# Patient Record
Sex: Male | Born: 1965 | Race: Black or African American | Hispanic: No | Marital: Single | Smoking: Current every day smoker
Health system: Southern US, Community
[De-identification: ages and names within clinical notes are randomized; demographics above are authoritative.]

## PROBLEM LIST (undated history)

## (undated) VITALS — BP 153/114 | HR 107 | Temp 98.1°F | Resp 20

## (undated) DIAGNOSIS — K219 Gastro-esophageal reflux disease without esophagitis: Secondary | ICD-10-CM

## (undated) DIAGNOSIS — K759 Inflammatory liver disease, unspecified: Secondary | ICD-10-CM

## (undated) DIAGNOSIS — G510 Bell's palsy: Secondary | ICD-10-CM

## (undated) DIAGNOSIS — E785 Hyperlipidemia, unspecified: Secondary | ICD-10-CM

## (undated) DIAGNOSIS — E860 Dehydration: Secondary | ICD-10-CM

## (undated) DIAGNOSIS — F209 Schizophrenia, unspecified: Secondary | ICD-10-CM

## (undated) DIAGNOSIS — N179 Acute kidney failure, unspecified: Secondary | ICD-10-CM

## (undated) DIAGNOSIS — F329 Major depressive disorder, single episode, unspecified: Secondary | ICD-10-CM

## (undated) DIAGNOSIS — J45909 Unspecified asthma, uncomplicated: Secondary | ICD-10-CM

## (undated) DIAGNOSIS — I10 Essential (primary) hypertension: Secondary | ICD-10-CM

## (undated) DIAGNOSIS — M1990 Unspecified osteoarthritis, unspecified site: Secondary | ICD-10-CM

## (undated) DIAGNOSIS — W3400XA Accidental discharge from unspecified firearms or gun, initial encounter: Secondary | ICD-10-CM

## (undated) DIAGNOSIS — F32A Depression, unspecified: Secondary | ICD-10-CM

## (undated) DIAGNOSIS — F319 Bipolar disorder, unspecified: Secondary | ICD-10-CM

## (undated) DIAGNOSIS — R112 Nausea with vomiting, unspecified: Secondary | ICD-10-CM

## (undated) DIAGNOSIS — S0990XA Unspecified injury of head, initial encounter: Secondary | ICD-10-CM

## (undated) DIAGNOSIS — T7840XA Allergy, unspecified, initial encounter: Secondary | ICD-10-CM

## (undated) DIAGNOSIS — F419 Anxiety disorder, unspecified: Secondary | ICD-10-CM

## (undated) DIAGNOSIS — G8929 Other chronic pain: Secondary | ICD-10-CM

## (undated) DIAGNOSIS — E119 Type 2 diabetes mellitus without complications: Secondary | ICD-10-CM

## (undated) HISTORY — PX: LEG SURGERY: SHX1003

## (undated) HISTORY — PX: HIP SURGERY: SHX245

## (undated) HISTORY — DX: Bell's palsy: G51.0

## (undated) HISTORY — DX: Gastro-esophageal reflux disease without esophagitis: K21.9

## (undated) HISTORY — DX: Acute kidney failure, unspecified: N17.9

## (undated) HISTORY — PX: COLONOSCOPY: SHX174

## (undated) HISTORY — DX: Nausea with vomiting, unspecified: R11.2

## (undated) HISTORY — DX: Type 2 diabetes mellitus without complications: E11.9

## (undated) HISTORY — DX: Allergy, unspecified, initial encounter: T78.40XA

## (undated) HISTORY — PX: HAND SURGERY: SHX662

## (undated) HISTORY — PX: ORTHOPEDIC SURGERY: SHX850

## (undated) HISTORY — DX: Dehydration: E86.0

---

## 1998-04-19 ENCOUNTER — Other Ambulatory Visit: Admission: RE | Admit: 1998-04-19 | Discharge: 1998-04-19 | Payer: Self-pay | Admitting: Orthopedic Surgery

## 1998-04-23 ENCOUNTER — Inpatient Hospital Stay (HOSPITAL_COMMUNITY): Admission: RE | Admit: 1998-04-23 | Discharge: 1998-04-26 | Payer: Self-pay | Admitting: Orthopedic Surgery

## 1998-07-08 ENCOUNTER — Emergency Department (HOSPITAL_COMMUNITY): Admission: EM | Admit: 1998-07-08 | Discharge: 1998-07-08 | Payer: Self-pay

## 2000-05-23 ENCOUNTER — Inpatient Hospital Stay (HOSPITAL_COMMUNITY): Admission: EM | Admit: 2000-05-23 | Discharge: 2000-05-29 | Payer: Self-pay

## 2000-09-16 ENCOUNTER — Inpatient Hospital Stay (HOSPITAL_COMMUNITY): Admission: EM | Admit: 2000-09-16 | Discharge: 2000-09-25 | Payer: Self-pay | Admitting: *Deleted

## 2000-10-22 ENCOUNTER — Emergency Department (HOSPITAL_COMMUNITY): Admission: EM | Admit: 2000-10-22 | Discharge: 2000-10-23 | Payer: Self-pay | Admitting: Emergency Medicine

## 2001-01-31 ENCOUNTER — Emergency Department (HOSPITAL_COMMUNITY): Admission: EM | Admit: 2001-01-31 | Discharge: 2001-01-31 | Payer: Self-pay | Admitting: Emergency Medicine

## 2001-03-01 ENCOUNTER — Emergency Department (HOSPITAL_COMMUNITY): Admission: EM | Admit: 2001-03-01 | Discharge: 2001-03-01 | Payer: Self-pay | Admitting: Emergency Medicine

## 2001-03-09 ENCOUNTER — Emergency Department (HOSPITAL_COMMUNITY): Admission: EM | Admit: 2001-03-09 | Discharge: 2001-03-09 | Payer: Self-pay | Admitting: *Deleted

## 2001-11-27 ENCOUNTER — Emergency Department (HOSPITAL_COMMUNITY): Admission: EM | Admit: 2001-11-27 | Discharge: 2001-11-27 | Payer: Self-pay | Admitting: Emergency Medicine

## 2001-12-13 ENCOUNTER — Emergency Department (HOSPITAL_COMMUNITY): Admission: EM | Admit: 2001-12-13 | Discharge: 2001-12-13 | Payer: Self-pay

## 2001-12-19 ENCOUNTER — Emergency Department (HOSPITAL_COMMUNITY): Admission: EM | Admit: 2001-12-19 | Discharge: 2001-12-19 | Payer: Self-pay | Admitting: Emergency Medicine

## 2002-01-19 ENCOUNTER — Emergency Department (HOSPITAL_COMMUNITY): Admission: EM | Admit: 2002-01-19 | Discharge: 2002-01-19 | Payer: Self-pay | Admitting: Emergency Medicine

## 2002-02-23 ENCOUNTER — Emergency Department (HOSPITAL_COMMUNITY): Admission: EM | Admit: 2002-02-23 | Discharge: 2002-02-23 | Payer: Self-pay | Admitting: Emergency Medicine

## 2002-03-31 ENCOUNTER — Encounter: Payer: Self-pay | Admitting: Emergency Medicine

## 2002-03-31 ENCOUNTER — Emergency Department (HOSPITAL_COMMUNITY): Admission: EM | Admit: 2002-03-31 | Discharge: 2002-03-31 | Payer: Self-pay

## 2002-04-05 ENCOUNTER — Emergency Department (HOSPITAL_COMMUNITY): Admission: EM | Admit: 2002-04-05 | Discharge: 2002-04-05 | Payer: Self-pay | Admitting: Emergency Medicine

## 2002-04-06 ENCOUNTER — Emergency Department (HOSPITAL_COMMUNITY): Admission: EM | Admit: 2002-04-06 | Discharge: 2002-04-06 | Payer: Self-pay | Admitting: Emergency Medicine

## 2002-04-07 ENCOUNTER — Emergency Department (HOSPITAL_COMMUNITY): Admission: EM | Admit: 2002-04-07 | Discharge: 2002-04-07 | Payer: Self-pay

## 2002-04-21 ENCOUNTER — Encounter
Admission: RE | Admit: 2002-04-21 | Discharge: 2002-06-13 | Payer: Self-pay | Admitting: Physical Medicine and Rehabilitation

## 2002-06-05 ENCOUNTER — Ambulatory Visit (HOSPITAL_COMMUNITY): Admission: RE | Admit: 2002-06-05 | Discharge: 2002-06-05 | Payer: Self-pay | Admitting: Orthopedic Surgery

## 2002-06-10 ENCOUNTER — Encounter: Payer: Self-pay | Admitting: Emergency Medicine

## 2002-06-10 ENCOUNTER — Emergency Department (HOSPITAL_COMMUNITY): Admission: EM | Admit: 2002-06-10 | Discharge: 2002-06-11 | Payer: Self-pay | Admitting: Emergency Medicine

## 2002-08-25 ENCOUNTER — Emergency Department (HOSPITAL_COMMUNITY): Admission: EM | Admit: 2002-08-25 | Discharge: 2002-08-25 | Payer: Self-pay | Admitting: *Deleted

## 2002-09-10 ENCOUNTER — Emergency Department (HOSPITAL_COMMUNITY): Admission: EM | Admit: 2002-09-10 | Discharge: 2002-09-10 | Payer: Self-pay | Admitting: Emergency Medicine

## 2002-09-18 ENCOUNTER — Emergency Department (HOSPITAL_COMMUNITY): Admission: EM | Admit: 2002-09-18 | Discharge: 2002-09-18 | Payer: Self-pay | Admitting: Emergency Medicine

## 2002-10-09 ENCOUNTER — Encounter: Admission: RE | Admit: 2002-10-09 | Discharge: 2003-01-07 | Payer: Self-pay

## 2002-11-11 ENCOUNTER — Emergency Department (HOSPITAL_COMMUNITY): Admission: EM | Admit: 2002-11-11 | Discharge: 2002-11-11 | Payer: Self-pay | Admitting: Emergency Medicine

## 2002-11-11 ENCOUNTER — Encounter: Payer: Self-pay | Admitting: Emergency Medicine

## 2002-11-12 ENCOUNTER — Ambulatory Visit (HOSPITAL_BASED_OUTPATIENT_CLINIC_OR_DEPARTMENT_OTHER): Admission: RE | Admit: 2002-11-12 | Discharge: 2002-11-12 | Payer: Self-pay | Admitting: *Deleted

## 2002-11-15 ENCOUNTER — Emergency Department (HOSPITAL_COMMUNITY): Admission: EM | Admit: 2002-11-15 | Discharge: 2002-11-15 | Payer: Self-pay | Admitting: Emergency Medicine

## 2002-12-03 ENCOUNTER — Emergency Department (HOSPITAL_COMMUNITY): Admission: EM | Admit: 2002-12-03 | Discharge: 2002-12-03 | Payer: Self-pay | Admitting: Emergency Medicine

## 2002-12-06 ENCOUNTER — Emergency Department (HOSPITAL_COMMUNITY): Admission: EM | Admit: 2002-12-06 | Discharge: 2002-12-06 | Payer: Self-pay | Admitting: Emergency Medicine

## 2002-12-06 ENCOUNTER — Encounter: Payer: Self-pay | Admitting: Emergency Medicine

## 2002-12-10 ENCOUNTER — Ambulatory Visit (HOSPITAL_BASED_OUTPATIENT_CLINIC_OR_DEPARTMENT_OTHER): Admission: RE | Admit: 2002-12-10 | Discharge: 2002-12-10 | Payer: Self-pay | Admitting: *Deleted

## 2002-12-15 ENCOUNTER — Emergency Department (HOSPITAL_COMMUNITY): Admission: EM | Admit: 2002-12-15 | Discharge: 2002-12-15 | Payer: Self-pay

## 2002-12-19 ENCOUNTER — Emergency Department (HOSPITAL_COMMUNITY): Admission: EM | Admit: 2002-12-19 | Discharge: 2002-12-19 | Payer: Self-pay | Admitting: Emergency Medicine

## 2002-12-30 ENCOUNTER — Emergency Department (HOSPITAL_COMMUNITY): Admission: EM | Admit: 2002-12-30 | Discharge: 2002-12-30 | Payer: Self-pay | Admitting: Emergency Medicine

## 2003-01-05 ENCOUNTER — Emergency Department (HOSPITAL_COMMUNITY): Admission: EM | Admit: 2003-01-05 | Discharge: 2003-01-05 | Payer: Self-pay | Admitting: Emergency Medicine

## 2003-01-30 ENCOUNTER — Emergency Department (HOSPITAL_COMMUNITY): Admission: EM | Admit: 2003-01-30 | Discharge: 2003-01-30 | Payer: Self-pay | Admitting: Emergency Medicine

## 2003-02-16 ENCOUNTER — Emergency Department (HOSPITAL_COMMUNITY): Admission: EM | Admit: 2003-02-16 | Discharge: 2003-02-17 | Payer: Self-pay | Admitting: Emergency Medicine

## 2003-03-04 ENCOUNTER — Emergency Department (HOSPITAL_COMMUNITY): Admission: EM | Admit: 2003-03-04 | Discharge: 2003-03-05 | Payer: Self-pay | Admitting: Emergency Medicine

## 2003-03-05 ENCOUNTER — Encounter: Payer: Self-pay | Admitting: *Deleted

## 2003-03-06 ENCOUNTER — Emergency Department (HOSPITAL_COMMUNITY): Admission: EM | Admit: 2003-03-06 | Discharge: 2003-03-06 | Payer: Self-pay | Admitting: Emergency Medicine

## 2003-05-06 ENCOUNTER — Encounter: Payer: Self-pay | Admitting: Emergency Medicine

## 2003-05-06 ENCOUNTER — Emergency Department (HOSPITAL_COMMUNITY): Admission: EM | Admit: 2003-05-06 | Discharge: 2003-05-06 | Payer: Self-pay | Admitting: Emergency Medicine

## 2003-05-09 ENCOUNTER — Emergency Department (HOSPITAL_COMMUNITY): Admission: EM | Admit: 2003-05-09 | Discharge: 2003-05-09 | Payer: Self-pay | Admitting: Emergency Medicine

## 2003-06-14 ENCOUNTER — Emergency Department (HOSPITAL_COMMUNITY): Admission: EM | Admit: 2003-06-14 | Discharge: 2003-06-14 | Payer: Self-pay | Admitting: Emergency Medicine

## 2003-07-12 ENCOUNTER — Emergency Department (HOSPITAL_COMMUNITY): Admission: EM | Admit: 2003-07-12 | Discharge: 2003-07-12 | Payer: Self-pay | Admitting: Emergency Medicine

## 2003-07-12 ENCOUNTER — Encounter: Payer: Self-pay | Admitting: Emergency Medicine

## 2003-07-13 ENCOUNTER — Encounter: Payer: Self-pay | Admitting: Emergency Medicine

## 2003-07-13 ENCOUNTER — Emergency Department (HOSPITAL_COMMUNITY): Admission: EM | Admit: 2003-07-13 | Discharge: 2003-07-13 | Payer: Self-pay | Admitting: Emergency Medicine

## 2003-09-04 ENCOUNTER — Emergency Department (HOSPITAL_COMMUNITY): Admission: EM | Admit: 2003-09-04 | Discharge: 2003-09-04 | Payer: Self-pay | Admitting: Emergency Medicine

## 2003-09-16 ENCOUNTER — Encounter: Payer: Self-pay | Admitting: Emergency Medicine

## 2003-09-16 ENCOUNTER — Emergency Department (HOSPITAL_COMMUNITY): Admission: EM | Admit: 2003-09-16 | Discharge: 2003-09-16 | Payer: Self-pay | Admitting: Emergency Medicine

## 2003-10-04 ENCOUNTER — Encounter: Payer: Self-pay | Admitting: Family Medicine

## 2003-10-04 ENCOUNTER — Emergency Department (HOSPITAL_COMMUNITY): Admission: EM | Admit: 2003-10-04 | Discharge: 2003-10-04 | Payer: Self-pay | Admitting: Emergency Medicine

## 2003-10-17 ENCOUNTER — Emergency Department (HOSPITAL_COMMUNITY): Admission: EM | Admit: 2003-10-17 | Discharge: 2003-10-17 | Payer: Self-pay | Admitting: Emergency Medicine

## 2003-12-04 ENCOUNTER — Emergency Department (HOSPITAL_COMMUNITY): Admission: AD | Admit: 2003-12-04 | Discharge: 2003-12-04 | Payer: Self-pay | Admitting: Family Medicine

## 2004-01-23 ENCOUNTER — Emergency Department (HOSPITAL_COMMUNITY): Admission: EM | Admit: 2004-01-23 | Discharge: 2004-01-23 | Payer: Self-pay | Admitting: Emergency Medicine

## 2004-01-31 ENCOUNTER — Emergency Department (HOSPITAL_COMMUNITY): Admission: EM | Admit: 2004-01-31 | Discharge: 2004-01-31 | Payer: Self-pay | Admitting: Emergency Medicine

## 2004-02-22 ENCOUNTER — Emergency Department (HOSPITAL_COMMUNITY): Admission: EM | Admit: 2004-02-22 | Discharge: 2004-02-22 | Payer: Self-pay | Admitting: Emergency Medicine

## 2004-06-12 ENCOUNTER — Emergency Department (HOSPITAL_COMMUNITY): Admission: EM | Admit: 2004-06-12 | Discharge: 2004-06-12 | Payer: Self-pay | Admitting: Family Medicine

## 2004-07-25 ENCOUNTER — Emergency Department (HOSPITAL_COMMUNITY): Admission: EM | Admit: 2004-07-25 | Discharge: 2004-07-25 | Payer: Self-pay | Admitting: Emergency Medicine

## 2004-09-06 ENCOUNTER — Ambulatory Visit: Payer: Self-pay | Admitting: Internal Medicine

## 2004-10-31 ENCOUNTER — Emergency Department (HOSPITAL_COMMUNITY): Admission: EM | Admit: 2004-10-31 | Discharge: 2004-10-31 | Payer: Self-pay | Admitting: Emergency Medicine

## 2004-12-28 ENCOUNTER — Emergency Department (HOSPITAL_COMMUNITY): Admission: EM | Admit: 2004-12-28 | Discharge: 2004-12-28 | Payer: Self-pay | Admitting: Emergency Medicine

## 2005-01-10 ENCOUNTER — Emergency Department (HOSPITAL_COMMUNITY): Admission: EM | Admit: 2005-01-10 | Discharge: 2005-01-10 | Payer: Self-pay | Admitting: Emergency Medicine

## 2005-01-15 ENCOUNTER — Emergency Department (HOSPITAL_COMMUNITY): Admission: EM | Admit: 2005-01-15 | Discharge: 2005-01-15 | Payer: Self-pay | Admitting: Emergency Medicine

## 2005-02-02 ENCOUNTER — Ambulatory Visit: Payer: Self-pay | Admitting: Family Medicine

## 2005-03-14 ENCOUNTER — Ambulatory Visit: Payer: Self-pay | Admitting: Internal Medicine

## 2005-03-21 ENCOUNTER — Ambulatory Visit: Payer: Self-pay | Admitting: Internal Medicine

## 2005-04-05 ENCOUNTER — Ambulatory Visit: Payer: Self-pay | Admitting: Internal Medicine

## 2005-06-02 ENCOUNTER — Emergency Department (HOSPITAL_COMMUNITY): Admission: EM | Admit: 2005-06-02 | Discharge: 2005-06-02 | Payer: Self-pay | Admitting: Emergency Medicine

## 2005-06-06 ENCOUNTER — Emergency Department (HOSPITAL_COMMUNITY): Admission: EM | Admit: 2005-06-06 | Discharge: 2005-06-06 | Payer: Self-pay | Admitting: Emergency Medicine

## 2005-06-29 ENCOUNTER — Ambulatory Visit: Payer: Self-pay | Admitting: Internal Medicine

## 2005-07-02 ENCOUNTER — Emergency Department (HOSPITAL_COMMUNITY): Admission: EM | Admit: 2005-07-02 | Discharge: 2005-07-02 | Payer: Self-pay | Admitting: Emergency Medicine

## 2005-08-17 ENCOUNTER — Ambulatory Visit (HOSPITAL_BASED_OUTPATIENT_CLINIC_OR_DEPARTMENT_OTHER): Admission: RE | Admit: 2005-08-17 | Discharge: 2005-08-17 | Payer: Self-pay | Admitting: Orthopedic Surgery

## 2005-08-17 ENCOUNTER — Ambulatory Visit (HOSPITAL_COMMUNITY): Admission: RE | Admit: 2005-08-17 | Discharge: 2005-08-17 | Payer: Self-pay | Admitting: Orthopedic Surgery

## 2005-08-26 ENCOUNTER — Emergency Department (HOSPITAL_COMMUNITY): Admission: EM | Admit: 2005-08-26 | Discharge: 2005-08-26 | Payer: Self-pay | Admitting: Emergency Medicine

## 2005-08-28 ENCOUNTER — Emergency Department (HOSPITAL_COMMUNITY): Admission: EM | Admit: 2005-08-28 | Discharge: 2005-08-28 | Payer: Self-pay | Admitting: Emergency Medicine

## 2005-08-29 ENCOUNTER — Emergency Department (HOSPITAL_COMMUNITY): Admission: EM | Admit: 2005-08-29 | Discharge: 2005-08-29 | Payer: Self-pay | Admitting: Emergency Medicine

## 2005-12-04 ENCOUNTER — Ambulatory Visit: Payer: Self-pay | Admitting: Internal Medicine

## 2006-01-26 ENCOUNTER — Emergency Department (HOSPITAL_COMMUNITY): Admission: EM | Admit: 2006-01-26 | Discharge: 2006-01-26 | Payer: Self-pay | Admitting: Emergency Medicine

## 2006-03-30 ENCOUNTER — Ambulatory Visit: Payer: Self-pay | Admitting: Family Medicine

## 2006-05-14 ENCOUNTER — Emergency Department (HOSPITAL_COMMUNITY): Admission: EM | Admit: 2006-05-14 | Discharge: 2006-05-14 | Payer: Self-pay | Admitting: Emergency Medicine

## 2007-02-12 ENCOUNTER — Emergency Department (HOSPITAL_COMMUNITY): Admission: EM | Admit: 2007-02-12 | Discharge: 2007-02-12 | Payer: Self-pay | Admitting: Emergency Medicine

## 2007-02-17 ENCOUNTER — Inpatient Hospital Stay (HOSPITAL_COMMUNITY): Admission: EM | Admit: 2007-02-17 | Discharge: 2007-02-22 | Payer: Self-pay | Admitting: Emergency Medicine

## 2007-07-11 ENCOUNTER — Ambulatory Visit: Payer: Self-pay | Admitting: Internal Medicine

## 2007-09-02 DIAGNOSIS — M545 Low back pain, unspecified: Secondary | ICD-10-CM | POA: Insufficient documentation

## 2007-09-02 DIAGNOSIS — F1110 Opioid abuse, uncomplicated: Secondary | ICD-10-CM | POA: Insufficient documentation

## 2007-09-02 DIAGNOSIS — F209 Schizophrenia, unspecified: Secondary | ICD-10-CM | POA: Insufficient documentation

## 2007-09-02 DIAGNOSIS — K219 Gastro-esophageal reflux disease without esophagitis: Secondary | ICD-10-CM | POA: Insufficient documentation

## 2007-09-02 DIAGNOSIS — I10 Essential (primary) hypertension: Secondary | ICD-10-CM | POA: Insufficient documentation

## 2007-09-02 DIAGNOSIS — G8929 Other chronic pain: Secondary | ICD-10-CM | POA: Insufficient documentation

## 2007-12-04 ENCOUNTER — Emergency Department (HOSPITAL_COMMUNITY): Admission: EM | Admit: 2007-12-04 | Discharge: 2007-12-04 | Payer: Self-pay | Admitting: Emergency Medicine

## 2008-06-16 ENCOUNTER — Emergency Department (HOSPITAL_COMMUNITY): Admission: EM | Admit: 2008-06-16 | Discharge: 2008-06-16 | Payer: Self-pay | Admitting: Family Medicine

## 2008-09-05 ENCOUNTER — Emergency Department (HOSPITAL_COMMUNITY): Admission: EM | Admit: 2008-09-05 | Discharge: 2008-09-05 | Payer: Self-pay | Admitting: Family Medicine

## 2008-12-22 ENCOUNTER — Emergency Department (HOSPITAL_COMMUNITY): Admission: EM | Admit: 2008-12-22 | Discharge: 2008-12-22 | Payer: Self-pay | Admitting: Family Medicine

## 2009-03-17 ENCOUNTER — Emergency Department (HOSPITAL_COMMUNITY): Admission: EM | Admit: 2009-03-17 | Discharge: 2009-03-17 | Payer: Self-pay | Admitting: Family Medicine

## 2009-10-12 ENCOUNTER — Emergency Department (HOSPITAL_COMMUNITY): Admission: EM | Admit: 2009-10-12 | Discharge: 2009-10-12 | Payer: Self-pay | Admitting: Emergency Medicine

## 2010-06-15 ENCOUNTER — Emergency Department (HOSPITAL_COMMUNITY): Admission: EM | Admit: 2010-06-15 | Discharge: 2010-06-15 | Payer: Self-pay | Admitting: Family Medicine

## 2010-07-23 ENCOUNTER — Emergency Department (HOSPITAL_COMMUNITY): Admission: EM | Admit: 2010-07-23 | Discharge: 2010-07-23 | Payer: Self-pay | Admitting: Internal Medicine

## 2010-10-18 ENCOUNTER — Emergency Department (HOSPITAL_COMMUNITY): Admission: EM | Admit: 2010-10-18 | Discharge: 2010-10-18 | Payer: Self-pay | Admitting: Emergency Medicine

## 2010-12-25 ENCOUNTER — Emergency Department (HOSPITAL_COMMUNITY)
Admission: EM | Admit: 2010-12-25 | Discharge: 2010-12-25 | Payer: Self-pay | Source: Home / Self Care | Admitting: Emergency Medicine

## 2011-04-15 ENCOUNTER — Inpatient Hospital Stay (INDEPENDENT_AMBULATORY_CARE_PROVIDER_SITE_OTHER)
Admission: RE | Admit: 2011-04-15 | Discharge: 2011-04-15 | Disposition: A | Discharge status: Home / Self Care | Payer: Medicare Other | Source: Ambulatory Visit | Attending: Family Medicine | Admitting: Family Medicine

## 2011-04-15 DIAGNOSIS — B029 Zoster without complications: Secondary | ICD-10-CM

## 2011-05-12 NOTE — Discharge Summary (Signed)
NAMEMOE, YONAMINE           ACCOUNT NO.:  [id number]   MEDICAL RECORD NO.:  [id number]          PATIENT TYPE:  INP   LOCATION:  5002                         FACILITY:  [facility]   PHYSICIAN:  Newt Minion, MD     DATE OF BIRTH:  [birth year]   DATE OF ADMISSION:  02/17/2007  DATE OF DISCHARGE:  02/22/2007                               DISCHARGE SUMMARY   FINAL DIAGNOSIS:  Methicillin-resistant Staphylococcus aureus abscess,  community-acquired, dorsal aspect right hand.   PROCEDURE:  1. Irrigation and debridement of abscess.  2. Placement of IV vancomycin antibiotics.   Discharged to home in stable condition with prescriptions for Tylox for  pain, doxycycline to continue with the antibiotic coverage, and  instructions for wound care.   HISTORY OF PRESENT ILLNESS:  The patient is a 45 year old gentleman who  developed an abscess on the dorsal aspect of his right hand, unknown  mechanism.  He denied fighting but was in the emergency room several  days ago due the same infection in his hand.  He was given a  prescription for Keflex but did not fill this.  He presented at this  time with a massive abscess with necrotic skin dorsally on the right  hand without involvement of the flexor tendons.  The patient was  urgently brought to the operating room and underwent irrigation and  debridement of the massive abscess to his right hand.  Postoperatively,  the patient was placed on clindamycin and Zosyn.  He was started with  occupational therapy with pulse lavage daily to the right hand.  Cultures were pending.  He did receive a psych consult due to suicidal  ideation.  His drug screen was positive for benzodiazepines, cocaine and  opiates.  The patient's wound progressed nicely.  His cultures were  ultimately positive for MRSA and the patient was changed from his  current medications to IV vancomycin.  He was kept on IV vancomycin  throughout his hospital stay and then was discharged  to home with  doxycycline with followup in the office in 2 weeks.      Newt Minion, MD  Electronically Signed     MVD/MEDQ  D:  04/11/2007  T:  04/11/2007  Job:  [id number]
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
## 2011-05-12 NOTE — Op Note (Signed)
[facility]  Patient:    Jacob [name], Jacob [name] Visit Number: [id number] MRN: [id number]          Service Type: DSU Location: DAY Attending Physician:  Augustin Schooling. Dictated by:   Esmond Plants, M.D. Proc. Date: 06/05/02 Admit Date:  06/05/2002 Discharge Date: 06/05/2002                             Operative Report  PREOPERATIVE DIAGNOSIS:  Left knee anterior horn lateral meniscus tear.  POSTOPERATIVE DIAGNOSIS:  Left knee anterior horn lateral meniscus tear.  PROCEDURE:  Left knee diagnostic arthroscopy with debridement of anterior horn lateral meniscus tear.  SURGEON:  Esmond Plants, M.D.  FIRST ASSISTANT:  None.  ANESTHESIA:  Local anesthesia plus MAC.  ESTIMATED BLOOD LOSS:  Zero.  TOURNIQUET TIME:  Zero.  FLUID REPLACED:  800 cc crystalloid.  INSTRUMENT COUNT:  Correct.  COMPLICATIONS:  None.  Perioperative antibiotics were given.  INDICATIONS FOR PROCEDURE:  The patient is a 45 year old male who presents complaining of anterolateral left knee pain. The patient denies history of specific trauma related to the knee. He has failed a conservative course of outpatient physical therapy and presents with signs and symptoms consistent with a lateral meniscus tear and MRI which shows an anterior horn tear. After counseling the patient regarding the options for management to include continued conservative management versus surgical evaluation and treatment, he would like to proceed with surgery. Informed consent was signed and on the chart.  DESCRIPTION OF PROCEDURE:  After an adequate level of anesthesia was achieved and 1 gm of Ancef was given preoperatively, the patient was positioned supine on the operating room table, a nonsterile tourniquet was placed on the left proximal thigh. The left leg was then prepped and draped in its entirety in the usual sterile fashion. A lateral post was utilized. Diagnostic arthroscopy was  performed through standard arthroscopic portals, superolateral outflow, anterolateral scope, and anteromedial working portals were all created in a similar fashion with infiltration of 0.5% Marcaine with epinephrine followed by incision with an 11 blade scalpel and introduced of the cannula into the joint using blunt obturators. Diagnostic arthroscopy revealed normal patellofemoral chondral surfaces. The medial and lateral gutters were free of loose bodies. There was no medial plica noted. The popliteus was visualized and noted to be intact. The scope was then entered into the medial compartment and there was noted to be no chondromalacia noted on the femoral condyle. The tibial cartilage had some grade 1 softening. The medial meniscus was normal in its entirety. The ACL was visualized and noted to be intact. There was hypertrophic synovium present within the notch which was debrided including the ligamentum mucosum. At this point, the scope was able to visualize a complex anterior horn lateral meniscus tear. This was debrided back to stable meniscal tissue using biting instruments and a full range resector. Approximately half the anterior horn of the lateral meniscus had to be removed. The remainder of the posterior horn and meniscus was intact. There was some grade 2 and grade 3 chondromalacia noted on the lateral femoral condyle and the lateral tibial plateau. There was no evidence of full thickness wear and there was still felt to be an adequate thickness of cartilage in that compartment. The ACL was also visualized and noted to be intact. After completion of the lateral meniscal debridement, the knee was thoroughly irrigated and then the wound was closed using 4-0 monocryl, Steri-Strips  were applied followed by a sterile dressing. The patient tolerated the procedure well and was taken to PACU in stable condition. Dictated by:   Esmond Plants, M.D. Attending Physician:  Esmond Plants  R. DD:  06/05/02 TD:  06/07/02 Job: [id number]
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
## 2011-05-12 NOTE — Op Note (Signed)
NAME:  Jacob [name], Jacob [name]                     ACCOUNT NO.:  [id number]   MEDICAL RECORD NO.:  [id number]                   PATIENT TYPE:  AMB   LOCATION:  DSC                                  FACILITY:  [facility]   PHYSICIAN:  Daryl Eastern, M.D.      DATE OF BIRTH:  [birth year]   DATE OF PROCEDURE:  DATE OF DISCHARGE:                                 OPERATIVE REPORT   PREOPERATIVE DIAGNOSIS:  Comminuted intra-articular fracture, base of the  left first metacarpal.   POSTOPERATIVE DIAGNOSIS:  Comminuted intra-articular fracture, base of the  left first metacarpal.   OPERATION PERFORMED:  Open reduction and internal fixation, left first  metacarpal.   SURGEON:  Daryl Eastern, M.D.   ANESTHESIA:  General.   OPERATIVE FINDINGS:  The patient had significant callous formation with  malunited fracture.  The fracture was four weeks old.   DESCRIPTION OF OPERATION:  Under general anesthesia with a tourniquet on the  left arm, the left hand was prepped and draped in the usual fashion.  After  exsanguinating the limb the tourniquet was inflated to 250 mmHg.  A  longitudinal incision was made along the radial border of the first  metacarpal and then extended transversely over the distal wrist crease.  Sharp dissection was carried through the subcutaneous tissues and bleeding  points were coagulated.  Sharp dissection was carried down just radial to  the APL tendon and the thenar muscles were elevated from the base of the  first metacarpal.  A fire spring retractor was inserted for retraction.   The fracture site was identified and the [facility] joint was opened.  There was a  small defect on the trapezium.  The articular surface basically was smooth,  except for the small defect.  The fracture site was then taken down with a  Soil scientist and involved a significant position of the articular surface.  The fracture was then reduced to the first metacarpal realigning the  trapezium metacarpal joint.  There was an ulnar fragment that was involving  about 10% of the articular surface.  This fragment was reduced to the  fracture fragment and held with a temporary K wire and then the main  fragment was reduced to the shaft of the first metacarpal, and held with a  clamp.  The mini fragment set was used and the large fragment was repaired  to the shaft with 2.5 mm screw times two, lagging it from radial to ulnar.  This showed good alignment under x-ray control.  The articular fragment was  then again reduced to make the joint surface smooth and a 28 K wire was  passed from ulnar to radial across the fragment and out through the skin.  The pin was bent over and left protruding from the skin.  X-ray showed good  alignment.   The wound was irrigated copiously with saline.  The muscle and fascia were  repaired with 4-0 Vicryl.  The subcutaneous tissue was  closed with 4-0  Vicryl and the skin with a 3-0 subcuticular Prolene.  Steri-strips were  applied followed by sterile dressings and a thumb spica splint.  The  tourniquet was released with good circulation of the hand.   The patient went to the recovery room awake in stable and good condition.                                                 Daryl Eastern, M.D.    EMM/MEDQ  D:  12/10/2002  T:  12/11/2002  Job:  [id number]
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
## 2011-05-12 NOTE — Discharge Summary (Signed)
[facility]  Patient:    Jacob [name], Jacob [name]                    MRN: [id number] Adm. Date:  [id number] Disc. Date: [id number] Attending:  Bess Harvest                           Discharge Summary  INTRODUCTION:  Jachai Kenner is a 45 year old black male who was admitted on voluntary papers for detoxification from alcohol.  He was also complaining of hearing voices telling him to rob people.  The patient has a long history of hallucinatory experience and almost continuous substance abuse for years. he has also a history of antisocial behavior.  HOSPITAL COURSE:  After admission to the ward the patient was started on low dose Librium protocol.  He was started on Zyprexa 5 mg in the morning, 10 mg at bedtime and Neurontin 400 mg in the morning and 800 mg at bedtime.  On September 17, 2000 I added Celexa because of some depressive symptoms, 10 mg daily.  Dr. Lajuana Ripple who saw the patient on call started him on Remeron at bedtime because of insomnia and started him on Depakote ER 1000 mg at bedtime with valproic acid level scheduled September 20, 2000.  He was still psychotic on September 18, 2000 and hearing voices.  Zyprexa was increased to 20 mg daily and Geodon was introduced 20 mg in the morning and 40 mg at bedtime. The patient did not tolerate Geodon in this dose well and Zyprexa was subsequently increased to 30 mg daily and Geodon decreased to 20 mg at bedtime.  Because of breaks in anxiety I started him on Klonopin.  The patient was started on September 21, 2000 on Procardia because of elevation of blood pressure.  At one point the patient started developing threats to other patients, going to other patients rooms and stealing things.  He was placed on one-to-one observation.  The patient complained that Depakote made him confused and Dr. Dola Argyle, who was on call discontinued his Depakote.  On September 24, 2000 the patients Neurontin was restarrted and  I started him on Zoloft 25 mg daily.  I discontinued the Librium and discontinued one-to-one observation. On September 25, 2000 the patient felt free from hallucination, delusions or dangerous ideations and was able to promise safety and sobriety.  His girlfriend was able to take him home.  He claimed responsibility for his stealing on the unit.  There were no side effects on medication.  MEDICAL PROBLEMS:  As mentioned before, the patient ran an elevated blood pressure which was helped with hydrochlorothiazide.  His blood pressure on the day of discharge was 142/80.  Other vital signs were stable.  LABORATORY DATA:  Review of blood work showed valproic acid on September 20, 2000 50, normal level of folate and B12 and normal CBC and differential and slight elevation of thyroid uptake at 46.9 with normal TSH and T4.  DISCHARGE DIAGNOSES: Axis I:    1. Schizoaffective disorder.            2. Alcohol dependence.            3. Polysubstance abuse. 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 Axis III:  1. Hypertension.            2. Upper respiratory tract infection, improved.  Axis IV:   Psychosocial stressors severe, medical problems and social            problems as well as substance abuse. Axis V:    Global assessment of functioning upon admission 35, maximum for            past year 60; global assessment of functioning upon discharge 50.  DISCHARGE RECOMMENDATION:  The patient was discharged to [facility] residential treatment program and he is supposed to have an appointment with mental health after discharge from the program.  DISCHARGE MEDICATIONS:  Zyprexa 2 tablets at bedtime, Zyprexa 1 table tin the morning and 1 tablet at noon, Neurontin 100 mg three times a day, Zoloft 25 mg daily, Remeron 15 mg at bedtime, Celexa 40 mg daily, hydrochlorothiazide 25 mg daily, Klonopin 0.5 mg twice a day, Geodon 20 mg at bedtime.  CONDITION ON  DISCHARGE:  The patient was discharged in good condition and prior to discharge he was instructed about side effects of medication. DD:  10/07/00 TD:  10/08/00 Job: [id number]
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
## 2011-05-12 NOTE — Op Note (Signed)
Jacob [name], Jacob [name]           ACCOUNT NO.:  [id number]   MEDICAL RECORD NO.:  [id number]          PATIENT TYPE:  INP   LOCATION:  5002                         FACILITY:  [facility]   PHYSICIAN:  Newt Minion, MD     DATE OF BIRTH:  07/18/1966   DATE OF PROCEDURE:  02/17/2007  DATE OF DISCHARGE:                               OPERATIVE REPORT   PREOPERATIVE DIAGNOSIS:  Abscess, dorsal right hand.   POSTOPERATIVE DIAGNOSIS:  Abscess, dorsal right hand.   PROCEDURE:  Irrigation and debridement, with debridement of necrotic  extensor tendons right hand.   SURGEON.:  Newt Minion, MD   ANESTHESIA:  Local with 15 mL of 2% lidocaine plain.   DRAINS:  Wound wicked open   COMPLICATIONS:  None.   DISPOSITION:  To the 5th Floor orthopedics for IV antibiotics.  The  patient's cultures were obtained x2 with aerobic and  anaerobic  cultures.   INDICATIONS FOR PROCEDURE:  The patient is a 45 year old gentleman who  states he was in the hospital several days ago at the emergency room  with a small wound on the dorsum of his right hand.  He was given a  prescription for Keflex antibiotics which he did not fill.  He presents  at this time with a massive necrotic, purulent abscess at the dorsum of  the right hand.  The patient was seen in evaluation for treatment and  admission through the emergency room.   DESCRIPTION OF PROCEDURE:  The patient's right lower extremity was  prepped using Betadine and draped into a sterile field.  A local block  was obtained with 15 mL of 2% lidocaine plain.  After adequate level of  anesthesia obtained, a midline incision was made from the long finger  MCP joint to just proximal to the dorsal wrist flexor crease.  There was  a large purulent abscess which was encountered, and cultures were  obtained, (both anaerobic and aerobic) prior to obtaining antibiotics.  This was irrigated with normal saline with essential pulse lavage type  irrigation.  There  were necrotic extensor tendons which were debrided.  There was large amounts of necrotic material, and this was also  debrided.  After debriding back to bleeding viable tissue, the wound was  wicked open with Kerlix and a sterile dressing was applied.   PLAN:  Admission; placement of IV antibiotics with Zosyn and  clindamycin.  Blue-arm elevator, OT, pulse lavage therapy daily.  Will  adjust antibiotics once the cultures are obtained.      Newt Minion, MD  Electronically Signed     MVD/MEDQ  D:  02/17/2007  T:  02/17/2007  Job:  [id number]

## 2011-05-12 NOTE — Op Note (Signed)
   NAME:  Jacob [name], Jacob [name]                     ACCOUNT NO.:  [id number]   MEDICAL RECORD NO.:  [id number]                   PATIENT TYPE:  AMB   LOCATION:  DSC                                  FACILITY:  [facility]   PHYSICIAN:  Daryl Eastern, M.D.      DATE OF BIRTH:  22-Mar-1966   DATE OF PROCEDURE:  11/12/2002  DATE OF DISCHARGE:                                 OPERATIVE REPORT   PREOPERATIVE DIAGNOSIS:  Bennett fracture dislocation, left thumb.   POSTOPERATIVE DIAGNOSIS:  Bennett fracture dislocation, left thumb.   PROCEDURE:  Closed reduction and percutaneous pinning of the left thumb  Bennett's fracture.   SURGEON:  Daryl Eastern, M.D.   ANESTHESIA:  General.   OPERATIVE FINDINGS:  The patient had an unstable fracture to the base of the  first metacarpal with a subluxation of the [facility] joint.   DESCRIPTION OF PROCEDURE:  Under general anesthesia the left hand was  prepped and draped in the usual fashion and a closed reduction was performed  on the thumb under x-ray control. A 4-5 K-wire was placed percutaneously  across the fracture dislocation from the  metacarpal into the trapezium. X-  rays showed good alignment and a second K-wire was placed parallel to the  first for rotational control. X-rays were taken and showed good position.  The K-wires were bent over and left protruding from the skin. Sterile  dressings were applied followed by a thumb spica splint.   The patient tolerated the procedure well. He went to the recovery room awake  in stable and good condition.                                               Daryl Eastern, M.D.    EMM/MEDQ  D:  11/12/2002  T:  11/12/2002  Job:  [id number]

## 2011-05-22 ENCOUNTER — Emergency Department (HOSPITAL_COMMUNITY)
Admission: EM | Admit: 2011-05-22 | Discharge: 2011-05-22 | Disposition: A | Discharge status: Home / Self Care | Payer: Medicare Other | Attending: Emergency Medicine | Admitting: Emergency Medicine

## 2011-05-22 DIAGNOSIS — Z862 Personal history of diseases of the blood and blood-forming organs and certain disorders involving the immune mechanism: Secondary | ICD-10-CM | POA: Insufficient documentation

## 2011-05-22 DIAGNOSIS — G8929 Other chronic pain: Secondary | ICD-10-CM | POA: Insufficient documentation

## 2011-05-22 DIAGNOSIS — I10 Essential (primary) hypertension: Secondary | ICD-10-CM | POA: Insufficient documentation

## 2011-05-22 DIAGNOSIS — Z8639 Personal history of other endocrine, nutritional and metabolic disease: Secondary | ICD-10-CM | POA: Insufficient documentation

## 2011-05-22 DIAGNOSIS — Z79899 Other long term (current) drug therapy: Secondary | ICD-10-CM | POA: Insufficient documentation

## 2011-05-22 DIAGNOSIS — Z9889 Other specified postprocedural states: Secondary | ICD-10-CM | POA: Insufficient documentation

## 2011-05-23 ENCOUNTER — Emergency Department (HOSPITAL_COMMUNITY)
Admission: EM | Admit: 2011-05-23 | Discharge: 2011-05-23 | Disposition: A | Discharge status: Home / Self Care | Payer: Medicare Other | Attending: Emergency Medicine | Admitting: Emergency Medicine

## 2011-05-23 DIAGNOSIS — M109 Gout, unspecified: Secondary | ICD-10-CM | POA: Insufficient documentation

## 2011-05-23 DIAGNOSIS — Z8659 Personal history of other mental and behavioral disorders: Secondary | ICD-10-CM | POA: Insufficient documentation

## 2011-05-23 DIAGNOSIS — I10 Essential (primary) hypertension: Secondary | ICD-10-CM | POA: Insufficient documentation

## 2011-05-23 DIAGNOSIS — M25469 Effusion, unspecified knee: Secondary | ICD-10-CM | POA: Insufficient documentation

## 2011-05-23 DIAGNOSIS — M2550 Pain in unspecified joint: Secondary | ICD-10-CM | POA: Insufficient documentation

## 2011-05-23 DIAGNOSIS — G8929 Other chronic pain: Secondary | ICD-10-CM | POA: Insufficient documentation

## 2011-06-04 ENCOUNTER — Emergency Department (HOSPITAL_COMMUNITY)
Admission: EM | Admit: 2011-06-04 | Discharge: 2011-06-04 | Disposition: A | Discharge status: Home / Self Care | Payer: Medicare Other | Attending: Emergency Medicine | Admitting: Emergency Medicine

## 2011-06-04 DIAGNOSIS — I10 Essential (primary) hypertension: Secondary | ICD-10-CM | POA: Insufficient documentation

## 2011-06-04 DIAGNOSIS — M1710 Unilateral primary osteoarthritis, unspecified knee: Secondary | ICD-10-CM | POA: Insufficient documentation

## 2011-06-04 DIAGNOSIS — M25469 Effusion, unspecified knee: Secondary | ICD-10-CM | POA: Insufficient documentation

## 2011-06-04 DIAGNOSIS — M25569 Pain in unspecified knee: Secondary | ICD-10-CM | POA: Insufficient documentation

## 2011-06-04 DIAGNOSIS — Z862 Personal history of diseases of the blood and blood-forming organs and certain disorders involving the immune mechanism: Secondary | ICD-10-CM | POA: Insufficient documentation

## 2011-06-04 DIAGNOSIS — G8929 Other chronic pain: Secondary | ICD-10-CM | POA: Insufficient documentation

## 2011-06-04 DIAGNOSIS — Z8639 Personal history of other endocrine, nutritional and metabolic disease: Secondary | ICD-10-CM | POA: Insufficient documentation

## 2011-06-04 DIAGNOSIS — Z79899 Other long term (current) drug therapy: Secondary | ICD-10-CM | POA: Insufficient documentation

## 2011-06-04 DIAGNOSIS — Z8659 Personal history of other mental and behavioral disorders: Secondary | ICD-10-CM | POA: Insufficient documentation

## 2011-06-18 ENCOUNTER — Emergency Department (HOSPITAL_COMMUNITY)
Admission: EM | Admit: 2011-06-18 | Discharge: 2011-06-18 | Disposition: A | Discharge status: Home / Self Care | Payer: Medicare Other | Attending: Emergency Medicine | Admitting: Emergency Medicine

## 2011-06-18 DIAGNOSIS — M109 Gout, unspecified: Secondary | ICD-10-CM | POA: Insufficient documentation

## 2011-06-18 DIAGNOSIS — IMO0001 Reserved for inherently not codable concepts without codable children: Secondary | ICD-10-CM | POA: Insufficient documentation

## 2011-06-18 DIAGNOSIS — M2550 Pain in unspecified joint: Secondary | ICD-10-CM | POA: Insufficient documentation

## 2011-06-18 DIAGNOSIS — I10 Essential (primary) hypertension: Secondary | ICD-10-CM | POA: Insufficient documentation

## 2011-06-20 ENCOUNTER — Emergency Department (HOSPITAL_COMMUNITY)
Admission: EM | Admit: 2011-06-20 | Discharge: 2011-06-20 | Disposition: A | Discharge status: Home / Self Care | Payer: Medicare Other | Attending: Emergency Medicine | Admitting: Emergency Medicine

## 2011-06-20 DIAGNOSIS — M79609 Pain in unspecified limb: Secondary | ICD-10-CM | POA: Insufficient documentation

## 2011-06-20 DIAGNOSIS — M109 Gout, unspecified: Secondary | ICD-10-CM | POA: Insufficient documentation

## 2011-06-20 DIAGNOSIS — I10 Essential (primary) hypertension: Secondary | ICD-10-CM | POA: Insufficient documentation

## 2011-07-03 ENCOUNTER — Inpatient Hospital Stay (INDEPENDENT_AMBULATORY_CARE_PROVIDER_SITE_OTHER)
Admission: RE | Admit: 2011-07-03 | Discharge: 2011-07-03 | Disposition: A | Discharge status: Home / Self Care | Payer: Medicare Other | Source: Ambulatory Visit | Attending: Emergency Medicine | Admitting: Emergency Medicine

## 2011-07-03 DIAGNOSIS — M109 Gout, unspecified: Secondary | ICD-10-CM

## 2011-07-16 ENCOUNTER — Emergency Department (HOSPITAL_COMMUNITY)
Admission: EM | Admit: 2011-07-16 | Discharge: 2011-07-16 | Disposition: A | Discharge status: Home / Self Care | Payer: Medicare Other | Attending: Emergency Medicine | Admitting: Emergency Medicine

## 2011-07-16 DIAGNOSIS — Z8639 Personal history of other endocrine, nutritional and metabolic disease: Secondary | ICD-10-CM | POA: Insufficient documentation

## 2011-07-16 DIAGNOSIS — M79609 Pain in unspecified limb: Secondary | ICD-10-CM | POA: Insufficient documentation

## 2011-07-16 DIAGNOSIS — I10 Essential (primary) hypertension: Secondary | ICD-10-CM | POA: Insufficient documentation

## 2011-07-16 DIAGNOSIS — Z79899 Other long term (current) drug therapy: Secondary | ICD-10-CM | POA: Insufficient documentation

## 2011-07-16 DIAGNOSIS — G8929 Other chronic pain: Secondary | ICD-10-CM | POA: Insufficient documentation

## 2011-07-16 DIAGNOSIS — M25569 Pain in unspecified knee: Secondary | ICD-10-CM | POA: Insufficient documentation

## 2011-07-16 DIAGNOSIS — Z862 Personal history of diseases of the blood and blood-forming organs and certain disorders involving the immune mechanism: Secondary | ICD-10-CM | POA: Insufficient documentation

## 2011-08-12 ENCOUNTER — Emergency Department (HOSPITAL_COMMUNITY)
Admission: EM | Admit: 2011-08-12 | Discharge: 2011-08-12 | Disposition: A | Discharge status: Home / Self Care | Payer: Medicare Other | Attending: Emergency Medicine | Admitting: Emergency Medicine

## 2011-08-12 DIAGNOSIS — G8929 Other chronic pain: Secondary | ICD-10-CM | POA: Insufficient documentation

## 2011-08-12 DIAGNOSIS — L989 Disorder of the skin and subcutaneous tissue, unspecified: Secondary | ICD-10-CM | POA: Insufficient documentation

## 2011-08-12 DIAGNOSIS — M1990 Unspecified osteoarthritis, unspecified site: Secondary | ICD-10-CM | POA: Insufficient documentation

## 2011-08-12 DIAGNOSIS — I10 Essential (primary) hypertension: Secondary | ICD-10-CM | POA: Insufficient documentation

## 2011-08-12 DIAGNOSIS — M2550 Pain in unspecified joint: Secondary | ICD-10-CM | POA: Insufficient documentation

## 2011-08-29 ENCOUNTER — Emergency Department (HOSPITAL_COMMUNITY)
Admission: EM | Admit: 2011-08-29 | Discharge: 2011-08-29 | Disposition: A | Discharge status: Home / Self Care | Payer: Medicare Other | Attending: Emergency Medicine | Admitting: Emergency Medicine

## 2011-08-29 ENCOUNTER — Emergency Department (HOSPITAL_COMMUNITY): Payer: Medicare Other

## 2011-08-29 DIAGNOSIS — I10 Essential (primary) hypertension: Secondary | ICD-10-CM | POA: Insufficient documentation

## 2011-08-29 DIAGNOSIS — M79609 Pain in unspecified limb: Secondary | ICD-10-CM | POA: Insufficient documentation

## 2011-08-29 DIAGNOSIS — Y92009 Unspecified place in unspecified non-institutional (private) residence as the place of occurrence of the external cause: Secondary | ICD-10-CM | POA: Insufficient documentation

## 2011-08-29 DIAGNOSIS — W1809XA Striking against other object with subsequent fall, initial encounter: Secondary | ICD-10-CM | POA: Insufficient documentation

## 2011-08-29 DIAGNOSIS — M25539 Pain in unspecified wrist: Secondary | ICD-10-CM | POA: Insufficient documentation

## 2011-08-31 ENCOUNTER — Emergency Department (HOSPITAL_COMMUNITY)
Admission: EM | Admit: 2011-08-31 | Discharge: 2011-08-31 | Disposition: A | Discharge status: Home / Self Care | Payer: Medicare Other | Attending: Emergency Medicine | Admitting: Emergency Medicine

## 2011-08-31 DIAGNOSIS — S6390XA Sprain of unspecified part of unspecified wrist and hand, initial encounter: Secondary | ICD-10-CM | POA: Insufficient documentation

## 2011-08-31 DIAGNOSIS — I10 Essential (primary) hypertension: Secondary | ICD-10-CM | POA: Insufficient documentation

## 2011-08-31 DIAGNOSIS — M79609 Pain in unspecified limb: Secondary | ICD-10-CM | POA: Insufficient documentation

## 2011-08-31 DIAGNOSIS — W19XXXA Unspecified fall, initial encounter: Secondary | ICD-10-CM | POA: Insufficient documentation

## 2011-09-03 ENCOUNTER — Emergency Department (HOSPITAL_COMMUNITY)
Admission: EM | Admit: 2011-09-03 | Discharge: 2011-09-03 | Disposition: A | Discharge status: Home / Self Care | Payer: Medicare Other | Attending: Emergency Medicine | Admitting: Emergency Medicine

## 2011-09-03 DIAGNOSIS — I10 Essential (primary) hypertension: Secondary | ICD-10-CM | POA: Insufficient documentation

## 2011-09-03 DIAGNOSIS — M25569 Pain in unspecified knee: Secondary | ICD-10-CM | POA: Insufficient documentation

## 2011-09-03 DIAGNOSIS — G8929 Other chronic pain: Secondary | ICD-10-CM | POA: Insufficient documentation

## 2011-09-16 ENCOUNTER — Emergency Department (HOSPITAL_COMMUNITY)
Admission: EM | Admit: 2011-09-16 | Discharge: 2011-09-16 | Disposition: A | Discharge status: Home / Self Care | Payer: Medicare Other | Attending: Emergency Medicine | Admitting: Emergency Medicine

## 2011-09-16 ENCOUNTER — Emergency Department (HOSPITAL_COMMUNITY): Payer: Medicare Other

## 2011-09-16 DIAGNOSIS — M129 Arthropathy, unspecified: Secondary | ICD-10-CM | POA: Insufficient documentation

## 2011-09-16 DIAGNOSIS — Z79899 Other long term (current) drug therapy: Secondary | ICD-10-CM | POA: Insufficient documentation

## 2011-09-16 DIAGNOSIS — M25579 Pain in unspecified ankle and joints of unspecified foot: Secondary | ICD-10-CM | POA: Insufficient documentation

## 2011-10-29 ENCOUNTER — Emergency Department (HOSPITAL_COMMUNITY): Payer: Medicare Other

## 2011-10-29 ENCOUNTER — Emergency Department (HOSPITAL_COMMUNITY)
Admission: EM | Admit: 2011-10-29 | Discharge: 2011-10-29 | Disposition: A | Discharge status: Home / Self Care | Payer: Medicare Other | Attending: Emergency Medicine | Admitting: Emergency Medicine

## 2011-10-29 DIAGNOSIS — I10 Essential (primary) hypertension: Secondary | ICD-10-CM | POA: Insufficient documentation

## 2011-10-29 DIAGNOSIS — Z79899 Other long term (current) drug therapy: Secondary | ICD-10-CM | POA: Insufficient documentation

## 2011-10-29 DIAGNOSIS — M25469 Effusion, unspecified knee: Secondary | ICD-10-CM | POA: Insufficient documentation

## 2011-10-29 DIAGNOSIS — X500XXA Overexertion from strenuous movement or load, initial encounter: Secondary | ICD-10-CM | POA: Insufficient documentation

## 2011-10-29 DIAGNOSIS — M1710 Unilateral primary osteoarthritis, unspecified knee: Secondary | ICD-10-CM | POA: Insufficient documentation

## 2011-10-29 DIAGNOSIS — S8000XA Contusion of unspecified knee, initial encounter: Secondary | ICD-10-CM | POA: Insufficient documentation

## 2011-10-29 DIAGNOSIS — M1712 Unilateral primary osteoarthritis, left knee: Secondary | ICD-10-CM

## 2011-10-29 DIAGNOSIS — M25569 Pain in unspecified knee: Secondary | ICD-10-CM | POA: Insufficient documentation

## 2011-10-29 DIAGNOSIS — G8929 Other chronic pain: Secondary | ICD-10-CM | POA: Insufficient documentation

## 2011-10-29 DIAGNOSIS — M109 Gout, unspecified: Secondary | ICD-10-CM | POA: Insufficient documentation

## 2011-10-29 HISTORY — DX: Accidental discharge from unspecified firearms or gun, initial encounter: W34.00XA

## 2011-10-29 HISTORY — DX: Other chronic pain: G89.29

## 2011-10-29 HISTORY — DX: Essential (primary) hypertension: I10

## 2011-10-29 MED ORDER — OXYCODONE-ACETAMINOPHEN 5-325 MG PO TABS: 2.0000 | ORAL_TABLET | ORAL | Status: AC | PRN

## 2011-10-29 MED ORDER — OXYCODONE-ACETAMINOPHEN 5-325 MG PO TABS
2.0000 | ORAL_TABLET | Freq: Once | ORAL | Status: AC
  Administered 2011-10-29: 2 via ORAL
  Filled 2011-10-29: qty 2

## 2011-10-29 NOTE — ED Notes (Signed)
Pt able to ambulate and walk with no gait disturbance.
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
## 2011-10-29 NOTE — ED Notes (Signed)
Pt has knee immobilizer of his own from home which he is using.

## 2011-10-29 NOTE — ED Notes (Signed)
Pt also complaining of gout pain in left foot x3days.
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
## 2011-10-29 NOTE — Discharge Instructions (Signed)
Wear and Tear Disorders of the Knee (Arthritis, Osteoarthritis) Everyone will experience wear and tear injuries (arthritis, osteoarthritis) of the knee. These are the changes we all get as we age. They come from the joint stress of daily living. The amount of cartilage damage in your knee and your symptoms determine if you need surgery. Mild problems require approximately two months recovery time. More severe problems take several months to recover. With mild problems, your surgeon may find worn and rough cartilage surfaces. With severe changes, your surgeon may find cartilage that has completely worn away and exposed the bone. Loose bodies of bone and cartilage, bone spurs (excess bone growth), and injuries to the menisci (cushions between the large bones of your leg) are also common. All of these problems can cause pain. For a mild wear and tear problem, rough cartilage may simply need to be shaved and smoothed. For more severe problems with areas of exposed bone, your surgeon may use an instrument for roughing up the bone surfaces to stimulate new cartilage growth. Loose bodies are usually removed. Torn menisci may be trimmed or repaired. ABOUT THE ARTHROSCOPIC PROCEDURE Arthroscopy is a surgical technique. It allows your orthopedic surgeon to diagnose and treat your knee injury with accuracy. The surgeon looks into your knee through a small scope. The scope is like a small (pencil-sized) telescope. Arthroscopy is less invasive than open knee surgery. You can expect a more rapid recovery. After the procedure, you will be moved to a recovery area until most of the effects of the medication have worn off. Your caregiver will discuss the test results with you. RECOVERY The severity of the arthritis and the type of procedure performed will determine recovery time. Other important factors include age, physical condition, medical conditions, and the type of rehabilitation program. Strengthening your muscles after  arthroscopy helps guarantee a better recovery. Follow your caregiver's instructions. Use crutches, rest, elevate, ice, and do knee exercises as instructed. Your caregivers will help you and instruct you with exercises and other physical therapy required to regain your mobility, muscle strength, and functioning following surgery. Only take over-the-counter or prescription medicines for pain, discomfort, or fever as directed by your caregiver.  SEEK MEDICAL CARE IF:   There is increased bleeding (more than a small spot) from the wound.   You notice redness, swelling, or increasing pain in the wound.   Pus is coming from wound.   You develop an unexplained oral temperature above 102 F (38.9 C) , or as your caregiver suggests.   You notice a foul smell coming from the wound or dressing.   You have severe pain with motion of the knee.  SEEK IMMEDIATE MEDICAL CARE IF:   You develop a rash.   You have difficulty breathing.   You have any allergic problems.  MAKE SURE YOU:   Understand these instructions.   Will watch your condition.   Will get help right away if you are not doing well or get worse.  Document Released: 12/08/2000 Document Revised: 08/23/2011 Document Reviewed: 05/06/2008 [facility] Patient Information 2012 [place].

## 2011-11-12 ENCOUNTER — Emergency Department (HOSPITAL_COMMUNITY)
Admission: EM | Admit: 2011-11-12 | Discharge: 2011-11-12 | Disposition: A | Discharge status: Home / Self Care | Payer: Medicare Other | Attending: Emergency Medicine | Admitting: Emergency Medicine

## 2011-11-12 ENCOUNTER — Encounter (HOSPITAL_COMMUNITY): Payer: Self-pay | Admitting: *Deleted

## 2011-11-12 DIAGNOSIS — M129 Arthropathy, unspecified: Secondary | ICD-10-CM | POA: Insufficient documentation

## 2011-11-12 DIAGNOSIS — Z79899 Other long term (current) drug therapy: Secondary | ICD-10-CM | POA: Insufficient documentation

## 2011-11-12 DIAGNOSIS — M109 Gout, unspecified: Secondary | ICD-10-CM | POA: Insufficient documentation

## 2011-11-12 DIAGNOSIS — M7989 Other specified soft tissue disorders: Secondary | ICD-10-CM | POA: Insufficient documentation

## 2011-11-12 DIAGNOSIS — G8929 Other chronic pain: Secondary | ICD-10-CM | POA: Insufficient documentation

## 2011-11-12 DIAGNOSIS — I10 Essential (primary) hypertension: Secondary | ICD-10-CM | POA: Insufficient documentation

## 2011-11-12 DIAGNOSIS — M79609 Pain in unspecified limb: Secondary | ICD-10-CM | POA: Insufficient documentation

## 2011-11-12 HISTORY — DX: Unspecified osteoarthritis, unspecified site: M19.90

## 2011-11-12 MED ORDER — OXYCODONE-ACETAMINOPHEN 5-325 MG PO TABS
1.0000 | ORAL_TABLET | Freq: Once | ORAL | Status: AC
  Administered 2011-11-12: 1 via ORAL
  Filled 2011-11-12: qty 1

## 2011-11-12 NOTE — ED Notes (Signed)
Pt has pain in both knees and right great toe.  Pain in knee is from arthritis and pain in right great toe is from gout flare up.  Pt states that he has generalized "chronic pain all over my whole body".   Pt went to the pain clinic in the past.
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
## 2011-11-19 ENCOUNTER — Emergency Department (HOSPITAL_COMMUNITY)
Admission: EM | Admit: 2011-11-19 | Discharge: 2011-11-19 | Disposition: A | Discharge status: Home / Self Care | Payer: Medicare Other | Attending: Emergency Medicine | Admitting: Emergency Medicine

## 2011-11-19 ENCOUNTER — Encounter (HOSPITAL_COMMUNITY): Payer: Self-pay | Admitting: Emergency Medicine

## 2011-11-19 DIAGNOSIS — M79609 Pain in unspecified limb: Secondary | ICD-10-CM | POA: Insufficient documentation

## 2011-11-19 DIAGNOSIS — G8929 Other chronic pain: Secondary | ICD-10-CM

## 2011-11-19 DIAGNOSIS — I10 Essential (primary) hypertension: Secondary | ICD-10-CM | POA: Insufficient documentation

## 2011-11-19 DIAGNOSIS — M25569 Pain in unspecified knee: Secondary | ICD-10-CM | POA: Insufficient documentation

## 2011-11-19 MED ORDER — OXYCODONE-ACETAMINOPHEN 5-325 MG PO TABS
ORAL_TABLET | ORAL | Status: AC
  Filled 2011-11-19: qty 1

## 2011-11-19 MED ORDER — OXYCODONE-ACETAMINOPHEN 5-325 MG PO TABS
1.0000 | ORAL_TABLET | Freq: Once | ORAL | Status: AC
  Administered 2011-11-19: 1 via ORAL
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
## 2011-11-19 NOTE — ED Provider Notes (Signed)
Evaluation and management procedures were performed by the PA/NP under my supervision/collaboration.   Charlena Cross, MD 11/19/11 [phone]

## 2011-11-19 NOTE — ED Notes (Signed)
Pt. Given [employer].

## 2011-11-19 NOTE — Discharge Instructions (Signed)
Please take ibuprofen and Tylenol as tolerated for additional pain relief until appointment. Chronic Pain Chronic pain can be defined as pain that is lasting, off and on, and lasts for 3 to 6 months or longer. Many things cause chronic pain, which can make it difficult to make a discrete diagnosis. There are many treatment options available for chronic pain. However, finding a treatment that works well for you may require trying various approaches until a suitable one is found. CAUSES  In some types of chronic medical conditions, the pain is caused by a normal pain response within the body. A normal pain response helps the body identify illness or injury and prevent further damage from being done. In these cases, the cause of the pain may be identified and treated, even if it may not be cured completely. Examples of chronic conditions which can cause chronic pain include:  Inflammation of the joints (arthritis).   Back pain or neck pain (including bulging or herniated disks).   Migraine headaches.   Cancer.  In some other types of chronic pain syndromes, the pain is caused by an abnormal pain response within the body. An abnormal pain response is present when there is no ongoing cause (or stimulus) for the pain, or when the cause of the pain is arising from the nerves or nervous system itself. Examples of conditions which can cause chronic pain due to an abnormal pain response include:  Fibromyalgia.   Reflex sympathetic dystrophy (RSD).   Neuropathy (when the nerves themselves are damaged, and may cause pain).  DIAGNOSIS  Your caregiver will help diagnose your condition over time. In many cases, the initial focus will be on excluding conditions that could be causing the pain. Depending on your symptoms, your caregiver may order some tests to diagnose your condition. Some of these tests include:  Blood tests.   Computerized X-ray scans (CT scan).   Computerized magnetic scans (MRI).    X-rays.   Ultrasounds.   Nerve conduction studies.   Consultation with other physicians or specialists.  TREATMENT  There are many treatment options for people suffering from chronic pain. Finding a treatment that works well may take time.   You may be referred to a pain management specialist.   You may be put on medication to help with the pain. Unfortunately, some medications (such as opiate medications) may not be very effective in cases where chronic pain is due to abnormal pain responses. Finding the right medications can take some time.   Adjunctive therapies may be used to provide additional relief and improve a patient's quality of life. These therapies include:   Mindfulness meditation.   Acupuncture.   Biofeedback.   Cognitive-behavioral therapy.   In certain cases, surgical interventions may be attempted.  HOME CARE INSTRUCTIONS   Make sure you understand these instructions prior to discharge.   Ask any questions and share any further concerns you have with your caregiver prior to discharge.   Take all medications as directed by your caregiver.   Keep all follow-up appointments.  SEEK MEDICAL CARE IF:   Your pain gets worse.   You develop a new pain that was not present before.   You cannot tolerate any medications prescribed by your caregiver.   You develop new symptoms since your last visit with your caregiver.  SEEK IMMEDIATE MEDICAL CARE IF:   You develop muscular weakness.   You have decreased sensation or numbness.   You lose control of bowel or bladder function.  Your pain suddenly gets much worse.   You have an oral temperature above 102 F (38.9 C), not controlled by medication.   You develop shaking chills, confusion, chest pain, or shortness of breath.  Document Released: 09/02/2002 Document Revised: 08/23/2011 Document Reviewed: 12/09/2008 [facility] Patient Information 2012 [place].

## 2011-11-19 NOTE — ED Notes (Signed)
Pt reports onset Friday with pain to right toe and left knee. Pt reports right toe and left knee red and swollen.

## 2011-12-20 ENCOUNTER — Encounter (HOSPITAL_COMMUNITY): Payer: Self-pay | Admitting: *Deleted

## 2011-12-20 ENCOUNTER — Emergency Department (HOSPITAL_COMMUNITY): Payer: Medicare Other

## 2011-12-20 ENCOUNTER — Emergency Department (HOSPITAL_COMMUNITY)
Admission: EM | Admit: 2011-12-20 | Discharge: 2011-12-20 | Disposition: A | Discharge status: Home / Self Care | Payer: Medicare Other | Attending: Emergency Medicine | Admitting: Emergency Medicine

## 2011-12-20 DIAGNOSIS — M25569 Pain in unspecified knee: Secondary | ICD-10-CM | POA: Insufficient documentation

## 2011-12-20 DIAGNOSIS — M79642 Pain in left hand: Secondary | ICD-10-CM

## 2011-12-20 DIAGNOSIS — I10 Essential (primary) hypertension: Secondary | ICD-10-CM | POA: Insufficient documentation

## 2011-12-20 DIAGNOSIS — F17200 Nicotine dependence, unspecified, uncomplicated: Secondary | ICD-10-CM | POA: Insufficient documentation

## 2011-12-20 DIAGNOSIS — M25562 Pain in left knee: Secondary | ICD-10-CM

## 2011-12-20 DIAGNOSIS — W19XXXA Unspecified fall, initial encounter: Secondary | ICD-10-CM | POA: Insufficient documentation

## 2011-12-20 DIAGNOSIS — M79609 Pain in unspecified limb: Secondary | ICD-10-CM | POA: Insufficient documentation

## 2011-12-20 DIAGNOSIS — M1990 Unspecified osteoarthritis, unspecified site: Secondary | ICD-10-CM

## 2011-12-20 HISTORY — DX: Unspecified injury of head, initial encounter: S09.90XA

## 2011-12-20 MED ORDER — MORPHINE SULFATE 4 MG/ML IJ SOLN
4.0000 mg | Freq: Once | INTRAMUSCULAR | Status: AC
  Administered 2011-12-20: 4 mg via INTRAMUSCULAR
  Filled 2011-12-20: qty 1

## 2011-12-20 MED ORDER — OXYCODONE-ACETAMINOPHEN 5-325 MG PO TABS: 2.0000 | ORAL_TABLET | ORAL | Status: AC | PRN

## 2011-12-20 NOTE — ED Notes (Signed)
Pt states "was given 40 of ultram last Monday & to take 1 every 12 hours"
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
## 2011-12-20 NOTE — Discharge Instructions (Signed)
Your x-rays do not show any broken bones or dislocations.  Use ice and ibuprofen 600 mg every 6 hours to reduce pain and swelling.  Use Percocet for more severe pain.  Followup with your Dr. for reevaluation.  If your symptoms.  Last more than 3-4 days.

## 2012-02-11 ENCOUNTER — Emergency Department (HOSPITAL_COMMUNITY): Payer: Medicare Other

## 2012-02-11 ENCOUNTER — Encounter (HOSPITAL_COMMUNITY): Payer: Self-pay | Admitting: *Deleted

## 2012-02-11 ENCOUNTER — Emergency Department (HOSPITAL_COMMUNITY)
Admission: EM | Admit: 2012-02-11 | Discharge: 2012-02-11 | Disposition: A | Discharge status: Home / Self Care | Payer: Medicare Other | Attending: Emergency Medicine | Admitting: Emergency Medicine

## 2012-02-11 DIAGNOSIS — Z79899 Other long term (current) drug therapy: Secondary | ICD-10-CM | POA: Insufficient documentation

## 2012-02-11 DIAGNOSIS — W010XXA Fall on same level from slipping, tripping and stumbling without subsequent striking against object, initial encounter: Secondary | ICD-10-CM | POA: Insufficient documentation

## 2012-02-11 DIAGNOSIS — Y9329 Activity, other involving ice and snow: Secondary | ICD-10-CM | POA: Insufficient documentation

## 2012-02-11 DIAGNOSIS — F17200 Nicotine dependence, unspecified, uncomplicated: Secondary | ICD-10-CM | POA: Insufficient documentation

## 2012-02-11 DIAGNOSIS — M129 Arthropathy, unspecified: Secondary | ICD-10-CM | POA: Insufficient documentation

## 2012-02-11 DIAGNOSIS — IMO0001 Reserved for inherently not codable concepts without codable children: Secondary | ICD-10-CM | POA: Insufficient documentation

## 2012-02-11 DIAGNOSIS — Z862 Personal history of diseases of the blood and blood-forming organs and certain disorders involving the immune mechanism: Secondary | ICD-10-CM | POA: Insufficient documentation

## 2012-02-11 DIAGNOSIS — M79609 Pain in unspecified limb: Secondary | ICD-10-CM | POA: Insufficient documentation

## 2012-02-11 DIAGNOSIS — I10 Essential (primary) hypertension: Secondary | ICD-10-CM | POA: Insufficient documentation

## 2012-02-11 DIAGNOSIS — M79645 Pain in left finger(s): Secondary | ICD-10-CM

## 2012-02-11 DIAGNOSIS — Z8639 Personal history of other endocrine, nutritional and metabolic disease: Secondary | ICD-10-CM | POA: Insufficient documentation

## 2012-02-11 DIAGNOSIS — Z9889 Other specified postprocedural states: Secondary | ICD-10-CM | POA: Insufficient documentation

## 2012-02-11 MED ORDER — OXYCODONE-ACETAMINOPHEN 5-325 MG PO TABS
1.0000 | ORAL_TABLET | Freq: Once | ORAL | Status: AC
  Administered 2012-02-11: 1 via ORAL
  Filled 2012-02-11: qty 1

## 2012-02-11 MED ORDER — KETOROLAC TROMETHAMINE 60 MG/2ML IM SOLN
60.0000 mg | Freq: Once | INTRAMUSCULAR | Status: AC
  Administered 2012-02-11: 60 mg via INTRAMUSCULAR
  Filled 2012-02-11: qty 2

## 2012-02-11 NOTE — ED Notes (Signed)
Reports chronic arthritis pain, had a fall yesterday and now severe pain to left thumb. No distress noted at triage.

## 2012-02-11 NOTE — ED Provider Notes (Signed)
Medical screening examination/treatment/procedure(s) were conducted as a shared visit with non-physician practitioner(s) and myself.  I personally evaluated the patient during the encounter  Patient with chronic pain in his right thumb.  He has a Copy.  I recommended that he followup with his primary care doctor and use NSAIDs in the meantime.  1. Pain of left thumb    Dg Finger Thumb Left  02/11/2012  *RADIOLOGY REPORT*  Clinical Data: Pain post fall.  LEFT THUMB 2+V  Comparison: 12/20/2011  Findings: Stable orthopedic screws at the base of the first metacarpal.  Negative for fracture, dislocation, or other acute bony abnormality.  IMPRESSION:  1.  Negative for fracture or other acute abnormality.  Original Report Authenticated By: Trecia Rogers, M.D.   I personally reviewed the x-ray   Hoy Morn, MD 02/11/12 1246
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
## 2012-02-11 NOTE — Discharge Instructions (Signed)
Mr. Borghese we gave you a shot of Toradol in the ER today for pain. Followup with Dr. Jimmye Norman as needed. Take ibuprofen 800 for pain starting tomorrow if needed. Continue to wear your risks splint as needed for comfort.

## 2012-06-22 ENCOUNTER — Encounter (HOSPITAL_COMMUNITY): Payer: Self-pay | Admitting: Emergency Medicine

## 2012-06-22 ENCOUNTER — Emergency Department (HOSPITAL_COMMUNITY)
Admission: EM | Admit: 2012-06-22 | Discharge: 2012-06-22 | Disposition: A | Discharge status: Home / Self Care | Payer: Medicare Other | Attending: Emergency Medicine | Admitting: Emergency Medicine

## 2012-06-22 DIAGNOSIS — Z8614 Personal history of Methicillin resistant Staphylococcus aureus infection: Secondary | ICD-10-CM | POA: Insufficient documentation

## 2012-06-22 DIAGNOSIS — I10 Essential (primary) hypertension: Secondary | ICD-10-CM | POA: Insufficient documentation

## 2012-06-22 DIAGNOSIS — F17200 Nicotine dependence, unspecified, uncomplicated: Secondary | ICD-10-CM | POA: Insufficient documentation

## 2012-06-22 DIAGNOSIS — M109 Gout, unspecified: Secondary | ICD-10-CM | POA: Insufficient documentation

## 2012-06-22 DIAGNOSIS — G8929 Other chronic pain: Secondary | ICD-10-CM | POA: Insufficient documentation

## 2012-06-22 DIAGNOSIS — W010XXA Fall on same level from slipping, tripping and stumbling without subsequent striking against object, initial encounter: Secondary | ICD-10-CM | POA: Insufficient documentation

## 2012-06-22 DIAGNOSIS — M549 Dorsalgia, unspecified: Secondary | ICD-10-CM | POA: Insufficient documentation

## 2012-06-22 DIAGNOSIS — Y92009 Unspecified place in unspecified non-institutional (private) residence as the place of occurrence of the external cause: Secondary | ICD-10-CM | POA: Insufficient documentation

## 2012-06-22 DIAGNOSIS — Z79899 Other long term (current) drug therapy: Secondary | ICD-10-CM | POA: Insufficient documentation

## 2012-06-22 DIAGNOSIS — L089 Local infection of the skin and subcutaneous tissue, unspecified: Secondary | ICD-10-CM

## 2012-06-22 MED ORDER — MUPIROCIN 2 % EX OINT: TOPICAL_OINTMENT | Freq: Three times a day (TID) | CUTANEOUS | Status: AC

## 2012-06-22 MED ORDER — HYDROMORPHONE HCL PF 1 MG/ML IJ SOLN
0.5000 mg | Freq: Once | INTRAMUSCULAR | Status: AC
  Administered 2012-06-22: 0.5 mg via INTRAMUSCULAR
  Filled 2012-06-22: qty 1
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
## 2012-06-22 NOTE — Discharge Instructions (Signed)
Use bactroban ointment to area of skin infection and follow up closely with your primary care provider next week for recheck. It is VERY important to follow up with your physician who manages your chronic pain for ongoing pain management. Return to the ER at any time for emergent changing or worsening of symptoms.   Chronic Back Pain When back pain lasts longer than 3 months, it is called chronic back pain.This pain can be frustrating, but the cause of the pain is rarely dangerous.People with chronic back pain often go through certain periods that are more intense (flare-ups). CAUSES Chronic back pain can be caused by wear and tear (degeneration) on different structures in your back. These structures may include bones, ligaments, or discs. This degeneration may result in more pressure being placed on the nerves that travel to your legs and feet. This can lead to pain traveling from the low back down the back of the legs. When pain lasts longer than 3 months, it is not unusual for people to experience anxiety or depression. Anxiety and depression can also contribute to low back pain. TREATMENT  Establish a regular exercise plan. This is critical to improving your functional level.   Have a self-management plan for when you flare-up. Flare-ups rarely require a medical visit. Regular exercise will help reduce the intensity and frequency of your flare-ups.   Manage how you feel about your back pain and the rest of your life. Anxiety, depression, and feeling that you cannot alter your back pain have been shown to make back pain more intense and debilitating.   Medicines should never be your only treatment. They should be used along with other treatments to help you return to a more active lifestyle.   Procedures such as injections or surgery may be helpful but are rarely necessary. You may be able to get the same results with physical therapy or chiropractic care.  HOME CARE INSTRUCTIONS  Avoid  bending, heavy lifting, prolonged sitting, and activities which make the problem worse.   Continue normal activity as much as possible.   Take brief periods of rest throughout the day to reduce your pain during flare-ups.   Follow your back exercise rehabilitation program. This can help reduce symptoms and prevent more pain.   Only take over-the-counter or prescription medicines as directed by your caregiver. Muscle relaxants are sometimes prescribed. Narcotic pain medicine is discouraged for long-term pain, since addiction is a possible outcome.   If you smoke, quit.   Eat healthy foods and maintain a recommended body weight.  SEEK IMMEDIATE MEDICAL CARE IF:   You have weakness or numbness in one of your legs or feet.   You have trouble controlling your bladder or bowels.   You develop nausea, vomiting, abdominal pain, shortness of breath, or fainting.  Document Released: 01/18/2005 Document Revised: 11/30/2011 Document Reviewed: 11/25/2011 [facility] Patient Information 2012 [place].  Chronic Pain Management Managing chronic pain is not easy. The goal is to provide as much pain relief as possible. There are emotional as well as physical problems. Chronic pain may lead to symptoms of depression which magnify those of the pain. Problems may include:  Anxiety.   Sleep disturbances.   Confused thinking.   Feeling cranky.   Fatigue.   Weight gain or loss.  Identify the source of the pain first, if possible. The pain may be masking another problem. Try to find a pain management specialist or clinic. Work with a team to create a treatment plan for  you. MEDICATIONS  May include narcotics or opioids. Larger than normal doses may be needed to control your pain.   Drugs for depression may help.   Over-the-counter medicines may help for some conditions. These drugs may be used along with others for better pain relief.   May be injected into sites such as the spine and  joints. Injections may have to be repeated if they wear off.  THERAPY MAY INCLUDE:  Working with a physical therapist to keep from getting stiff.   Regular, gentle exercise.   Cognitive or behavioral therapy.   Using complementary or integrative medicine such as:   Acupuncture.   Massage, Reiki, or Rolfing.   Aroma, color, light, or sound therapy.   Group support.  FOR MORE INFORMATION [email or web address].com/. American Chronic Pain Association [email or web address].[email or web address]/. Document Released: 01/18/2005 Document Revised: 11/30/2011 Document Reviewed: 02/27/2008 [facility] Patient Information 2012 [place], [place].
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
## 2012-06-22 NOTE — ED Provider Notes (Signed)
Medical screening examination/treatment/procedure(s) were performed by non-physician practitioner and as supervising physician I was immediately available for consultation/collaboration.   Julianne Rice, MD 06/22/12 [phone]

## 2012-06-22 NOTE — ED Notes (Signed)
MD at bedside. 

## 2012-09-19 ENCOUNTER — Encounter (HOSPITAL_COMMUNITY): Payer: Self-pay | Admitting: Emergency Medicine

## 2012-09-19 ENCOUNTER — Emergency Department (HOSPITAL_COMMUNITY)
Admission: EM | Admit: 2012-09-19 | Discharge: 2012-09-19 | Disposition: A | Discharge status: Home / Self Care | Payer: Medicare Other | Attending: Emergency Medicine | Admitting: Emergency Medicine

## 2012-09-19 DIAGNOSIS — S99929A Unspecified injury of unspecified foot, initial encounter: Secondary | ICD-10-CM

## 2012-09-19 DIAGNOSIS — M109 Gout, unspecified: Secondary | ICD-10-CM | POA: Insufficient documentation

## 2012-09-19 DIAGNOSIS — M79609 Pain in unspecified limb: Secondary | ICD-10-CM | POA: Insufficient documentation

## 2012-09-19 DIAGNOSIS — F17200 Nicotine dependence, unspecified, uncomplicated: Secondary | ICD-10-CM | POA: Insufficient documentation

## 2012-09-19 DIAGNOSIS — M129 Arthropathy, unspecified: Secondary | ICD-10-CM | POA: Insufficient documentation

## 2012-09-19 DIAGNOSIS — G8929 Other chronic pain: Secondary | ICD-10-CM | POA: Insufficient documentation

## 2012-09-19 DIAGNOSIS — I10 Essential (primary) hypertension: Secondary | ICD-10-CM | POA: Insufficient documentation

## 2012-09-19 NOTE — ED Notes (Addendum)
Per EMS, pt "stubbed" his toe opening his front door. Pt reported there was a lot of bleeding, but by the time EMS arrived, it had stopped.  Pt has a hx of gout and said pain has increased.  Pt sated he was worried because "I am on blood thinners."

## 2012-09-19 NOTE — ED Notes (Signed)
[id number] Expected date:<BR> Expected time:<BR> Means of arrival:<BR> Comments:<BR> EMS- gout pain
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
## 2012-09-20 NOTE — ED Provider Notes (Signed)
Medical screening examination/treatment/procedure(s) were performed by non-physician practitioner and as supervising physician I was immediately available for consultation/collaboration.   Saddie Benders. Dorna Mai, MD 09/20/12 1946

## 2013-01-19 ENCOUNTER — Encounter (HOSPITAL_COMMUNITY): Payer: Self-pay | Admitting: *Deleted

## 2013-01-19 ENCOUNTER — Emergency Department (HOSPITAL_COMMUNITY)
Admission: EM | Admit: 2013-01-19 | Discharge: 2013-01-19 | Disposition: A | Discharge status: Home / Self Care | Payer: Medicare Other | Attending: Emergency Medicine | Admitting: Emergency Medicine

## 2013-01-19 DIAGNOSIS — F17200 Nicotine dependence, unspecified, uncomplicated: Secondary | ICD-10-CM | POA: Insufficient documentation

## 2013-01-19 DIAGNOSIS — I10 Essential (primary) hypertension: Secondary | ICD-10-CM | POA: Insufficient documentation

## 2013-01-19 DIAGNOSIS — B353 Tinea pedis: Secondary | ICD-10-CM

## 2013-01-19 DIAGNOSIS — G8929 Other chronic pain: Secondary | ICD-10-CM | POA: Insufficient documentation

## 2013-01-19 DIAGNOSIS — M109 Gout, unspecified: Secondary | ICD-10-CM | POA: Insufficient documentation

## 2013-01-19 DIAGNOSIS — Z8739 Personal history of other diseases of the musculoskeletal system and connective tissue: Secondary | ICD-10-CM | POA: Insufficient documentation

## 2013-01-19 DIAGNOSIS — Z79899 Other long term (current) drug therapy: Secondary | ICD-10-CM | POA: Insufficient documentation

## 2013-01-19 DIAGNOSIS — Z87828 Personal history of other (healed) physical injury and trauma: Secondary | ICD-10-CM | POA: Insufficient documentation

## 2013-01-19 DIAGNOSIS — B351 Tinea unguium: Secondary | ICD-10-CM | POA: Insufficient documentation

## 2013-01-19 MED ORDER — ACETAMINOPHEN 325 MG PO TABS
650.0000 mg | ORAL_TABLET | Freq: Once | ORAL | Status: AC
  Administered 2013-01-19: 650 mg via ORAL
  Filled 2013-01-19: qty 2

## 2013-01-19 MED ORDER — CLOTRIMAZOLE-BETAMETHASONE 1-0.05 % EX CREA: TOPICAL_CREAM | Freq: Two times a day (BID) | CUTANEOUS | Status: DC
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
## 2013-01-19 NOTE — Discharge Instructions (Signed)
Apply cream to the sole of your feet twice daily and keep it on.  Do it for the next month.  Keep feet dry as often as possible.  Follow up with your doctor for further care  Athlete's Foot Tinea Pedis, more commonly know as athlete's foot, is a contagious fungal skin infection of the feet. Athlete's foot usually affects the skin between the fourth and fifth toes. It is called athlete's foot because it is a common occurrence in athletes. SYMPTOMS   Scales on the feet, most commonly between the toes, that appear moist, soft, gray-white, or red.  Dead skin between the toes.  Itching in the inflamed areas.  Damp, musty foot odor.  Sometimes, small blisters on the feet, caused by a hypersensitivity to the fungus. CAUSES  Infection is caused by contact with a fungus or yeast. The fungus or yeast typically reside in moist socks and shoes, because the fungus thrives in dark, moist environments. RISK INCREASES WITH:  Walking barefoot public places such as bathrooms or showers  Poor hygiene of the feet: infrequent washing of the feet and/or infrequent changing of shoes or socks.  Hot, humid weather.  Forgetting to dry the spaces between your toes. PREVENTION  Follow good locker room hygiene.  Use your own towels.  Wear shoes or sandals.  Wash with warm or hot water and soap.  Wash your feet daily. Dry thoroughly, especially between the toes. Dust with talcum powder or antifungal powder.  Allow feet to dry out by occasionally walking barefoot.  Change socks daily.  Wear socks made of cotton, wool, or other natural absorbent fibers. Avoid socks made from synthetic fibers. PROGNOSIS  With appropriate treatment athlete's foot typically may resolve within 3 weeks, although, recurrence is common.  RELATED COMPLICATIONS   Chronic infection or recurrence, especially if not appropriately or completely treated.  Bacterial infection on top of the fungal infection in the affected area  (bacterial superinfection or secondary bacterial infection).  Rarely, an allergic autoimmune response to the infection on the hands and face. TREATMENT Initially, keep the affected area dry and cool. Remove the scaly and dead material if it is present. Change socks daily. Walking barefoot or wearing sandals whenever possible helps dry the affected area. Use antifungal powders, creams, or ointments after each bath. If the infection does not respond to topical treatment, contact your caregiver and he or she may prescribe oral antifungal medication. MEDICATION   Non-prescription antifungal creams, ointments, or powders can be used on your feet or toes or in shoes.  Anti-itch medications may be prescribed as necessary by your caregiver. Use only as directed and only as much as you need.  Oral antifungal medications may be prescribed by your caregiver. Take the entire course of medication as prescribed. SEEK MEDICAL CARE IF:   Symptoms get worse or do not improve in 2 weeks despite treatment.  New, unexplained symptoms develop (drugs used in treatment may produce side effects). Document Released: 12/11/2005 Document Revised: 03/04/2012 Document Reviewed: 03/25/2009 [facility] Patient Information 2013 [place].

## 2013-01-19 NOTE — ED Notes (Signed)
Patient with c/o laceration to his foot.  Per [facility], patient has really dry feet that are starting to crack and hurts to walk.  Patient is walking w/o problems

## 2013-01-21 NOTE — ED Provider Notes (Signed)
Medical screening examination/treatment/procedure(s) were performed by non-physician practitioner and as supervising physician I was immediately available for consultation/collaboration.   Blanchie Dessert, MD 01/21/13 [phone]

## 2013-04-07 ENCOUNTER — Encounter (HOSPITAL_COMMUNITY): Payer: Self-pay | Admitting: Emergency Medicine

## 2013-04-07 ENCOUNTER — Emergency Department (HOSPITAL_COMMUNITY)
Admission: EM | Admit: 2013-04-07 | Discharge: 2013-04-07 | Disposition: A | Discharge status: Home / Self Care | Payer: Medicare Other | Attending: Emergency Medicine | Admitting: Emergency Medicine

## 2013-04-07 ENCOUNTER — Emergency Department (HOSPITAL_COMMUNITY): Payer: Medicare Other

## 2013-04-07 DIAGNOSIS — Z87828 Personal history of other (healed) physical injury and trauma: Secondary | ICD-10-CM | POA: Insufficient documentation

## 2013-04-07 DIAGNOSIS — IMO0002 Reserved for concepts with insufficient information to code with codable children: Secondary | ICD-10-CM | POA: Insufficient documentation

## 2013-04-07 DIAGNOSIS — F209 Schizophrenia, unspecified: Secondary | ICD-10-CM | POA: Insufficient documentation

## 2013-04-07 DIAGNOSIS — Z23 Encounter for immunization: Secondary | ICD-10-CM | POA: Insufficient documentation

## 2013-04-07 DIAGNOSIS — I10 Essential (primary) hypertension: Secondary | ICD-10-CM | POA: Insufficient documentation

## 2013-04-07 DIAGNOSIS — T148XXA Other injury of unspecified body region, initial encounter: Secondary | ICD-10-CM | POA: Insufficient documentation

## 2013-04-07 DIAGNOSIS — F411 Generalized anxiety disorder: Secondary | ICD-10-CM | POA: Insufficient documentation

## 2013-04-07 DIAGNOSIS — H6690 Otitis media, unspecified, unspecified ear: Secondary | ICD-10-CM | POA: Insufficient documentation

## 2013-04-07 DIAGNOSIS — T07XXXA Unspecified multiple injuries, initial encounter: Secondary | ICD-10-CM | POA: Insufficient documentation

## 2013-04-07 DIAGNOSIS — Z79899 Other long term (current) drug therapy: Secondary | ICD-10-CM | POA: Insufficient documentation

## 2013-04-07 DIAGNOSIS — F319 Bipolar disorder, unspecified: Secondary | ICD-10-CM | POA: Insufficient documentation

## 2013-04-07 DIAGNOSIS — M109 Gout, unspecified: Secondary | ICD-10-CM | POA: Insufficient documentation

## 2013-04-07 DIAGNOSIS — Z9889 Other specified postprocedural states: Secondary | ICD-10-CM | POA: Insufficient documentation

## 2013-04-07 DIAGNOSIS — S0990XA Unspecified injury of head, initial encounter: Secondary | ICD-10-CM | POA: Insufficient documentation

## 2013-04-07 DIAGNOSIS — F17200 Nicotine dependence, unspecified, uncomplicated: Secondary | ICD-10-CM | POA: Insufficient documentation

## 2013-04-07 HISTORY — DX: Schizophrenia, unspecified: F20.9

## 2013-04-07 HISTORY — DX: Anxiety disorder, unspecified: F41.9

## 2013-04-07 HISTORY — DX: Depression, unspecified: F32.A

## 2013-04-07 HISTORY — DX: Major depressive disorder, single episode, unspecified: F32.9

## 2013-04-07 HISTORY — DX: Bipolar disorder, unspecified: F31.9

## 2013-04-07 MED ORDER — DEXAMETHASONE SODIUM PHOSPHATE 10 MG/ML IJ SOLN: 10.0000 mg | Freq: Once | INTRAMUSCULAR | Status: DC

## 2013-04-07 MED ORDER — IBUPROFEN 200 MG PO TABS
400.0000 mg | ORAL_TABLET | Freq: Once | ORAL | Status: AC
  Administered 2013-04-07: 400 mg via ORAL
  Filled 2013-04-07: qty 2

## 2013-04-07 MED ORDER — KETOROLAC TROMETHAMINE 30 MG/ML IJ SOLN
30.0000 mg | Freq: Once | INTRAMUSCULAR | Status: AC
  Administered 2013-04-07: 30 mg via INTRAMUSCULAR
  Filled 2013-04-07: qty 1

## 2013-04-07 MED ORDER — TETANUS-DIPHTH-ACELL PERTUSSIS 5-2.5-18.5 LF-MCG/0.5 IM SUSP
0.5000 mL | Freq: Once | INTRAMUSCULAR | Status: AC
  Administered 2013-04-07: 0.5 mL via INTRAMUSCULAR
  Filled 2013-04-07: qty 0.5

## 2013-04-07 MED ORDER — DEXAMETHASONE SODIUM PHOSPHATE 10 MG/ML IJ SOLN
10.0000 mg | Freq: Once | INTRAMUSCULAR | Status: AC
  Administered 2013-04-07: 10 mg via INTRAMUSCULAR
  Filled 2013-04-07: qty 1
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
## 2013-04-09 ENCOUNTER — Encounter (HOSPITAL_COMMUNITY): Payer: Self-pay | Admitting: *Deleted

## 2013-04-09 ENCOUNTER — Emergency Department (HOSPITAL_COMMUNITY)
Admission: EM | Admit: 2013-04-09 | Discharge: 2013-04-09 | Disposition: A | Discharge status: Home / Self Care | Payer: Medicare Other | Attending: Emergency Medicine | Admitting: Emergency Medicine

## 2013-04-09 DIAGNOSIS — Z8739 Personal history of other diseases of the musculoskeletal system and connective tissue: Secondary | ICD-10-CM | POA: Insufficient documentation

## 2013-04-09 DIAGNOSIS — H1130 Conjunctival hemorrhage, unspecified eye: Secondary | ICD-10-CM | POA: Insufficient documentation

## 2013-04-09 DIAGNOSIS — Z87828 Personal history of other (healed) physical injury and trauma: Secondary | ICD-10-CM | POA: Insufficient documentation

## 2013-04-09 DIAGNOSIS — H1131 Conjunctival hemorrhage, right eye: Secondary | ICD-10-CM

## 2013-04-09 DIAGNOSIS — Z862 Personal history of diseases of the blood and blood-forming organs and certain disorders involving the immune mechanism: Secondary | ICD-10-CM | POA: Insufficient documentation

## 2013-04-09 DIAGNOSIS — Z8639 Personal history of other endocrine, nutritional and metabolic disease: Secondary | ICD-10-CM | POA: Insufficient documentation

## 2013-04-09 DIAGNOSIS — S8010XA Contusion of unspecified lower leg, initial encounter: Secondary | ICD-10-CM | POA: Insufficient documentation

## 2013-04-09 DIAGNOSIS — F411 Generalized anxiety disorder: Secondary | ICD-10-CM | POA: Insufficient documentation

## 2013-04-09 DIAGNOSIS — F17200 Nicotine dependence, unspecified, uncomplicated: Secondary | ICD-10-CM | POA: Insufficient documentation

## 2013-04-09 DIAGNOSIS — Z79899 Other long term (current) drug therapy: Secondary | ICD-10-CM | POA: Insufficient documentation

## 2013-04-09 DIAGNOSIS — F209 Schizophrenia, unspecified: Secondary | ICD-10-CM | POA: Insufficient documentation

## 2013-04-09 DIAGNOSIS — IMO0002 Reserved for concepts with insufficient information to code with codable children: Secondary | ICD-10-CM | POA: Insufficient documentation

## 2013-04-09 DIAGNOSIS — I10 Essential (primary) hypertension: Secondary | ICD-10-CM | POA: Insufficient documentation

## 2013-04-09 DIAGNOSIS — S40029A Contusion of unspecified upper arm, initial encounter: Secondary | ICD-10-CM | POA: Insufficient documentation

## 2013-04-09 DIAGNOSIS — F319 Bipolar disorder, unspecified: Secondary | ICD-10-CM | POA: Insufficient documentation

## 2013-04-09 MED ORDER — KETOROLAC TROMETHAMINE 60 MG/2ML IM SOLN
60.0000 mg | Freq: Once | INTRAMUSCULAR | Status: AC
  Administered 2013-04-09: 60 mg via INTRAMUSCULAR
  Filled 2013-04-09: qty 2
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
## 2013-04-09 NOTE — Discharge Instructions (Signed)
Your eye redness is called subconjunctival hemorrhage. It should improve on its own with time. If any problems, please follow up with your primary care doctor.    Subconjunctival Hemorrhage A subconjunctival hemorrhage is a bright red patch covering a portion of the white of the eye. The white part of the eye is called the sclera, and it is covered by a thin membrane called the conjunctiva. This membrane is clear, except for tiny blood vessels that you can see with the naked eye. When your eye is irritated or inflamed and becomes red, it is because the vessels in the conjunctiva are swollen. Sometimes, a blood vessel in the conjunctiva can break and bleed. When this occurs, the blood builds up between the conjunctiva and the sclera, and spreads out to create a red area. The red spot may be very small at first. It may then spread to cover a larger part of the surface of the eye, or even all of the visible white part of the eye. In almost all cases, the blood will go away and the eye will become white again. Before completely dissolving, however, the red area may spread. It may also become brownish-yellow in color, before going away. If a lot of blood collects under the conjunctiva, it may look like a bulge on the surface of the eye. This looks scary, but it will also eventually flatten out and go away. Subconjunctival hemorrhages do not cause pain, but if swollen, may cause a feeling of irritation. There is no effect on vision.  CAUSES   The most common cause is mild trauma (rubbing the eye, irritation).  Subconjunctival hemorrhages can happen because of coughing or straining (lifting heavy objects), vomiting, or sneezing.  In some cases, your doctor may want to check your blood pressure. High blood pressure can also cause a sunconjunctival hemorrhage.  Severe trauma or blunt injuries.  Diseases that affect blood clotting (hemophilia, leukemia).  Abnormalities of blood vessels behind the eye  (carotid cavernous sinus fistula).  Tumors behind the eye.  Certain drugs (aspirin, coumadin, heparin).  Recent eye surgery. HOME CARE INSTRUCTIONS   Do not worry about the appearance of your eye. You may continue your usual activities.  Often, follow-up is not necessary. SEEK MEDICAL CARE IF:   Your eye becomes painful.  The bleeding does not disappear within 3 weeks.  Bleeding occurs elsewhere, for example, under the skin, in the mouth, or in the other eye.  You have recurring subconjunctival hemorrhages. SEEK IMMEDIATE MEDICAL CARE IF:   Your vision changes or you have difficulty seeing.  You develop severe headache, persistent vomiting, confusion, or abnormal drowsiness (lethargy).  Your eye seems to bulge or protrude from the eye socket.  You notice the sudden appearance of bruises, or have spontaneous bleeding elsewhere on your body. Document Released: 12/11/2005 Document Revised: 03/04/2012 Document Reviewed: 11/08/2009 [facility] Patient Information 2013 [place].

## 2013-04-09 NOTE — ED Notes (Signed)
Pt would not wait after injection, received sandwich and wanted to leave
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
## 2013-04-10 NOTE — ED Provider Notes (Signed)
Medical screening examination/treatment/procedure(s) were performed by non-physician practitioner and as supervising physician I was immediately available for consultation/collaboration.   Alfonzo Feller, DO 04/10/13 1520

## 2013-04-11 NOTE — ED Provider Notes (Signed)
Medical screening examination/treatment/procedure(s) were performed by non-physician practitioner and as supervising physician I was immediately available for consultation/collaboration.   Babette Relic, MD 04/11/13 2037

## 2013-06-29 ENCOUNTER — Emergency Department (HOSPITAL_COMMUNITY)
Admission: EM | Admit: 2013-06-29 | Discharge: 2013-06-29 | Disposition: A | Discharge status: Home / Self Care | Payer: Medicare Other | Attending: Emergency Medicine | Admitting: Emergency Medicine

## 2013-06-29 ENCOUNTER — Encounter (HOSPITAL_COMMUNITY): Payer: Self-pay | Admitting: Emergency Medicine

## 2013-06-29 DIAGNOSIS — Z8639 Personal history of other endocrine, nutritional and metabolic disease: Secondary | ICD-10-CM | POA: Insufficient documentation

## 2013-06-29 DIAGNOSIS — I10 Essential (primary) hypertension: Secondary | ICD-10-CM | POA: Insufficient documentation

## 2013-06-29 DIAGNOSIS — Z87828 Personal history of other (healed) physical injury and trauma: Secondary | ICD-10-CM | POA: Insufficient documentation

## 2013-06-29 DIAGNOSIS — F209 Schizophrenia, unspecified: Secondary | ICD-10-CM | POA: Insufficient documentation

## 2013-06-29 DIAGNOSIS — Z8739 Personal history of other diseases of the musculoskeletal system and connective tissue: Secondary | ICD-10-CM | POA: Insufficient documentation

## 2013-06-29 DIAGNOSIS — F411 Generalized anxiety disorder: Secondary | ICD-10-CM | POA: Insufficient documentation

## 2013-06-29 DIAGNOSIS — F17200 Nicotine dependence, unspecified, uncomplicated: Secondary | ICD-10-CM | POA: Insufficient documentation

## 2013-06-29 DIAGNOSIS — G8929 Other chronic pain: Secondary | ICD-10-CM | POA: Insufficient documentation

## 2013-06-29 DIAGNOSIS — Z862 Personal history of diseases of the blood and blood-forming organs and certain disorders involving the immune mechanism: Secondary | ICD-10-CM | POA: Insufficient documentation

## 2013-06-29 DIAGNOSIS — IMO0002 Reserved for concepts with insufficient information to code with codable children: Secondary | ICD-10-CM | POA: Insufficient documentation

## 2013-06-29 DIAGNOSIS — M25561 Pain in right knee: Secondary | ICD-10-CM

## 2013-06-29 DIAGNOSIS — F319 Bipolar disorder, unspecified: Secondary | ICD-10-CM | POA: Insufficient documentation

## 2013-06-29 DIAGNOSIS — T07XXXA Unspecified multiple injuries, initial encounter: Secondary | ICD-10-CM

## 2013-06-29 DIAGNOSIS — Z79899 Other long term (current) drug therapy: Secondary | ICD-10-CM | POA: Insufficient documentation

## 2013-06-29 MED ORDER — NAPROXEN 250 MG PO TABS
500.0000 mg | ORAL_TABLET | Freq: Once | ORAL | Status: AC
  Administered 2013-06-29: 500 mg via ORAL
  Filled 2013-06-29: qty 2

## 2013-06-29 MED ORDER — CYCLOBENZAPRINE HCL 10 MG PO TABS: 10.0000 mg | ORAL_TABLET | Freq: Two times a day (BID) | ORAL | Status: DC | PRN

## 2013-06-29 MED ORDER — NAPROXEN 500 MG PO TABS: 500.0000 mg | ORAL_TABLET | Freq: Two times a day (BID) | ORAL | Status: DC

## 2013-06-29 NOTE — Discharge Instructions (Signed)
1. Make an appointment with Dr. Jimmye Norman on Monday morning.  2. You have been prescribed Naprosyn for pain. Please take one pill twice a day for 3 days and then, as needed. You have been prescribed for Flexeril as a muscle relaxer. Please take 1 or 2 pills up to three times a days as needed.  3. Chronic Pain Discharge Instructions  Emergency care providers appreciate that many patients coming to [place] are in severe pain and we wish to address their pain in the safest, most responsible manner.  It is important to recognize however, that the proper treatment of chronic pain differs from that of the pain of injuries and acute illnesses.  Our goal is to provide quality, safe, personalized care and we thank you for giving [place] the opportunity to serve you. The use of narcotics and related agents for chronic pain syndromes may lead to additional physical and psychological problems.  Nearly as many people die from prescription narcotics each year as die from car crashes.  Additionally, this risk is increased if such prescriptions are obtained from a variety of sources.  Therefore, only your primary care physician or a pain management specialist is able to safely treat such syndromes with narcotic medications long-term.    Documentation revealing such prescriptions have been sought from multiple sources may prohibit [place] from providing a refill or different narcotic medication.  Your name may be checked first through the [place].  This database is a record of controlled substance medication prescriptions that the patient has received.  This has been established by [facility] in an effort to eliminate the dangerous, and often life threatening, practice of obtaining multiple prescriptions from different medical providers.   If you have a chronic pain syndrome (i.e. chronic headaches, recurrent back or neck pain, dental pain, abdominal or pelvis pain without a specific  diagnosis, or neuropathic pain such as fibromyalgia) or recurrent visits for the same condition without an acute diagnosis, you may be treated with non-narcotics and other non-addictive medicines.  Allergic reactions or negative side effects that may be reported by a patient to such medications will not typically lead to the use of a narcotic analgesic or other controlled substance as an alternative.   Patients managing chronic pain with a personal physician should have provisions in place for breakthrough pain.  If you are in crisis, you should call your physician.  If your physician directs you to the emergency department, please have the doctor call and speak to our attending physician concerning your care.   When patients come to the Emergency Department (ED) with acute medical conditions in which the Emergency Department physician feels appropriate to prescribe narcotic or sedating pain medication, the physician will prescribe these in very limited quantities.  The amount of these medications will last only until you can see your primary care physician in his/her office.  Any patient who returns to the ED seeking refills should expect only non-narcotic pain medications.   In the event of an acute medical condition exists and the emergency physician feels it is necessary that the patient be given a narcotic or sedating medication -  a responsible adult driver should be present in the room prior to the medication being given by the nurse.   Prescriptions for narcotic or sedating medications that have been lost, stolen or expired will not be refilled in the Emergency Department.    Patients who have chronic pain may receive non-narcotic prescriptions until seen by  their primary care physician.  It is every patient's personal responsibility to maintain active prescriptions with his or her primary care physician or specialist.
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
## 2013-06-29 NOTE — ED Provider Notes (Signed)
Medical screening examination/treatment/procedure(s) were performed by non-physician practitioner and as supervising physician I was immediately available for consultation/collaboration.  Djuan Talton R. Jaser Fullen, MD 06/29/13 2348 
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
## 2014-01-31 ENCOUNTER — Encounter (HOSPITAL_COMMUNITY): Payer: Self-pay | Admitting: Emergency Medicine

## 2014-01-31 ENCOUNTER — Emergency Department (HOSPITAL_COMMUNITY): Payer: Medicare Other

## 2014-01-31 ENCOUNTER — Emergency Department (HOSPITAL_COMMUNITY)
Admission: EM | Admit: 2014-01-31 | Discharge: 2014-01-31 | Disposition: A | Discharge status: Home / Self Care | Payer: Medicare Other | Attending: Emergency Medicine | Admitting: Emergency Medicine

## 2014-01-31 DIAGNOSIS — R0602 Shortness of breath: Secondary | ICD-10-CM | POA: Insufficient documentation

## 2014-01-31 DIAGNOSIS — G8929 Other chronic pain: Secondary | ICD-10-CM | POA: Insufficient documentation

## 2014-01-31 DIAGNOSIS — I10 Essential (primary) hypertension: Secondary | ICD-10-CM | POA: Insufficient documentation

## 2014-01-31 DIAGNOSIS — Z87828 Personal history of other (healed) physical injury and trauma: Secondary | ICD-10-CM | POA: Insufficient documentation

## 2014-01-31 DIAGNOSIS — R112 Nausea with vomiting, unspecified: Secondary | ICD-10-CM

## 2014-01-31 DIAGNOSIS — F17200 Nicotine dependence, unspecified, uncomplicated: Secondary | ICD-10-CM | POA: Insufficient documentation

## 2014-01-31 DIAGNOSIS — R109 Unspecified abdominal pain: Secondary | ICD-10-CM

## 2014-01-31 DIAGNOSIS — Z862 Personal history of diseases of the blood and blood-forming organs and certain disorders involving the immune mechanism: Secondary | ICD-10-CM | POA: Insufficient documentation

## 2014-01-31 DIAGNOSIS — F22 Delusional disorders: Secondary | ICD-10-CM | POA: Insufficient documentation

## 2014-01-31 DIAGNOSIS — Z79899 Other long term (current) drug therapy: Secondary | ICD-10-CM | POA: Insufficient documentation

## 2014-01-31 DIAGNOSIS — IMO0002 Reserved for concepts with insufficient information to code with codable children: Secondary | ICD-10-CM | POA: Insufficient documentation

## 2014-01-31 DIAGNOSIS — Z8639 Personal history of other endocrine, nutritional and metabolic disease: Secondary | ICD-10-CM | POA: Insufficient documentation

## 2014-01-31 DIAGNOSIS — M129 Arthropathy, unspecified: Secondary | ICD-10-CM | POA: Insufficient documentation

## 2014-01-31 DIAGNOSIS — K2920 Alcoholic gastritis without bleeding: Secondary | ICD-10-CM

## 2014-01-31 DIAGNOSIS — Z8659 Personal history of other mental and behavioral disorders: Secondary | ICD-10-CM | POA: Insufficient documentation

## 2014-01-31 DIAGNOSIS — K5900 Constipation, unspecified: Secondary | ICD-10-CM | POA: Insufficient documentation

## 2014-01-31 LAB — COMPREHENSIVE METABOLIC PANEL
ALT: 34 U/L (ref 0–53)
AST: 36 U/L (ref 0–37)
Albumin: 3.9 g/dL (ref 3.5–5.2)
Alkaline Phosphatase: 72 U/L (ref 39–117)
BUN: 9 mg/dL (ref 6–23)
CO2: 26 mEq/L (ref 19–32)
Calcium: 9.4 mg/dL (ref 8.4–10.5)
Chloride: 88 mEq/L — ABNORMAL LOW (ref 96–112)
Creatinine, Ser: 0.71 mg/dL (ref 0.50–1.35)
GFR calc Af Amer: >90 mL/min (ref >90)
GFR calc non Af Amer: >90 mL/min (ref >90)
Glucose, Bld: 109 mg/dL — ABNORMAL HIGH (ref 70–99)
Potassium: 3.4 mEq/L — ABNORMAL LOW (ref 3.7–5.3)
Sodium: 132 mEq/L — ABNORMAL LOW (ref 137–147)
Total Bilirubin: 0.4 mg/dL (ref 0.3–1.2)
Total Protein: 8.1 g/dL (ref 6.0–8.3)

## 2014-01-31 LAB — CBC WITH DIFFERENTIAL/PLATELET
Basophils Absolute: 0 10*3/uL (ref 0.0–0.1)
Basophils Relative: 0 % (ref 0–1)
Eosinophils Absolute: 0 10*3/uL (ref 0.0–0.7)
Eosinophils Relative: 0 % (ref 0–5)
HCT: 42.3 % (ref 39.0–52.0)
Hemoglobin: 14.6 g/dL (ref 13.0–17.0)
Lymphocytes Relative: 25 % (ref 12–46)
Lymphs Abs: 2.8 10*3/uL (ref 0.7–4.0)
MCH: 28.9 pg (ref 26.0–34.0)
MCHC: 34.5 g/dL (ref 30.0–36.0)
MCV: 83.8 fL (ref 78.0–100.0)
Monocytes Absolute: 0.7 10*3/uL (ref 0.1–1.0)
Monocytes Relative: 6 % (ref 3–12)
Neutro Abs: 7.8 10*3/uL — ABNORMAL HIGH (ref 1.7–7.7)
Neutrophils Relative %: 69 % (ref 43–77)
Platelets: 318 10*3/uL (ref 150–400)
RBC: 5.05 MIL/uL (ref 4.22–5.81)
RDW: 13.7 % (ref 11.5–15.5)
WBC: 11.4 10*3/uL — ABNORMAL HIGH (ref 4.0–10.5)

## 2014-01-31 LAB — URINALYSIS, ROUTINE W REFLEX MICROSCOPIC
Bilirubin Urine: NEGATIVE
Glucose, UA: NEGATIVE mg/dL
Hgb urine dipstick: NEGATIVE
Ketones, ur: NEGATIVE mg/dL
Leukocytes, UA: NEGATIVE
Nitrite: NEGATIVE
Protein, ur: NEGATIVE mg/dL
Specific Gravity, Urine: 1.024 (ref 1.005–1.030)
Urobilinogen, UA: 1 mg/dL (ref 0.0–1.0)
pH: 7.5 (ref 5.0–8.0)

## 2014-01-31 LAB — POCT I-STAT TROPONIN I: Troponin i, poc: 0.02 ng/mL (ref 0.00–0.08)

## 2014-01-31 LAB — LIPASE, BLOOD: Lipase: 9 U/L — ABNORMAL LOW (ref 11–59)

## 2014-01-31 MED ORDER — OMEPRAZOLE 40 MG PO CPDR: 40.0000 mg | DELAYED_RELEASE_CAPSULE | Freq: Every day | ORAL | Status: DC

## 2014-01-31 MED ORDER — ONDANSETRON HCL 4 MG/2ML IJ SOLN
4.0000 mg | Freq: Once | INTRAMUSCULAR | Status: AC
  Administered 2014-01-31: 4 mg via INTRAVENOUS
  Filled 2014-01-31: qty 2

## 2014-01-31 MED ORDER — MORPHINE SULFATE 4 MG/ML IJ SOLN
6.0000 mg | Freq: Once | INTRAMUSCULAR | Status: AC
  Administered 2014-01-31: 6 mg via INTRAVENOUS
  Filled 2014-01-31: qty 2

## 2014-01-31 MED ORDER — PANTOPRAZOLE SODIUM 40 MG IV SOLR
40.0000 mg | Freq: Once | INTRAVENOUS | Status: AC
  Administered 2014-01-31: 40 mg via INTRAVENOUS
  Filled 2014-01-31: qty 40

## 2014-01-31 MED ORDER — GI COCKTAIL ~~LOC~~
30.0000 mL | Freq: Once | ORAL | Status: AC
  Administered 2014-01-31: 30 mL via ORAL
  Filled 2014-01-31: qty 30

## 2014-01-31 MED ORDER — ONDANSETRON HCL 4 MG/2ML IJ SOLN: 4.0000 mg | Freq: Once | INTRAMUSCULAR | Status: DC

## 2014-01-31 MED ORDER — SODIUM CHLORIDE 0.9 % IV BOLUS (SEPSIS)
1000.0000 mL | Freq: Once | INTRAVENOUS | Status: AC
  Administered 2014-01-31: 1000 mL via INTRAVENOUS

## 2014-01-31 MED ORDER — PROMETHAZINE HCL 25 MG/ML IJ SOLN
25.0000 mg | Freq: Once | INTRAMUSCULAR | Status: AC
  Administered 2014-01-31: 25 mg via INTRAVENOUS
  Filled 2014-01-31: qty 1

## 2014-01-31 NOTE — Discharge Instructions (Signed)
Follow up with your primary care doctor in 2 days. Take medication (omeprazole) daily in the morning prior to eating. Avoid alcohol and caffeine use as these may exacerbate your symptoms. Return to the ED should you develop worsening Nausea/vomiting or abdominal pain.

## 2014-01-31 NOTE — ED Notes (Signed)
Per EMS: Pt states he drank too much last night (3) (40 oz)and also drank 4 energy drinks. Has been having NV, low abd pain since then.
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
## 2014-01-31 NOTE — ED Notes (Signed)
Pt sts that chest pain has been going on 6 months. Pain in center of chest.  Pt cannot describe the chest pain. A&Ox4.

## 2014-01-31 NOTE — ED Notes (Signed)
Pt reports n/v since 0300 this am after drinking ETOH. Emesis x11. Denies abd pain. Sts he also wants a chest xray because he has been having chest pain x1 month and smokes cigarettes. Sts pain is worse with a deep breath, all over chest. "feels like my lungs are about to collapse." Also reports leg pain which admits is chronic but does not want to be seen for that today.

## 2014-02-03 NOTE — ED Provider Notes (Signed)
Medical screening examination/treatment/procedure(s) were performed by non-physician practitioner and as supervising physician I was immediately available for consultation/collaboration.   Leota Jacobsen, MD 02/03/14 [phone]

## 2014-02-14 ENCOUNTER — Emergency Department (HOSPITAL_COMMUNITY)
Admission: EM | Admit: 2014-02-14 | Discharge: 2014-02-15 | Disposition: A | Discharge status: Home / Self Care | Payer: Medicare Other | Attending: Emergency Medicine | Admitting: Emergency Medicine

## 2014-02-14 ENCOUNTER — Encounter (HOSPITAL_COMMUNITY): Payer: Self-pay | Admitting: Emergency Medicine

## 2014-02-14 DIAGNOSIS — S0100XA Unspecified open wound of scalp, initial encounter: Secondary | ICD-10-CM | POA: Insufficient documentation

## 2014-02-14 DIAGNOSIS — G8929 Other chronic pain: Secondary | ICD-10-CM | POA: Insufficient documentation

## 2014-02-14 DIAGNOSIS — S40029A Contusion of unspecified upper arm, initial encounter: Secondary | ICD-10-CM | POA: Insufficient documentation

## 2014-02-14 DIAGNOSIS — M25529 Pain in unspecified elbow: Secondary | ICD-10-CM

## 2014-02-14 DIAGNOSIS — I10 Essential (primary) hypertension: Secondary | ICD-10-CM | POA: Insufficient documentation

## 2014-02-14 DIAGNOSIS — Y9389 Activity, other specified: Secondary | ICD-10-CM | POA: Insufficient documentation

## 2014-02-14 DIAGNOSIS — F319 Bipolar disorder, unspecified: Secondary | ICD-10-CM | POA: Insufficient documentation

## 2014-02-14 DIAGNOSIS — Z23 Encounter for immunization: Secondary | ICD-10-CM | POA: Insufficient documentation

## 2014-02-14 DIAGNOSIS — Z79899 Other long term (current) drug therapy: Secondary | ICD-10-CM | POA: Insufficient documentation

## 2014-02-14 DIAGNOSIS — IMO0002 Reserved for concepts with insufficient information to code with codable children: Secondary | ICD-10-CM

## 2014-02-14 DIAGNOSIS — F209 Schizophrenia, unspecified: Secondary | ICD-10-CM | POA: Insufficient documentation

## 2014-02-14 DIAGNOSIS — Y92009 Unspecified place in unspecified non-institutional (private) residence as the place of occurrence of the external cause: Secondary | ICD-10-CM | POA: Insufficient documentation

## 2014-02-14 DIAGNOSIS — F17200 Nicotine dependence, unspecified, uncomplicated: Secondary | ICD-10-CM | POA: Insufficient documentation

## 2014-02-14 DIAGNOSIS — F411 Generalized anxiety disorder: Secondary | ICD-10-CM | POA: Insufficient documentation

## 2014-02-14 DIAGNOSIS — M109 Gout, unspecified: Secondary | ICD-10-CM | POA: Insufficient documentation

## 2014-02-14 DIAGNOSIS — S41109A Unspecified open wound of unspecified upper arm, initial encounter: Secondary | ICD-10-CM | POA: Insufficient documentation

## 2014-02-14 DIAGNOSIS — X500XXA Overexertion from strenuous movement or load, initial encounter: Secondary | ICD-10-CM | POA: Insufficient documentation
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
## 2014-02-15 ENCOUNTER — Emergency Department (HOSPITAL_COMMUNITY): Payer: Medicare Other

## 2014-02-15 MED ORDER — DICLOFENAC SODIUM 1 % TD GEL: 2.0000 g | Freq: Two times a day (BID) | TRANSDERMAL | Status: DC | PRN

## 2014-02-15 MED ORDER — HYDROCHLOROTHIAZIDE 25 MG PO TABS: 25.0000 mg | ORAL_TABLET | Freq: Every morning | ORAL | Status: DC

## 2014-02-15 MED ORDER — BENZTROPINE MESYLATE 1 MG PO TABS: 2.0000 mg | ORAL_TABLET | Freq: Every morning | ORAL | Status: DC

## 2014-02-15 MED ORDER — ALUM & MAG HYDROXIDE-SIMETH 200-200-20 MG/5ML PO SUSP: 30.0000 mL | ORAL | Status: DC | PRN

## 2014-02-15 MED ORDER — TETANUS-DIPHTH-ACELL PERTUSSIS 5-2.5-18.5 LF-MCG/0.5 IM SUSP
0.5000 mL | Freq: Once | INTRAMUSCULAR | Status: AC
  Administered 2014-02-15: 0.5 mL via INTRAMUSCULAR
  Filled 2014-02-15: qty 0.5

## 2014-02-15 MED ORDER — ONDANSETRON HCL 4 MG PO TABS: 4.0000 mg | ORAL_TABLET | Freq: Three times a day (TID) | ORAL | Status: DC | PRN

## 2014-02-15 MED ORDER — RISPERIDONE MICROSPHERES 37.5 MG IM SUSR: 37.5000 mg | INTRAMUSCULAR | Status: DC

## 2014-02-15 MED ORDER — CARVEDILOL 25 MG PO TABS
25.0000 mg | ORAL_TABLET | Freq: Two times a day (BID) | ORAL | Status: DC
Start: 2014-02-15 — End: 2014-02-15

## 2014-02-15 MED ORDER — LORAZEPAM 1 MG PO TABS: 1.0000 mg | ORAL_TABLET | Freq: Three times a day (TID) | ORAL | Status: DC | PRN

## 2014-02-15 MED ORDER — PANTOPRAZOLE SODIUM 40 MG PO TBEC: 40.0000 mg | DELAYED_RELEASE_TABLET | Freq: Every day | ORAL | Status: DC

## 2014-02-15 MED ORDER — HYDROCODONE-ACETAMINOPHEN 5-325 MG PO TABS: 1.0000 | ORAL_TABLET | ORAL | Status: DC | PRN

## 2014-02-15 MED ORDER — IBUPROFEN 200 MG PO TABS: 600.0000 mg | ORAL_TABLET | Freq: Three times a day (TID) | ORAL | Status: DC | PRN

## 2014-02-15 MED ORDER — FLUTICASONE PROPIONATE 50 MCG/ACT NA SUSP: 2.0000 | Freq: Every day | NASAL | Status: DC

## 2014-02-15 MED ORDER — LORATADINE 10 MG PO TABS: 10.0000 mg | ORAL_TABLET | Freq: Every day | ORAL | Status: DC
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
## 2014-02-15 NOTE — Discharge Instructions (Signed)
Take vicodin as prescribed for severe pain.  Do not drive within four hours of taking this medication (may cause drowsiness or confusion).   Take ibuprofen as well; up to 800mg  three times a day with food.  Apply ice to elbow and Avoid activities that aggravate pain.   Keep wounds clean and dry.  Return to ER if you develop fever, worsening pain or redness/drainage of pus at site of wounds.

## 2014-02-16 NOTE — ED Provider Notes (Signed)
Medical screening examination/treatment/procedure(s) were performed by non-physician practitioner and as supervising physician I was immediately available for consultation/collaboration.    Johnna Acosta, MD 02/16/14 [phone]

## 2014-03-11 ENCOUNTER — Encounter (HOSPITAL_COMMUNITY): Payer: Self-pay | Admitting: Emergency Medicine

## 2014-03-11 ENCOUNTER — Emergency Department (HOSPITAL_COMMUNITY)
Admission: EM | Admit: 2014-03-11 | Discharge: 2014-03-11 | Disposition: A | Discharge status: Home / Self Care | Payer: Medicare Other | Attending: Emergency Medicine | Admitting: Emergency Medicine

## 2014-03-11 ENCOUNTER — Emergency Department (HOSPITAL_COMMUNITY): Payer: Medicare Other

## 2014-03-11 DIAGNOSIS — G8911 Acute pain due to trauma: Secondary | ICD-10-CM | POA: Insufficient documentation

## 2014-03-11 DIAGNOSIS — I10 Essential (primary) hypertension: Secondary | ICD-10-CM | POA: Insufficient documentation

## 2014-03-11 DIAGNOSIS — F17200 Nicotine dependence, unspecified, uncomplicated: Secondary | ICD-10-CM | POA: Insufficient documentation

## 2014-03-11 DIAGNOSIS — M109 Gout, unspecified: Secondary | ICD-10-CM | POA: Insufficient documentation

## 2014-03-11 DIAGNOSIS — Z79899 Other long term (current) drug therapy: Secondary | ICD-10-CM | POA: Insufficient documentation

## 2014-03-11 DIAGNOSIS — M25529 Pain in unspecified elbow: Secondary | ICD-10-CM | POA: Insufficient documentation

## 2014-03-11 DIAGNOSIS — M25521 Pain in right elbow: Secondary | ICD-10-CM

## 2014-03-11 DIAGNOSIS — M25429 Effusion, unspecified elbow: Secondary | ICD-10-CM | POA: Insufficient documentation

## 2014-03-11 DIAGNOSIS — F209 Schizophrenia, unspecified: Secondary | ICD-10-CM | POA: Insufficient documentation

## 2014-03-11 DIAGNOSIS — F319 Bipolar disorder, unspecified: Secondary | ICD-10-CM | POA: Insufficient documentation

## 2014-03-11 DIAGNOSIS — G8929 Other chronic pain: Secondary | ICD-10-CM | POA: Insufficient documentation

## 2014-03-11 DIAGNOSIS — F411 Generalized anxiety disorder: Secondary | ICD-10-CM | POA: Insufficient documentation

## 2014-03-11 DIAGNOSIS — M25421 Effusion, right elbow: Secondary | ICD-10-CM

## 2014-03-11 DIAGNOSIS — K219 Gastro-esophageal reflux disease without esophagitis: Secondary | ICD-10-CM | POA: Insufficient documentation

## 2014-03-11 MED ORDER — HYDROCODONE-ACETAMINOPHEN 5-325 MG PO TABS: 1.0000 | ORAL_TABLET | ORAL | Status: DC | PRN

## 2014-03-11 MED ORDER — OMEPRAZOLE 40 MG PO CPDR: 40.0000 mg | DELAYED_RELEASE_CAPSULE | Freq: Every day | ORAL | Status: DC

## 2014-03-11 NOTE — Discharge Instructions (Signed)
Continue to use sling as needed. Call your orthopedic doctor for an appointment.
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
## 2014-03-11 NOTE — ED Notes (Signed)
Per EMS: Pt c/o of R arm pain that has not gotten any better. Pt has full mobility of arm and no deformity or swelling noted. Pt alert and ambulatory.

## 2014-04-03 ENCOUNTER — Encounter (HOSPITAL_COMMUNITY): Payer: Self-pay | Admitting: Emergency Medicine

## 2014-04-03 ENCOUNTER — Emergency Department (HOSPITAL_COMMUNITY)
Admission: EM | Admit: 2014-04-03 | Discharge: 2014-04-03 | Disposition: A | Discharge status: Home / Self Care | Payer: Medicare Other | Attending: Emergency Medicine | Admitting: Emergency Medicine

## 2014-04-03 DIAGNOSIS — F411 Generalized anxiety disorder: Secondary | ICD-10-CM | POA: Insufficient documentation

## 2014-04-03 DIAGNOSIS — M129 Arthropathy, unspecified: Secondary | ICD-10-CM | POA: Insufficient documentation

## 2014-04-03 DIAGNOSIS — Z760 Encounter for issue of repeat prescription: Secondary | ICD-10-CM

## 2014-04-03 DIAGNOSIS — R002 Palpitations: Secondary | ICD-10-CM | POA: Insufficient documentation

## 2014-04-03 DIAGNOSIS — X58XXXA Exposure to other specified factors, initial encounter: Secondary | ICD-10-CM | POA: Insufficient documentation

## 2014-04-03 DIAGNOSIS — F17200 Nicotine dependence, unspecified, uncomplicated: Secondary | ICD-10-CM | POA: Insufficient documentation

## 2014-04-03 DIAGNOSIS — Y939 Activity, unspecified: Secondary | ICD-10-CM | POA: Insufficient documentation

## 2014-04-03 DIAGNOSIS — IMO0002 Reserved for concepts with insufficient information to code with codable children: Secondary | ICD-10-CM | POA: Insufficient documentation

## 2014-04-03 DIAGNOSIS — I10 Essential (primary) hypertension: Secondary | ICD-10-CM | POA: Insufficient documentation

## 2014-04-03 DIAGNOSIS — Y929 Unspecified place or not applicable: Secondary | ICD-10-CM | POA: Insufficient documentation

## 2014-04-03 DIAGNOSIS — F319 Bipolar disorder, unspecified: Secondary | ICD-10-CM | POA: Insufficient documentation

## 2014-04-03 DIAGNOSIS — F200 Paranoid schizophrenia: Secondary | ICD-10-CM | POA: Insufficient documentation

## 2014-04-03 DIAGNOSIS — G8929 Other chronic pain: Secondary | ICD-10-CM | POA: Insufficient documentation

## 2014-04-03 DIAGNOSIS — Z79899 Other long term (current) drug therapy: Secondary | ICD-10-CM | POA: Insufficient documentation

## 2014-04-03 DIAGNOSIS — T07XXXA Unspecified multiple injuries, initial encounter: Secondary | ICD-10-CM

## 2014-04-03 DIAGNOSIS — Z862 Personal history of diseases of the blood and blood-forming organs and certain disorders involving the immune mechanism: Secondary | ICD-10-CM | POA: Insufficient documentation

## 2014-04-03 DIAGNOSIS — R000 Tachycardia, unspecified: Secondary | ICD-10-CM | POA: Insufficient documentation

## 2014-04-03 DIAGNOSIS — E876 Hypokalemia: Secondary | ICD-10-CM

## 2014-04-03 DIAGNOSIS — Z8639 Personal history of other endocrine, nutritional and metabolic disease: Secondary | ICD-10-CM | POA: Insufficient documentation

## 2014-04-03 LAB — I-STAT CHEM 8, ED
BUN: <3 mg/dL — ABNORMAL LOW (ref 6–23)
Calcium, Ion: 1.12 mmol/L (ref 1.12–1.23)
Chloride: 105 mEq/L (ref 96–112)
Creatinine, Ser: 0.8 mg/dL (ref 0.50–1.35)
Glucose, Bld: 112 mg/dL — ABNORMAL HIGH (ref 70–99)
HCT: 40 % (ref 39.0–52.0)
Hemoglobin: 13.6 g/dL (ref 13.0–17.0)
Potassium: 2.7 mEq/L — CL (ref 3.7–5.3)
Sodium: 141 mEq/L (ref 137–147)
TCO2: 18 mmol/L (ref 0–100)

## 2014-04-03 LAB — CBG MONITORING, ED: Glucose-Capillary: 172 mg/dL — ABNORMAL HIGH (ref 70–99)

## 2014-04-03 MED ORDER — ZOLPIDEM TARTRATE 10 MG PO TABS: 10.0000 mg | ORAL_TABLET | Freq: Every day | ORAL | Status: DC

## 2014-04-03 MED ORDER — POTASSIUM CHLORIDE CRYS ER 20 MEQ PO TBCR
40.0000 meq | EXTENDED_RELEASE_TABLET | Freq: Once | ORAL | Status: AC
  Administered 2014-04-03: 40 meq via ORAL
  Filled 2014-04-03: qty 2

## 2014-04-03 MED ORDER — HYDROCODONE-ACETAMINOPHEN 5-325 MG PO TABS: 1.0000 | ORAL_TABLET | ORAL | Status: DC | PRN

## 2014-04-03 MED ORDER — DICLOFENAC SODIUM 1 % TD GEL: 2.0000 g | Freq: Two times a day (BID) | TRANSDERMAL | Status: DC | PRN

## 2014-04-03 MED ORDER — CARVEDILOL 25 MG PO TABS: 25.0000 mg | ORAL_TABLET | Freq: Two times a day (BID) | ORAL | Status: DC

## 2014-04-03 MED ORDER — FLUOXETINE HCL 20 MG PO CAPS: 20.0000 mg | ORAL_CAPSULE | Freq: Every day | ORAL | Status: DC

## 2014-04-03 MED ORDER — HYDROCHLOROTHIAZIDE 25 MG PO TABS: 25.0000 mg | ORAL_TABLET | Freq: Every morning | ORAL | Status: DC

## 2014-04-03 MED ORDER — BENZTROPINE MESYLATE 2 MG PO TABS: 2.0000 mg | ORAL_TABLET | Freq: Every morning | ORAL | Status: DC

## 2014-04-03 NOTE — ED Notes (Signed)
Maryan Rued, MD made aware of abnormal lab test results

## 2014-04-03 NOTE — Discharge Instructions (Signed)
Abrasions An abrasion is a cut or scrape of the skin. Abrasions do not go through all layers of the skin. HOME CARE  If a bandage (dressing) was put on your wound, change it as told by your doctor. If the bandage sticks, soak it off with warm.  Wash the area with water and soap 2 times a day. Rinse off the soap. Pat the area dry with a clean towel.  Put on medicated cream (ointment) as told by your doctor.  Change your bandage right away if it gets wet or dirty.  Only take medicine as told by your doctor.  See your doctor within 24 48 hours to get your wound checked.  Check your wound for redness, puffiness (swelling), or yellowish-white fluid (pus). GET HELP RIGHT AWAY IF:   You have more pain in the wound.  You have redness, swelling, or tenderness around the wound.  You have pus coming from the wound.  You have a fever or lasting symptoms for more than 2 3 days.  You have a fever and your symptoms suddenly get worse.  You have a bad smell coming from the wound or bandage. MAKE SURE YOU:   Understand these instructions.  Will watch your condition.  Will get help right away if you are not doing well or get worse. Document Released: 05/29/2008 Document Revised: 09/04/2012 Document Reviewed: 11/14/2011 [facility] Patient Information 2014 [place], [place].  Hypokalemia Hypokalemia means that the amount of potassium in the blood is lower than normal.Potassium is a chemical, called an electrolyte, that helps regulate the amount of fluid in the body. It also stimulates muscle contraction and helps nerves function properly.Most of the body's potassium is inside of cells, and only a very small amount is in the blood. Because the amount in the blood is so small, minor changes can be life-threatening. CAUSES  Antibiotics.  Diarrhea or vomiting.  Using laxatives too much, which can cause diarrhea.  Chronic kidney disease.  Water pills (diuretics).  Eating disorders  (bulimia).  Low magnesium level.  Sweating a lot. SIGNS AND SYMPTOMS  Weakness.  Constipation.  Fatigue.  Muscle cramps.  Mental confusion.  Skipped heartbeats or irregular heartbeat (palpitations).  Tingling or numbness. DIAGNOSIS  Your health care provider can diagnose hypokalemia with blood tests. In addition to checking your potassium level, your health care provider may also check other lab tests. TREATMENT Hypokalemia can be treated with potassium supplements taken by mouth or adjustments in your current medicines. If your potassium level is very low, you may need to get potassium through a vein (IV) and be monitored in the hospital. A diet high in potassium is also helpful. Foods high in potassium are:  Nuts, such as peanuts and pistachios.  Seeds, such as sunflower seeds and pumpkin seeds.  Peas, lentils, and lima beans.  Whole grain and bran cereals and breads.  Fresh fruit and vegetables, such as apricots, avocado, bananas, cantaloupe, kiwi, oranges, tomatoes, asparagus, and potatoes.  Orange and tomato juices.  Red meats.  Fruit yogurt. HOME CARE INSTRUCTIONS  Take all medicines as prescribed by your health care provider.  Maintain a healthy diet by including nutritious food, such as fruits, vegetables, nuts, whole grains, and lean meats.  If you are taking a laxative, be sure to follow the directions on the label. SEEK MEDICAL CARE IF:  Your weakness gets worse.  You feel your heart pounding or racing.  You are vomiting or having diarrhea.  You are diabetic and having trouble keeping  your blood glucose in the normal range. SEEK IMMEDIATE MEDICAL CARE IF:  You have chest pain, shortness of breath, or dizziness.  You are vomiting or having diarrhea for more than 2 days.  You faint. MAKE SURE YOU:   Understand these instructions.  Will watch your condition.  Will get help right away if you are not doing well or get worse. Document  Released: 12/11/2005 Document Revised: 10/01/2013 Document Reviewed: 06/13/2013 [facility] Patient Information 2014 [place].
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
## 2014-04-07 ENCOUNTER — Emergency Department (HOSPITAL_COMMUNITY)
Admission: EM | Admit: 2014-04-07 | Discharge: 2014-04-08 | Disposition: A | Discharge status: Home / Self Care | Payer: Medicare Other | Attending: Emergency Medicine | Admitting: Emergency Medicine

## 2014-04-07 ENCOUNTER — Encounter (HOSPITAL_COMMUNITY): Payer: Self-pay | Admitting: Emergency Medicine

## 2014-04-07 DIAGNOSIS — Y939 Activity, unspecified: Secondary | ICD-10-CM | POA: Insufficient documentation

## 2014-04-07 DIAGNOSIS — M129 Arthropathy, unspecified: Secondary | ICD-10-CM | POA: Insufficient documentation

## 2014-04-07 DIAGNOSIS — F209 Schizophrenia, unspecified: Secondary | ICD-10-CM | POA: Insufficient documentation

## 2014-04-07 DIAGNOSIS — X58XXXA Exposure to other specified factors, initial encounter: Secondary | ICD-10-CM | POA: Insufficient documentation

## 2014-04-07 DIAGNOSIS — Z8639 Personal history of other endocrine, nutritional and metabolic disease: Secondary | ICD-10-CM | POA: Insufficient documentation

## 2014-04-07 DIAGNOSIS — Y929 Unspecified place or not applicable: Secondary | ICD-10-CM | POA: Insufficient documentation

## 2014-04-07 DIAGNOSIS — Z79899 Other long term (current) drug therapy: Secondary | ICD-10-CM | POA: Insufficient documentation

## 2014-04-07 DIAGNOSIS — Z87828 Personal history of other (healed) physical injury and trauma: Secondary | ICD-10-CM | POA: Insufficient documentation

## 2014-04-07 DIAGNOSIS — T148XXA Other injury of unspecified body region, initial encounter: Secondary | ICD-10-CM

## 2014-04-07 DIAGNOSIS — G8929 Other chronic pain: Secondary | ICD-10-CM | POA: Insufficient documentation

## 2014-04-07 DIAGNOSIS — F17200 Nicotine dependence, unspecified, uncomplicated: Secondary | ICD-10-CM | POA: Insufficient documentation

## 2014-04-07 DIAGNOSIS — F319 Bipolar disorder, unspecified: Secondary | ICD-10-CM | POA: Insufficient documentation

## 2014-04-07 DIAGNOSIS — R238 Other skin changes: Secondary | ICD-10-CM

## 2014-04-07 DIAGNOSIS — S61209A Unspecified open wound of unspecified finger without damage to nail, initial encounter: Secondary | ICD-10-CM | POA: Insufficient documentation

## 2014-04-07 DIAGNOSIS — S81809A Unspecified open wound, unspecified lower leg, initial encounter: Secondary | ICD-10-CM

## 2014-04-07 DIAGNOSIS — S91009A Unspecified open wound, unspecified ankle, initial encounter: Secondary | ICD-10-CM

## 2014-04-07 DIAGNOSIS — S81009A Unspecified open wound, unspecified knee, initial encounter: Secondary | ICD-10-CM | POA: Insufficient documentation

## 2014-04-07 DIAGNOSIS — Z862 Personal history of diseases of the blood and blood-forming organs and certain disorders involving the immune mechanism: Secondary | ICD-10-CM | POA: Insufficient documentation

## 2014-04-07 DIAGNOSIS — I10 Essential (primary) hypertension: Secondary | ICD-10-CM | POA: Insufficient documentation

## 2014-04-07 DIAGNOSIS — F411 Generalized anxiety disorder: Secondary | ICD-10-CM | POA: Insufficient documentation

## 2014-04-07 NOTE — ED Notes (Signed)
Pt sleeping in lobby. Pt wakes up and is ambulatory to exam room with steady independent gait.

## 2014-04-07 NOTE — ED Notes (Signed)
Pt has non healing wounds on R index finger and R elbow area. Pt states these areas have been here a week and a half. Alert and oriented.
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
## 2014-04-08 NOTE — ED Notes (Signed)
PA at bedside.

## 2014-04-08 NOTE — ED Provider Notes (Signed)
Medical screening examination/treatment/procedure(s) were performed by non-physician practitioner and as supervising physician I was immediately available for consultation/collaboration.   EKG Interpretation None        Delice Bison Ward, DO 04/08/14 [id number]

## 2014-04-08 NOTE — Discharge Instructions (Signed)
Follow up with the wound care center for further evaluation of finger wound.  Try to avoid picking at it. You can apply bacitracin twice a day to prevent infection.

## 2014-04-23 ENCOUNTER — Encounter (HOSPITAL_COMMUNITY): Payer: Self-pay | Admitting: Emergency Medicine

## 2014-04-23 ENCOUNTER — Emergency Department (HOSPITAL_COMMUNITY)
Admission: EM | Admit: 2014-04-23 | Discharge: 2014-04-24 | Disposition: A | Discharge status: Home / Self Care | Payer: Medicare Other | Attending: Emergency Medicine | Admitting: Emergency Medicine

## 2014-04-23 ENCOUNTER — Emergency Department (HOSPITAL_COMMUNITY): Payer: Medicare Other

## 2014-04-23 DIAGNOSIS — F209 Schizophrenia, unspecified: Secondary | ICD-10-CM | POA: Insufficient documentation

## 2014-04-23 DIAGNOSIS — Z8639 Personal history of other endocrine, nutritional and metabolic disease: Secondary | ICD-10-CM | POA: Insufficient documentation

## 2014-04-23 DIAGNOSIS — S62307A Unspecified fracture of fifth metacarpal bone, left hand, initial encounter for closed fracture: Secondary | ICD-10-CM

## 2014-04-23 DIAGNOSIS — S99919A Unspecified injury of unspecified ankle, initial encounter: Secondary | ICD-10-CM | POA: Insufficient documentation

## 2014-04-23 DIAGNOSIS — Z862 Personal history of diseases of the blood and blood-forming organs and certain disorders involving the immune mechanism: Secondary | ICD-10-CM | POA: Insufficient documentation

## 2014-04-23 DIAGNOSIS — F17200 Nicotine dependence, unspecified, uncomplicated: Secondary | ICD-10-CM | POA: Insufficient documentation

## 2014-04-23 DIAGNOSIS — F319 Bipolar disorder, unspecified: Secondary | ICD-10-CM | POA: Insufficient documentation

## 2014-04-23 DIAGNOSIS — G8921 Chronic pain due to trauma: Secondary | ICD-10-CM | POA: Insufficient documentation

## 2014-04-23 DIAGNOSIS — F411 Generalized anxiety disorder: Secondary | ICD-10-CM | POA: Insufficient documentation

## 2014-04-23 DIAGNOSIS — S99929A Unspecified injury of unspecified foot, initial encounter: Secondary | ICD-10-CM

## 2014-04-23 DIAGNOSIS — Z79899 Other long term (current) drug therapy: Secondary | ICD-10-CM | POA: Insufficient documentation

## 2014-04-23 DIAGNOSIS — S298XXA Other specified injuries of thorax, initial encounter: Secondary | ICD-10-CM | POA: Insufficient documentation

## 2014-04-23 DIAGNOSIS — S62309A Unspecified fracture of unspecified metacarpal bone, initial encounter for closed fracture: Secondary | ICD-10-CM | POA: Insufficient documentation

## 2014-04-23 DIAGNOSIS — S8990XA Unspecified injury of unspecified lower leg, initial encounter: Secondary | ICD-10-CM | POA: Insufficient documentation

## 2014-04-23 DIAGNOSIS — I10 Essential (primary) hypertension: Secondary | ICD-10-CM | POA: Insufficient documentation

## 2014-04-23 DIAGNOSIS — M129 Arthropathy, unspecified: Secondary | ICD-10-CM | POA: Insufficient documentation

## 2014-04-23 DIAGNOSIS — S299XXA Unspecified injury of thorax, initial encounter: Secondary | ICD-10-CM

## 2014-04-23 NOTE — ED Notes (Signed)
While waiting in lobby pt has tightened wrist splint to left hand that was on when pt arrived to ER; pt states "it's not too tight" and tightens splint; pt advised to loosen splint to prevent constriction to hand and pt declines.
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
## 2014-04-24 MED ORDER — OXYCODONE-ACETAMINOPHEN 5-325 MG PO TABS
2.0000 | ORAL_TABLET | Freq: Once | ORAL | Status: AC
  Administered 2014-04-24: 2 via ORAL
  Filled 2014-04-24: qty 2

## 2014-04-24 NOTE — Discharge Instructions (Signed)
Follow up with Dr. Fredna Dow for further evaluation of your fracture. Rest, ice and elevate your injury. Refer to attached documents for more information.

## 2014-04-24 NOTE — ED Provider Notes (Signed)
Medical screening examination/treatment/procedure(s) were performed by non-physician practitioner and as supervising physician I was immediately available for consultation/collaboration.  Leota Jacobsen, MD 04/24/14 [phone]

## 2014-05-22 ENCOUNTER — Emergency Department (HOSPITAL_COMMUNITY)
Admission: EM | Admit: 2014-05-22 | Discharge: 2014-05-22 | Payer: Medicare Other | Attending: Emergency Medicine | Admitting: Emergency Medicine

## 2014-05-22 ENCOUNTER — Encounter (HOSPITAL_COMMUNITY): Payer: Self-pay | Admitting: Emergency Medicine

## 2014-05-22 DIAGNOSIS — F17200 Nicotine dependence, unspecified, uncomplicated: Secondary | ICD-10-CM | POA: Insufficient documentation

## 2014-05-22 DIAGNOSIS — F209 Schizophrenia, unspecified: Secondary | ICD-10-CM | POA: Insufficient documentation

## 2014-05-22 DIAGNOSIS — S62309A Unspecified fracture of unspecified metacarpal bone, initial encounter for closed fracture: Secondary | ICD-10-CM

## 2014-05-22 DIAGNOSIS — I10 Essential (primary) hypertension: Secondary | ICD-10-CM | POA: Insufficient documentation

## 2014-05-22 DIAGNOSIS — Z79899 Other long term (current) drug therapy: Secondary | ICD-10-CM | POA: Insufficient documentation

## 2014-05-22 DIAGNOSIS — Z9889 Other specified postprocedural states: Secondary | ICD-10-CM | POA: Insufficient documentation

## 2014-05-22 DIAGNOSIS — G8929 Other chronic pain: Secondary | ICD-10-CM | POA: Insufficient documentation

## 2014-05-22 DIAGNOSIS — F319 Bipolar disorder, unspecified: Secondary | ICD-10-CM | POA: Insufficient documentation

## 2014-05-22 DIAGNOSIS — M25549 Pain in joints of unspecified hand: Secondary | ICD-10-CM | POA: Insufficient documentation

## 2014-05-22 DIAGNOSIS — Z862 Personal history of diseases of the blood and blood-forming organs and certain disorders involving the immune mechanism: Secondary | ICD-10-CM | POA: Insufficient documentation

## 2014-05-22 DIAGNOSIS — M129 Arthropathy, unspecified: Secondary | ICD-10-CM | POA: Insufficient documentation

## 2014-05-22 DIAGNOSIS — Z8639 Personal history of other endocrine, nutritional and metabolic disease: Secondary | ICD-10-CM | POA: Insufficient documentation

## 2014-05-22 DIAGNOSIS — G8911 Acute pain due to trauma: Secondary | ICD-10-CM | POA: Insufficient documentation

## 2014-05-22 MED ORDER — ACETAMINOPHEN 325 MG PO TABS
650.0000 mg | ORAL_TABLET | Freq: Once | ORAL | Status: AC
  Administered 2014-05-22: 650 mg via ORAL
  Filled 2014-05-22: qty 2
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
## 2014-05-22 NOTE — Discharge Instructions (Signed)
Cast or Splint Care Casts and splints support injured limbs and keep bones from moving while they heal. It is important to care for your cast or splint at home.  HOME CARE INSTRUCTIONS  Keep the cast or splint uncovered during the drying period. It can take 24 to 48 hours to dry if it is made of plaster. A fiberglass cast will dry in less than 1 hour.  Do not rest the cast on anything harder than a pillow for the first 24 hours.  Do not put weight on your injured limb or apply pressure to the cast until your health care provider gives you permission.  Keep the cast or splint dry. Wet casts or splints can lose their shape and may not support the limb as well. A wet cast that has lost its shape can also create harmful pressure on your skin when it dries. Also, wet skin can become infected.  Cover the cast or splint with a plastic bag when bathing or when out in the rain or snow. If the cast is on the trunk of the body, take sponge baths until the cast is removed.  If your cast does become wet, dry it with a towel or a blow dryer on the cool setting only.  Keep your cast or splint clean. Soiled casts may be wiped with a moistened cloth.  Do not place any hard or soft foreign objects under your cast or splint, such as cotton, toilet paper, lotion, or powder.  Do not try to scratch the skin under the cast with any object. The object could get stuck inside the cast. Also, scratching could lead to an infection. If itching is a problem, use a blow dryer on a cool setting to relieve discomfort.  Do not trim or cut your cast or remove padding from inside of it.  Exercise all joints next to the injury that are not immobilized by the cast or splint. For example, if you have a long leg cast, exercise the hip joint and toes. If you have an arm cast or splint, exercise the shoulder, elbow, thumb, and fingers.  Elevate your injured arm or leg on 1 or 2 pillows for the first 1 to 3 days to decrease  swelling and pain.It is best if you can comfortably elevate your cast so it is higher than your heart. SEEK MEDICAL CARE IF:   Your cast or splint cracks.  Your cast or splint is too tight or too loose.  You have unbearable itching inside the cast.  Your cast becomes wet or develops a soft spot or area.  You have a bad smell coming from inside your cast.  You get an object stuck under your cast.  Your skin around the cast becomes red or raw.  You have new pain or worsening pain after the cast has been applied. SEEK IMMEDIATE MEDICAL CARE IF:   You have fluid leaking through the cast.  You are unable to move your fingers or toes.  You have discolored (blue or white), cool, painful, or very swollen fingers or toes beyond the cast.  You have tingling or numbness around the injured area.  You have severe pain or pressure under the cast.  You have any difficulty with your breathing or have shortness of breath.  You have chest pain. Document Released: 12/08/2000 Document Revised: 10/01/2013 Document Reviewed: 06/19/2013 [facility] Patient Information 2014 [place].   PATIENT WILL NEED TO HAVE SURGERY TO REPAIR THE FRACTURE THAT IS NOT  HEALING
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
## 2014-05-24 NOTE — ED Provider Notes (Signed)
Medical screening examination/treatment/procedure(s) were performed by non-physician practitioner and as supervising physician I was immediately available for consultation/collaboration.     Mirna Mires, MD 05/24/14 [phone]

## 2014-06-22 ENCOUNTER — Telehealth (HOSPITAL_BASED_OUTPATIENT_CLINIC_OR_DEPARTMENT_OTHER): Payer: Self-pay

## 2014-06-22 ENCOUNTER — Encounter (HOSPITAL_COMMUNITY): Payer: Self-pay | Admitting: Emergency Medicine

## 2014-06-22 ENCOUNTER — Emergency Department (HOSPITAL_COMMUNITY)
Admission: EM | Admit: 2014-06-22 | Discharge: 2014-06-22 | Disposition: A | Discharge status: Home / Self Care | Payer: Medicare Other | Attending: Emergency Medicine | Admitting: Emergency Medicine

## 2014-06-22 DIAGNOSIS — F209 Schizophrenia, unspecified: Secondary | ICD-10-CM | POA: Diagnosis not present

## 2014-06-22 DIAGNOSIS — Z87828 Personal history of other (healed) physical injury and trauma: Secondary | ICD-10-CM | POA: Diagnosis not present

## 2014-06-22 DIAGNOSIS — F3289 Other specified depressive episodes: Secondary | ICD-10-CM | POA: Insufficient documentation

## 2014-06-22 DIAGNOSIS — F411 Generalized anxiety disorder: Secondary | ICD-10-CM | POA: Insufficient documentation

## 2014-06-22 DIAGNOSIS — Z791 Long term (current) use of non-steroidal anti-inflammatories (NSAID): Secondary | ICD-10-CM | POA: Insufficient documentation

## 2014-06-22 DIAGNOSIS — G8929 Other chronic pain: Secondary | ICD-10-CM | POA: Insufficient documentation

## 2014-06-22 DIAGNOSIS — I10 Essential (primary) hypertension: Secondary | ICD-10-CM | POA: Diagnosis not present

## 2014-06-22 DIAGNOSIS — M79609 Pain in unspecified limb: Secondary | ICD-10-CM | POA: Insufficient documentation

## 2014-06-22 DIAGNOSIS — G8918 Other acute postprocedural pain: Secondary | ICD-10-CM | POA: Insufficient documentation

## 2014-06-22 DIAGNOSIS — Z79899 Other long term (current) drug therapy: Secondary | ICD-10-CM | POA: Diagnosis not present

## 2014-06-22 DIAGNOSIS — Z8639 Personal history of other endocrine, nutritional and metabolic disease: Secondary | ICD-10-CM | POA: Diagnosis not present

## 2014-06-22 DIAGNOSIS — M79642 Pain in left hand: Secondary | ICD-10-CM

## 2014-06-22 DIAGNOSIS — M129 Arthropathy, unspecified: Secondary | ICD-10-CM | POA: Diagnosis not present

## 2014-06-22 DIAGNOSIS — F329 Major depressive disorder, single episode, unspecified: Secondary | ICD-10-CM | POA: Diagnosis not present

## 2014-06-22 DIAGNOSIS — F17200 Nicotine dependence, unspecified, uncomplicated: Secondary | ICD-10-CM | POA: Diagnosis not present

## 2014-06-22 DIAGNOSIS — Z862 Personal history of diseases of the blood and blood-forming organs and certain disorders involving the immune mechanism: Secondary | ICD-10-CM | POA: Insufficient documentation

## 2014-06-22 MED ORDER — FENTANYL CITRATE 0.05 MG/ML IJ SOLN
50.0000 ug | Freq: Once | INTRAMUSCULAR | Status: AC
  Administered 2014-06-22: 50 ug via INTRAMUSCULAR
  Filled 2014-06-22: qty 2

## 2014-06-22 MED ORDER — OXYCODONE-ACETAMINOPHEN 10-325 MG PO TABS: 1.0000 | ORAL_TABLET | ORAL | Status: DC | PRN

## 2014-06-22 NOTE — Telephone Encounter (Signed)
Pharmacy calling regarding percocet Rx.  Call transferred to prescriber for clarification
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
## 2014-06-22 NOTE — Discharge Instructions (Signed)
Please followup with your orthopedic hand specialist in 2 days as planned. Return at any time for changing or worsening symptoms.   Oxycodone tablets or capsules What is this medicine? OXYCODONE (ox i KOE done) is a pain reliever. It is used to treat moderate to severe pain. This medicine may be used for other purposes; ask your health care provider or pharmacist if you have questions. COMMON BRAND NAME(S): Dazidox, Endocodone, OXECTA, OxyIR, Percolone, Roxicodone What should I tell my health care provider before I take this medicine? They need to know if you have any of these conditions: -Addison's disease -brain tumor -drug abuse or addiction -head injury -heart disease -if you frequently drink alcohol containing drinks -kidney disease or problems going to the bathroom -liver disease -lung disease, asthma, or breathing problems -mental problems -an unusual or allergic reaction to oxycodone, codeine, hydrocodone, morphine, other medicines, foods, dyes, or preservatives -pregnant or trying to get pregnant -breast-feeding How should I use this medicine? Take this medicine by mouth with a glass of water. Follow the directions on the prescription label. You can take it with or without food. If it upsets your stomach, take it with food. Take your medicine at regular intervals. Do not take it more often than directed. Do not stop taking except on your doctor's advice. Some brands of this medicine, like Oxecta, have special instructions. Ask your doctor or pharmacist if these directions are for you: Do not cut, crush or chew this medicine. Swallow only one tablet at a time. Do not wet, soak, or lick the tablet before you take it. Talk to your pediatrician regarding the use of this medicine in children. Special care may be needed. Overdosage: If you think you have taken too much of this medicine contact a poison control center or emergency room at once. NOTE: This medicine is only for you. Do  not share this medicine with others. What if I miss a dose? If you miss a dose, take it as soon as you can. If it is almost time for your next dose, take only that dose. Do not take double or extra doses. What may interact with this medicine? -alcohol -antihistamines -certain medicines used for nausea like chlorpromazine, droperidol -erythromycin -ketoconazole -medicines for depression, anxiety, or psychotic disturbances -medicines for sleep -muscle relaxants -naloxone -naltrexone -narcotic medicines (opiates) for pain -nilotinib -phenobarbital -phenytoin -rifampin -ritonavir -voriconazole This list may not describe all possible interactions. Give your health care provider a list of all the medicines, herbs, non-prescription drugs, or dietary supplements you use. Also tell them if you smoke, drink alcohol, or use illegal drugs. Some items may interact with your medicine. What should I watch for while using this medicine? Tell your doctor or health care professional if your pain does not go away, if it gets worse, or if you have new or a different type of pain. You may develop tolerance to the medicine. Tolerance means that you will need a higher dose of the medicine for pain relief. Tolerance is normal and is expected if you take this medicine for a long time. Do not suddenly stop taking your medicine because you may develop a severe reaction. Your body becomes used to the medicine. This does NOT mean you are addicted. Addiction is a behavior related to getting and using a drug for a non-medical reason. If you have pain, you have a medical reason to take pain medicine. Your doctor will tell you how much medicine to take. If your doctor wants you to  stop the medicine, the dose will be slowly lowered over time to avoid any side effects. You may get drowsy or dizzy when you first start taking this medicine or change doses. Do not drive, use machinery, or do anything that may be dangerous until  you know how the medicine affects you. Stand or sit up slowly. There are different types of narcotic medicines (opiates) for pain. If you take more than one type at the same time, you may have more side effects. Give your health care provider a list of all medicines you use. Your doctor will tell you how much medicine to take. Do not take more medicine than directed. Call emergency for help if you have problems breathing. This medicine will cause constipation. Try to have a bowel movement at least every 2 to 3 days. If you do not have a bowel movement for 3 days, call your doctor or health care professional. Your mouth may get dry. Drinking water, chewing sugarless gum, or sucking on hard candy may help. See your dentist every 6 months. What side effects may I notice from receiving this medicine? Side effects that you should report to your doctor or health care professional as soon as possible: -allergic reactions like skin rash, itching or hives, swelling of the face, lips, or tongue -breathing problems -confusion -feeling faint or lightheaded, falls -trouble passing urine or change in the amount of urine -unusually weak or tired Side effects that usually do not require medical attention (report to your doctor or health care professional if they continue or are bothersome): -constipation -dry mouth -itching -nausea, vomiting -upset stomach This list may not describe all possible side effects. Call your doctor for medical advice about side effects. You may report side effects to FDA at 1-800-FDA-1088. Where should I keep my medicine? Keep out of the reach of children. This medicine can be abused. Keep your medicine in a safe place to protect it from theft. Do not share this medicine with anyone. Selling or giving away this medicine is dangerous and against the law. Store at room temperature between 15 and 30 degrees C (59 and 86 degrees F). Protect from light. Keep container tightly  closed. This medicine may cause accidental overdose and death if it is taken by other adults, children, or pets. Flush any unused medicine down the toilet to reduce the chance of harm. Do not use the medicine after the expiration date. NOTE: This sheet is a summary. It may not cover all possible information. If you have questions about this medicine, talk to your doctor, pharmacist, or health care provider.  2015, [employer]. (2013-08-21 13:43:33)

## 2014-06-23 NOTE — ED Provider Notes (Signed)
Medical screening examination/treatment/procedure(s) were performed by non-physician practitioner and as supervising physician I was immediately available for consultation/collaboration.   Neta Ehlers, MD 06/23/14 2352

## 2014-08-22 ENCOUNTER — Encounter (HOSPITAL_COMMUNITY): Payer: Self-pay | Admitting: Emergency Medicine

## 2014-08-22 ENCOUNTER — Emergency Department (HOSPITAL_COMMUNITY)
Admission: EM | Admit: 2014-08-22 | Discharge: 2014-08-22 | Disposition: A | Discharge status: Home / Self Care | Payer: Medicare Other | Attending: Emergency Medicine | Admitting: Emergency Medicine

## 2014-08-22 DIAGNOSIS — M19079 Primary osteoarthritis, unspecified ankle and foot: Secondary | ICD-10-CM | POA: Diagnosis present

## 2014-08-22 DIAGNOSIS — R259 Unspecified abnormal involuntary movements: Secondary | ICD-10-CM | POA: Insufficient documentation

## 2014-08-22 DIAGNOSIS — F411 Generalized anxiety disorder: Secondary | ICD-10-CM | POA: Insufficient documentation

## 2014-08-22 DIAGNOSIS — Z79899 Other long term (current) drug therapy: Secondary | ICD-10-CM | POA: Insufficient documentation

## 2014-08-22 DIAGNOSIS — F319 Bipolar disorder, unspecified: Secondary | ICD-10-CM | POA: Insufficient documentation

## 2014-08-22 DIAGNOSIS — I10 Essential (primary) hypertension: Secondary | ICD-10-CM | POA: Diagnosis not present

## 2014-08-22 DIAGNOSIS — F209 Schizophrenia, unspecified: Secondary | ICD-10-CM | POA: Insufficient documentation

## 2014-08-22 DIAGNOSIS — R61 Generalized hyperhidrosis: Secondary | ICD-10-CM | POA: Insufficient documentation

## 2014-08-22 DIAGNOSIS — F17200 Nicotine dependence, unspecified, uncomplicated: Secondary | ICD-10-CM | POA: Diagnosis not present

## 2014-08-22 DIAGNOSIS — Z87828 Personal history of other (healed) physical injury and trauma: Secondary | ICD-10-CM | POA: Insufficient documentation

## 2014-08-22 DIAGNOSIS — G8929 Other chronic pain: Secondary | ICD-10-CM | POA: Diagnosis not present

## 2014-08-22 DIAGNOSIS — M109 Gout, unspecified: Secondary | ICD-10-CM | POA: Insufficient documentation

## 2014-08-22 MED ORDER — INDOMETHACIN 25 MG PO CAPS
50.0000 mg | ORAL_CAPSULE | Freq: Once | ORAL | Status: AC
  Administered 2014-08-22: 50 mg via ORAL
  Filled 2014-08-22: qty 2

## 2014-08-22 MED ORDER — LORAZEPAM 0.5 MG PO TABS
0.5000 mg | ORAL_TABLET | Freq: Once | ORAL | Status: AC
  Administered 2014-08-22: 0.5 mg via ORAL
  Filled 2014-08-22: qty 1

## 2014-08-22 MED ORDER — INDOMETHACIN 25 MG PO CAPS: 25.0000 mg | ORAL_CAPSULE | Freq: Three times a day (TID) | ORAL | Status: DC | PRN
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
## 2014-08-22 NOTE — Discharge Instructions (Signed)
Gout Gout is an inflammatory arthritis caused by a buildup of uric acid crystals in the joints. Uric acid is a chemical that is normally present in the blood. When the level of uric acid in the blood is too high it can form crystals that deposit in your joints and tissues. This causes joint redness, soreness, and swelling (inflammation). Repeat attacks are common. Over time, uric acid crystals can form into masses (tophi) near a joint, destroying bone and causing disfigurement. Gout is treatable and often preventable. CAUSES  The disease begins with elevated levels of uric acid in the blood. Uric acid is produced by your body when it breaks down a naturally found substance called purines. Certain foods you eat, such as meats and fish, contain high amounts of purines. Causes of an elevated uric acid level include:  Being passed down from parent to child (heredity).  Diseases that cause increased uric acid production (such as obesity, psoriasis, and certain cancers).  Excessive alcohol use.  Diet, especially diets rich in meat and seafood.  Medicines, including certain cancer-fighting medicines (chemotherapy), water pills (diuretics), and aspirin.  Chronic kidney disease. The kidneys are no longer able to remove uric acid well.  Problems with metabolism. Conditions strongly associated with gout include:  Obesity.  High blood pressure.  High cholesterol.  Diabetes. Not everyone with elevated uric acid levels gets gout. It is not understood why some people get gout and others do not. Surgery, joint injury, and eating too much of certain foods are some of the factors that can lead to gout attacks. SYMPTOMS   An attack of gout comes on quickly. It causes intense pain with redness, swelling, and warmth in a joint.  Fever can occur.  Often, only one joint is involved. Certain joints are more commonly involved:  Base of the big toe.  Knee.  Ankle.  Wrist.  Finger. Without  treatment, an attack usually goes away in a few days to weeks. Between attacks, you usually will not have symptoms, which is different from many other forms of arthritis. DIAGNOSIS  Your caregiver will suspect gout based on your symptoms and exam. In some cases, tests may be recommended. The tests may include:  Blood tests.  Urine tests.  X-rays.  Joint fluid exam. This exam requires a needle to remove fluid from the joint (arthrocentesis). Using a microscope, gout is confirmed when uric acid crystals are seen in the joint fluid. TREATMENT  There are two phases to gout treatment: treating the sudden onset (acute) attack and preventing attacks (prophylaxis).  Treatment of an Acute Attack.  Medicines are used. These include anti-inflammatory medicines or steroid medicines.  An injection of steroid medicine into the affected joint is sometimes necessary.  The painful joint is rested. Movement can worsen the arthritis.  You may use warm or cold treatments on painful joints, depending which works best for you.  Treatment to Prevent Attacks.  If you suffer from frequent gout attacks, your caregiver may advise preventive medicine. These medicines are started after the acute attack subsides. These medicines either help your kidneys eliminate uric acid from your body or decrease your uric acid production. You may need to stay on these medicines for a very long time.  The early phase of treatment with preventive medicine can be associated with an increase in acute gout attacks. For this reason, during the first few months of treatment, your caregiver may also advise you to take medicines usually used for acute gout treatment. Be sure you  understand your caregiver's directions. Your caregiver may make several adjustments to your medicine dose before these medicines are effective.  Discuss dietary treatment with your caregiver or dietitian. Alcohol and drinks high in sugar and fructose and foods  such as meat, poultry, and seafood can increase uric acid levels. Your caregiver or dietitian can advise you on drinks and foods that should be limited. HOME CARE INSTRUCTIONS   Do not take aspirin to relieve pain. This raises uric acid levels.  Only take over-the-counter or prescription medicines for pain, discomfort, or fever as directed by your caregiver.  Rest the joint as much as possible. When in bed, keep sheets and blankets off painful areas.  Keep the affected joint raised (elevated).  Apply warm or cold treatments to painful joints. Use of warm or cold treatments depends on which works best for you.  Use crutches if the painful joint is in your leg.  Drink enough fluids to keep your urine clear or pale yellow. This helps your body get rid of uric acid. Limit alcohol, sugary drinks, and fructose drinks.  Follow your dietary instructions. Pay careful attention to the amount of protein you eat. Your daily diet should emphasize fruits, vegetables, whole grains, and fat-free or low-fat milk products. Discuss the use of coffee, vitamin C, and cherries with your caregiver or dietitian. These may be helpful in lowering uric acid levels.  Maintain a healthy body weight. SEEK MEDICAL CARE IF:   You develop diarrhea, vomiting, or any side effects from medicines.  You do not feel better in 24 hours, or you are getting worse. SEEK IMMEDIATE MEDICAL CARE IF:   Your joint becomes suddenly more tender, and you have chills or a fever. MAKE SURE YOU:   Understand these instructions.  Will watch your condition.  Will get help right away if you are not doing well or get worse. Document Released: 12/08/2000 Document Revised: 04/27/2014 Document Reviewed: 07/24/2012 [facility] Patient Information 2015 [place], [place]. This information is not intended to replace advice given to you by your health care provider. Make sure you discuss any questions you have with your health care  provider.  Opioid Withdrawal Opioids are a group of narcotic drugs. They also include prescribed pain medicines, such as morphine, hydrocodone, oxycodone, and fentanyl. Opioid withdrawal is a group of characteristic physical and mental signs and symptoms. It typically occurs if you have been using opioids daily for several weeks or longer and stop using or rapidly decrease use. Opioid withdrawal can also occur if you have used opioids daily for a long time and are given a medicine to block the effect.  SIGNS AND SYMPTOMS Opioid withdrawal includes three or more of the following symptoms:   Depressed, anxious, or irritable mood.  Nausea or vomiting.  Muscle aches or spasms.   Watery eyes.   Runny nose.  Dilated pupils, sweating, or hairs standing on end.  Diarrhea or intestinal cramping.  Yawning.   Fever.  Increased blood pressure.  Fast pulse.  Restlessness or trouble sleeping. These signs and symptoms occur within several hours of stopping or reducing short-acting opioids, such as heroin. They can occur within 3 days of stopping or reducing long-acting opioids, such as methadone. Withdrawal begins within minutes of receiving a drug that blocks the effects of opioids, such as naltrexone or naloxone. DIAGNOSIS  Opioid use disorder is diagnosed by your health care provider. You will be asked about your symptoms, drug and alcohol use, medical history, and use of medicines. A physical  exam may be done. Lab tests may be ordered. Your health care provider may have you see a mental health professional.  TREATMENT  The treatment for opioid withdrawal is usually provided by medical doctors with special training in substance use disorders (addiction specialists). The following medicines may be included in treatment:  Opioids given in place of the abused opioid. They turn on opioid receptors in the brain and lessen or prevent withdrawal symptoms. They are gradually decreased (opioid  substitution and taper).  Non-opioids that can lessen certain opioid withdrawal symptoms. They may be used alone or with opioid substitution and taper. Successful long-term recovery usually requires medicine, counseling, and group support. HOME CARE INSTRUCTIONS   Take medicines only as directed by your health care provider.  Check with your health care provider before starting new medicines.  Keep all follow-up visits as directed by your health care provider. SEEK MEDICAL CARE IF:  You are not able to take your medicines as directed.  Your symptoms get worse.  You relapse. SEEK IMMEDIATE MEDICAL CARE IF:  You have serious thoughts about hurting yourself or others.  You have a seizure.  You lose consciousness. Document Released: 12/14/2003 Document Revised: 04/27/2014 Document Reviewed: 12/24/2013 [facility] Patient Information 2015 [place], [place]. This information is not intended to replace advice given to you by your health care provider. Make sure you discuss any questions you have with your health care provider.

## 2014-08-22 NOTE — ED Notes (Signed)
PT ambulated with baseline gait; VSS; A&Ox3; no signs of distress; respirations even and unlabored; skin warm and dry; no questions upon discharge.  
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
## 2014-08-23 NOTE — ED Provider Notes (Signed)
Medical screening examination/treatment/procedure(s) were performed by non-physician practitioner and as supervising physician I was immediately available for consultation/collaboration.   EKG Interpretation None        Evelina Bucy, MD 08/23/14 0002

## 2014-09-17 ENCOUNTER — Encounter (HOSPITAL_COMMUNITY): Payer: Self-pay | Admitting: Emergency Medicine

## 2014-09-17 ENCOUNTER — Emergency Department (HOSPITAL_COMMUNITY)
Admission: EM | Admit: 2014-09-17 | Discharge: 2014-09-17 | Disposition: A | Discharge status: Home / Self Care | Payer: Medicare Other | Attending: Emergency Medicine | Admitting: Emergency Medicine

## 2014-09-17 DIAGNOSIS — K029 Dental caries, unspecified: Secondary | ICD-10-CM | POA: Diagnosis not present

## 2014-09-17 DIAGNOSIS — Z8659 Personal history of other mental and behavioral disorders: Secondary | ICD-10-CM | POA: Diagnosis not present

## 2014-09-17 DIAGNOSIS — M129 Arthropathy, unspecified: Secondary | ICD-10-CM | POA: Insufficient documentation

## 2014-09-17 DIAGNOSIS — Z79899 Other long term (current) drug therapy: Secondary | ICD-10-CM | POA: Insufficient documentation

## 2014-09-17 DIAGNOSIS — G8929 Other chronic pain: Secondary | ICD-10-CM | POA: Diagnosis not present

## 2014-09-17 DIAGNOSIS — F17200 Nicotine dependence, unspecified, uncomplicated: Secondary | ICD-10-CM | POA: Diagnosis not present

## 2014-09-17 DIAGNOSIS — I10 Essential (primary) hypertension: Secondary | ICD-10-CM | POA: Insufficient documentation

## 2014-09-17 DIAGNOSIS — F319 Bipolar disorder, unspecified: Secondary | ICD-10-CM | POA: Diagnosis not present

## 2014-09-17 DIAGNOSIS — F411 Generalized anxiety disorder: Secondary | ICD-10-CM | POA: Insufficient documentation

## 2014-09-17 DIAGNOSIS — Z791 Long term (current) use of non-steroidal anti-inflammatories (NSAID): Secondary | ICD-10-CM | POA: Insufficient documentation

## 2014-09-17 DIAGNOSIS — K089 Disorder of teeth and supporting structures, unspecified: Secondary | ICD-10-CM | POA: Insufficient documentation

## 2014-09-17 DIAGNOSIS — F3289 Other specified depressive episodes: Secondary | ICD-10-CM | POA: Insufficient documentation

## 2014-09-17 DIAGNOSIS — F329 Major depressive disorder, single episode, unspecified: Secondary | ICD-10-CM | POA: Diagnosis not present

## 2014-09-17 MED ORDER — OXYCODONE-ACETAMINOPHEN 5-325 MG PO TABS
1.0000 | ORAL_TABLET | Freq: Once | ORAL | Status: AC
  Administered 2014-09-17: 1 via ORAL
  Filled 2014-09-17: qty 1

## 2014-09-17 MED ORDER — KETOROLAC TROMETHAMINE 60 MG/2ML IM SOLN
60.0000 mg | Freq: Once | INTRAMUSCULAR | Status: AC
  Administered 2014-09-17: 60 mg via INTRAMUSCULAR
  Filled 2014-09-17: qty 2
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
## 2014-09-17 NOTE — Discharge Instructions (Signed)
Dental Pain Toothache is pain in or around a tooth. It may get worse with chewing or with cold or heat.  HOME CARE  Your dentist may use a numbing medicine during treatment. If so, you may need to avoid eating until the medicine wears off. Ask your dentist about this.  Only take medicine as told by your dentist or doctor.  Avoid chewing food near the painful tooth until after all treatment is done. Ask your dentist about this. GET HELP RIGHT AWAY IF:   The problem gets worse or new problems appear.  You have a fever.  There is redness and puffiness (swelling) of the face, jaw, or neck.  You cannot open your mouth.  There is pain in the jaw.  There is very bad pain that is not helped by medicine. MAKE SURE YOU:   Understand these instructions.  Will watch your condition.  Will get help right away if you are not doing well or get worse. Document Released: 05/29/2008 Document Revised: 03/04/2012 Document Reviewed: 05/29/2008 [facility] Patient Information 2015 [place], [place]. This information is not intended to replace advice given to you by your health care provider. Make sure you discuss any questions you have with your health care provider.  Dental Extraction A dental extraction procedure refers to a routine tooth extraction performed by your dentist. The procedure depends on where and how the tooth is positioned. The procedure can be very quick, sometimes lasting only seconds. Reasons for dental extraction include:  Tooth decay.  Infections (abscesses).  The need to make room for other teeth.  Gum diseases where the supporting bone has been destroyed.  Fractures of the tooth leaving it unrestorable.  Extra teeth (supernumerary) or grossly malformed teeth.  Baby teeththat have not fallen out in time and have not permitted the the permanent teeth to erupt properly.  In preparation for braces where there is not enough room to align the teeth properly.  Not enough room  for wisdom teeth (particularly those that are impacted).  Prior to receiving radiation to the head and neck,teeth in the field of radiation may need to be extracted. LET YOUR CAREGIVER KNOW ABOUT:  Any allergies.  All medicines you are taking:  Including herbs, eye drops, over-the-counter medications, and creams.  Blood thinners (anticoagulants), aspirin, or other drugs that may affect blood clotting.  Use of steroids (through mouth or as creams).  Previous problems with anesthetics, including local anesthetics.  History of bleeding or blood problems.  Previous surgery.  Possibility of pregnancy if this applies.  Smoking history.  Any health problems. RISKS AND COMPLICATIONS As with any procedure, complications may occur, but they can usually be managed by your caregiver. General surgical complications may include:  Reaction to anesthesia.  Damage to surrounding teeth, nerves, tissues, or structures.  Infection.  Bleeding. With appropriate treatment and care after surgery, the following complications are very uncommon:  Dry socket (blood clot does not form or stay in place over empty socket). This can delay healing.  Incomplete extraction of roots.  Jawbone injury, pain, or weakness. BEFORE THE PROCEDURE  Your dental care provider will:  Take a medical and dental history.  Take an X-ray to evaluate the circumstances and how to best extract the tooth.  Do an oral exam.  Depending on the situation, antibiotics may be given before or after the extraction.  Your caregivers may review the procedure, the local anesthesia and/or sedation being used, and what to expect after the procedure with you.  If needed, your dentist may give you a form of sedation, either by medicine you swallow, gas, or intravenously (IV). This will help to relieve anxiety. Complicated extractions may require the use of general anesthesia. It is important to follow your caregiver's  instructions prior to your procedure to avoid complications. Steps before your procedure may include:  Alert your caregiver if you feel ill (sore throat, fever, upset stomach, etc.) in the days leading up to your procedure.  Stop taking certain medications for several days prior to your procedure such as blood thinners.  Take certain medications, such as antibiotics.  Avoid eating and drinking for several hours before the procedure. This will help you to avoid complications from the sedation or anesthesia.  Sign a patient consent form.  Have a friend or family member drive you to the dentist and drive you home after the procedure.  Wear comfortable, loose clothing. Limit makeup and jewelry.  Quit smoking. If you are a smoker, this will raise the chances of a healing problem after your procedure. If you are thinking about quitting, talk to your surgeon about how long before the operation you should stop smoking. You may also get help from your primary caregiver. PROCEDURE Dental extraction is typically done as an outpatient procedure. IV sedation, local anesthesia, or both may be used. It will keep you comfortable and free of pain during the procedure.  There are 2 types of extractions:  Simple extraction involves a tooth that is visible in the mouth and above the gum line. After local anesthetic is given by injection, and the area is numbed, the dentist will loosen the tooth with a special instrument (elevator). Then another instrument (forceps) will be used to grasp the tooth and remove it from its socket. During the procedure you will feel some pressure, but you should not feel pain. If you do feel pain, tell your dentist. The open socket will be cleaned. Dressings (gauze) will be placed in the socket to reduce bleeding.  Surgical extractions are used if the tooth has not come into the mouth or the tooth is broken off below the gum line. The dentist will make a cut (incision) in the gum and  may have to remove some of the bone around the tooth to aid in the removal of the tooth. After removal, stitches (sutures) may be required to close the area to help in healing and control bleeding. For some surgical extractions, you may need a general anesthetic or IV sedation (through the vein). After both types of extractions, you may be given pain medication or other drugs to help healing. Other postoperative instructions will be given by your dental caregiver. AFTER THE PROCEDURE  You will have gauze in your mouth where the tooth was removed. Gentle pressure on the gauze for up to 1 hour will help to control bleeding.  A blood clot will begin to form over the open socket. This is normal. Do not touch the area or rinse it.  Your pain will be controlled with medication and self-care.  You will be given detailed instructions for care after surgery. PROGNOSIS While some discomfort is normal after tooth extraction, most patients recover fully in just a few days. SEEK IMMEDIATE DENTAL CARE  You have uncontrolled bleeding, marked swelling, or severe pain.  You develop a fever, difficulty swallowing, or other severe symptoms.  You have questions or concerns. Document Released: 12/11/2005 Document Revised: 04/27/2014 Document Reviewed: 03/17/2011 [facility] Patient Information 2015 [place], [place]. This information is  not intended to replace advice given to you by your health care provider. Make sure you discuss any questions you have with your health care provider.
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
## 2014-09-18 NOTE — ED Provider Notes (Signed)
Medical screening examination/treatment/procedure(s) were performed by non-physician practitioner and as supervising physician I was immediately available for consultation/collaboration.   EKG Interpretation None       Virgel Manifold, MD 09/18/14 1500

## 2014-09-22 ENCOUNTER — Encounter (HOSPITAL_COMMUNITY): Payer: Self-pay | Admitting: Emergency Medicine

## 2014-09-22 ENCOUNTER — Emergency Department (HOSPITAL_COMMUNITY)
Admission: EM | Admit: 2014-09-22 | Discharge: 2014-09-22 | Disposition: A | Discharge status: Home / Self Care | Payer: Medicare Other | Attending: Emergency Medicine | Admitting: Emergency Medicine

## 2014-09-22 DIAGNOSIS — K029 Dental caries, unspecified: Secondary | ICD-10-CM | POA: Diagnosis not present

## 2014-09-22 DIAGNOSIS — I10 Essential (primary) hypertension: Secondary | ICD-10-CM | POA: Diagnosis not present

## 2014-09-22 DIAGNOSIS — F411 Generalized anxiety disorder: Secondary | ICD-10-CM | POA: Insufficient documentation

## 2014-09-22 DIAGNOSIS — M109 Gout, unspecified: Secondary | ICD-10-CM | POA: Diagnosis not present

## 2014-09-22 DIAGNOSIS — G8929 Other chronic pain: Secondary | ICD-10-CM | POA: Insufficient documentation

## 2014-09-22 DIAGNOSIS — Z87828 Personal history of other (healed) physical injury and trauma: Secondary | ICD-10-CM | POA: Insufficient documentation

## 2014-09-22 DIAGNOSIS — Z791 Long term (current) use of non-steroidal anti-inflammatories (NSAID): Secondary | ICD-10-CM | POA: Insufficient documentation

## 2014-09-22 DIAGNOSIS — K089 Disorder of teeth and supporting structures, unspecified: Secondary | ICD-10-CM | POA: Insufficient documentation

## 2014-09-22 DIAGNOSIS — F17200 Nicotine dependence, unspecified, uncomplicated: Secondary | ICD-10-CM | POA: Diagnosis not present

## 2014-09-22 DIAGNOSIS — F209 Schizophrenia, unspecified: Secondary | ICD-10-CM | POA: Insufficient documentation

## 2014-09-22 DIAGNOSIS — Z8739 Personal history of other diseases of the musculoskeletal system and connective tissue: Secondary | ICD-10-CM | POA: Insufficient documentation

## 2014-09-22 DIAGNOSIS — F319 Bipolar disorder, unspecified: Secondary | ICD-10-CM | POA: Diagnosis not present

## 2014-09-22 DIAGNOSIS — Z79899 Other long term (current) drug therapy: Secondary | ICD-10-CM | POA: Insufficient documentation

## 2014-09-22 DIAGNOSIS — Z792 Long term (current) use of antibiotics: Secondary | ICD-10-CM | POA: Insufficient documentation

## 2014-09-22 DIAGNOSIS — K006 Disturbances in tooth eruption: Secondary | ICD-10-CM | POA: Diagnosis not present

## 2014-09-22 DIAGNOSIS — Z87820 Personal history of traumatic brain injury: Secondary | ICD-10-CM | POA: Insufficient documentation

## 2014-09-22 DIAGNOSIS — K0889 Other specified disorders of teeth and supporting structures: Secondary | ICD-10-CM

## 2014-09-22 MED ORDER — OXYCODONE-ACETAMINOPHEN 5-325 MG PO TABS
2.0000 | ORAL_TABLET | Freq: Once | ORAL | Status: AC
  Administered 2014-09-22: 2 via ORAL
  Filled 2014-09-22: qty 2

## 2014-09-22 MED ORDER — PENICILLIN V POTASSIUM 500 MG PO TABS: 500.0000 mg | ORAL_TABLET | Freq: Four times a day (QID) | ORAL | Status: AC

## 2014-09-22 NOTE — ED Notes (Signed)
Patient states he tried to remove his tooth with a needle nose pliar. Patient states the tooth broke below the gum line and the needlenose pliar slipped and cut his tongue.
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
## 2014-09-22 NOTE — Discharge Instructions (Signed)
Dental Care and Dentist Visits  Dental care supports good overall health. Regular dental visits can also help you avoid dental pain, bleeding, infection, and other more serious health problems in the future. It is important to keep the mouth healthy because diseases in the teeth, gums, and other oral tissues can spread to other areas of the body. Some problems, such as diabetes, heart disease, and pre-term labor have been associated with poor oral health.   See your dentist every 6 months. If you experience emergency problems such as a toothache or broken tooth, go to the dentist right away. If you see your dentist regularly, you may catch problems early. It is easier to be treated for problems in the early stages.   WHAT TO EXPECT AT A DENTIST VISIT   Your dentist will look for many common oral health problems and recommend proper treatment. At your regular dental visit, you can expect:  · Gentle cleaning of the teeth and gums. This includes scraping and polishing. This helps to remove the sticky substance around the teeth and gums (plaque). Plaque forms in the mouth shortly after eating. Over time, plaque hardens on the teeth as tartar. If tartar is not removed regularly, it can cause problems. Cleaning also helps remove stains.  · Periodic X-rays. These pictures of the teeth and supporting bone will help your dentist assess the health of your teeth.  · Periodic fluoride treatments. Fluoride is a natural mineral shown to help strengthen teeth. Fluoride treatment involves applying a fluoride gel or varnish to the teeth. It is most commonly done in children.  · Examination of the mouth, tongue, jaws, teeth, and gums to look for any oral health problems, such as:  ¨ Cavities (dental caries). This is decay on the tooth caused by plaque, sugar, and acid in the mouth. It is best to catch a cavity when it is small.  ¨ Inflammation of the gums caused by plaque buildup (gingivitis).  ¨ Problems with the mouth or malformed  or misaligned teeth.  ¨ Oral cancer or other diseases of the soft tissues or jaws.   KEEP YOUR TEETH AND GUMS HEALTHY  For healthy teeth and gums, follow these general guidelines as well as your dentist's specific advice:  · Have your teeth professionally cleaned at the dentist every 6 months.  · Brush twice daily with a fluoride toothpaste.  · Floss your teeth daily.   · Ask your dentist if you need fluoride supplements, treatments, or fluoride toothpaste.  · Eat a healthy diet. Reduce foods and drinks with added sugar.  · Avoid smoking.  TREATMENT FOR ORAL HEALTH PROBLEMS  If you have oral health problems, treatment varies depending on the conditions present in your teeth and gums.  · Your caregiver will most likely recommend good oral hygiene at each visit.  · For cavities, gingivitis, or other oral health disease, your caregiver will perform a procedure to treat the problem. This is typically done at a separate appointment. Sometimes your caregiver will refer you to another dental specialist for specific tooth problems or for surgery.  SEEK IMMEDIATE DENTAL CARE IF:  · You have pain, bleeding, or soreness in the gum, tooth, jaw, or mouth area.  · A permanent tooth becomes loose or separated from the gum socket.  · You experience a blow or injury to the mouth or jaw area.  Document Released: 08/23/2011 Document Revised: 03/04/2012 Document Reviewed: 08/23/2011  [employer]® Patient Information ©2015 [employer]. This information is not intended to replace advice   given to you by your health care provider. Make sure you discuss any questions you have with your health care provider.

## 2014-09-23 NOTE — ED Provider Notes (Signed)
Medical screening examination/treatment/procedure(s) were performed by non-physician practitioner and as supervising physician I was immediately available for consultation/collaboration.  Ernestina Patches, MD 09/23/14 [phone]

## 2014-10-29 ENCOUNTER — Encounter (HOSPITAL_COMMUNITY): Payer: Self-pay | Admitting: Emergency Medicine

## 2014-10-29 ENCOUNTER — Emergency Department (HOSPITAL_COMMUNITY): Payer: Medicare Other

## 2014-10-29 ENCOUNTER — Emergency Department (HOSPITAL_COMMUNITY)
Admission: EM | Admit: 2014-10-29 | Discharge: 2014-10-29 | Disposition: A | Discharge status: Home / Self Care | Payer: Medicare Other | Attending: Emergency Medicine | Admitting: Emergency Medicine

## 2014-10-29 DIAGNOSIS — M79602 Pain in left arm: Secondary | ICD-10-CM

## 2014-10-29 DIAGNOSIS — S4992XA Unspecified injury of left shoulder and upper arm, initial encounter: Secondary | ICD-10-CM | POA: Insufficient documentation

## 2014-10-29 DIAGNOSIS — G8929 Other chronic pain: Secondary | ICD-10-CM | POA: Insufficient documentation

## 2014-10-29 DIAGNOSIS — Z8659 Personal history of other mental and behavioral disorders: Secondary | ICD-10-CM | POA: Insufficient documentation

## 2014-10-29 DIAGNOSIS — Z791 Long term (current) use of non-steroidal anti-inflammatories (NSAID): Secondary | ICD-10-CM | POA: Insufficient documentation

## 2014-10-29 DIAGNOSIS — M069 Rheumatoid arthritis, unspecified: Secondary | ICD-10-CM | POA: Insufficient documentation

## 2014-10-29 DIAGNOSIS — Z720 Tobacco use: Secondary | ICD-10-CM | POA: Insufficient documentation

## 2014-10-29 DIAGNOSIS — I10 Essential (primary) hypertension: Secondary | ICD-10-CM | POA: Diagnosis not present

## 2014-10-29 DIAGNOSIS — S0990XA Unspecified injury of head, initial encounter: Secondary | ICD-10-CM

## 2014-10-29 MED ORDER — IBUPROFEN 600 MG PO TABS: 600.0000 mg | ORAL_TABLET | Freq: Four times a day (QID) | ORAL | Status: DC | PRN

## 2014-10-29 MED ORDER — ACETAMINOPHEN 500 MG PO TABS
1000.0000 mg | ORAL_TABLET | Freq: Once | ORAL | Status: AC
  Administered 2014-10-29: 1000 mg via ORAL
  Filled 2014-10-29: qty 2
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
## 2014-10-29 NOTE — ED Notes (Signed)
Patient transported to CT 

## 2014-10-29 NOTE — Discharge Instructions (Signed)
Follow-up with orthopedics for your elbow. Follow-up with neurosurgery regarding your neck.  Concussion A concussion, or closed-head injury, is a brain injury caused by a direct blow to the head or by a quick and sudden movement (jolt) of the head or neck. Concussions are usually not life-threatening. Even so, the effects of a concussion can be serious. If you have had a concussion before, you are more likely to experience concussion-like symptoms after a direct blow to the head.  CAUSES  Direct blow to the head, such as from running into another player during a soccer game, being hit in a fight, or hitting your head on a hard surface.  A jolt of the head or neck that causes the brain to move back and forth inside the skull, such as in a car crash. SIGNS AND SYMPTOMS The signs of a concussion can be hard to notice. Early on, they may be missed by you, family members, and health care providers. You may look fine but act or feel differently. Symptoms are usually temporary, but they may last for days, weeks, or even longer. Some symptoms may appear right away while others may not show up for hours or days. Every head injury is different. Symptoms include:  Mild to moderate headaches that will not go away.  A feeling of pressure inside your head.  Having more trouble than usual:  Learning or remembering things you have heard.  Answering questions.  Paying attention or concentrating.  Organizing daily tasks.  Making decisions and solving problems.  Slowness in thinking, acting or reacting, speaking, or reading.  Getting lost or being easily confused.  Feeling tired all the time or lacking energy (fatigued).  Feeling drowsy.  Sleep disturbances.  Sleeping more than usual.  Sleeping less than usual.  Trouble falling asleep.  Trouble sleeping (insomnia).  Loss of balance or feeling lightheaded or dizzy.  Nausea or vomiting.  Numbness or tingling.  Increased sensitivity  to:  Sounds.  Lights.  Distractions.  Vision problems or eyes that tire easily.  Diminished sense of taste or smell.  Ringing in the ears.  Mood changes such as feeling sad or anxious.  Becoming easily irritated or angry for little or no reason.  Lack of motivation.  Seeing or hearing things other people do not see or hear (hallucinations). DIAGNOSIS Your health care provider can usually diagnose a concussion based on a description of your injury and symptoms. He or she will ask whether you passed out (lost consciousness) and whether you are having trouble remembering events that happened right before and during your injury. Your evaluation might include:  A brain scan to look for signs of injury to the brain. Even if the test shows no injury, you may still have a concussion.  Blood tests to be sure other problems are not present. TREATMENT  Concussions are usually treated in an emergency department, in urgent care, or at a clinic. You may need to stay in the hospital overnight for further treatment.  Tell your health care provider if you are taking any medicines, including prescription medicines, over-the-counter medicines, and natural remedies. Some medicines, such as blood thinners (anticoagulants) and aspirin, may increase the chance of complications. Also tell your health care provider whether you have had alcohol or are taking illegal drugs. This information may affect treatment.  Your health care provider will send you home with important instructions to follow.  How fast you will recover from a concussion depends on many factors. These factors include  how severe your concussion is, what part of your brain was injured, your age, and how healthy you were before the concussion.  Most people with mild injuries recover fully. Recovery can take time. In general, recovery is slower in older persons. Also, persons who have had a concussion in the past or have other medical  problems may find that it takes longer to recover from their current injury. HOME CARE INSTRUCTIONS General Instructions  Carefully follow the directions your health care provider gave you.  Only take over-the-counter or prescription medicines for pain, discomfort, or fever as directed by your health care provider.  Take only those medicines that your health care provider has approved.  Do not drink alcohol until your health care provider says you are well enough to do so. Alcohol and certain other drugs may slow your recovery and can put you at risk of further injury.  If it is harder than usual to remember things, write them down.  If you are easily distracted, try to do one thing at a time. For example, do not try to watch TV while fixing dinner.  Talk with family members or close friends when making important decisions.  Keep all follow-up appointments. Repeated evaluation of your symptoms is recommended for your recovery.  Watch your symptoms and tell others to do the same. Complications sometimes occur after a concussion. Older adults with a brain injury may have a higher risk of serious complications, such as a blood clot on the brain.  Tell your teachers, school nurse, school counselor, coach, athletic trainer, or work Freight forwarder about your injury, symptoms, and restrictions. Tell them about what you can or cannot do. They should watch for:  Increased problems with attention or concentration.  Increased difficulty remembering or learning new information.  Increased time needed to complete tasks or assignments.  Increased irritability or decreased ability to cope with stress.  Increased symptoms.  Rest. Rest helps the brain to heal. Make sure you:  Get plenty of sleep at night. Avoid staying up late at night.  Keep the same bedtime hours on weekends and weekdays.  Rest during the day. Take daytime naps or rest breaks when you feel tired.  Limit activities that require a  lot of thought or concentration. These include:  Doing homework or job-related work.  Watching TV.  Working on the computer.  Avoid any situation where there is potential for another head injury (football, hockey, soccer, basketball, martial arts, downhill snow sports and horseback riding). Your condition will get worse every time you experience a concussion. You should avoid these activities until you are evaluated by the appropriate follow-up health care providers. Returning To Your Regular Activities You will need to return to your normal activities slowly, not all at once. You must give your body and brain enough time for recovery.  Do not return to sports or other athletic activities until your health care provider tells you it is safe to do so.  Ask your health care provider when you can drive, ride a bicycle, or operate heavy machinery. Your ability to react may be slower after a brain injury. Never do these activities if you are dizzy.  Ask your health care provider about when you can return to work or school. Preventing Another Concussion It is very important to avoid another brain injury, especially before you have recovered. In rare cases, another injury can lead to permanent brain damage, brain swelling, or death. The risk of this is greatest during the first  7-10 days after a head injury. Avoid injuries by:  Wearing a seat belt when riding in a car.  Drinking alcohol only in moderation.  Wearing a helmet when biking, skiing, skateboarding, skating, or doing similar activities.  Avoiding activities that could lead to a second concussion, such as contact or recreational sports, until your health care provider says it is okay.  Taking safety measures in your home.  Remove clutter and tripping hazards from floors and stairways.  Use grab bars in bathrooms and handrails by stairs.  Place non-slip mats on floors and in bathtubs.  Improve lighting in dim areas. SEEK MEDICAL  CARE IF:  You have increased problems paying attention or concentrating.  You have increased difficulty remembering or learning new information.  You need more time to complete tasks or assignments than before.  You have increased irritability or decreased ability to cope with stress.  You have more symptoms than before. Seek medical care if you have any of the following symptoms for more than 2 weeks after your injury:  Lasting (chronic) headaches.  Dizziness or balance problems.  Nausea.  Vision problems.  Increased sensitivity to noise or light.  Depression or mood swings.  Anxiety or irritability.  Memory problems.  Difficulty concentrating or paying attention.  Sleep problems.  Feeling tired all the time. SEEK IMMEDIATE MEDICAL CARE IF:  You have severe or worsening headaches. These may be a sign of a blood clot in the brain.  You have weakness (even if only in one hand, leg, or part of the face).  You have numbness.  You have decreased coordination.  You vomit repeatedly.  You have increased sleepiness.  One pupil is larger than the other.  You have convulsions.  You have slurred speech.  You have increased confusion. This may be a sign of a blood clot in the brain.  You have increased restlessness, agitation, or irritability.  You are unable to recognize people or places.  You have neck pain.  It is difficult to wake you up.  You have unusual behavior changes.  You lose consciousness. MAKE SURE YOU:  Understand these instructions.  Will watch your condition.  Will get help right away if you are not doing well or get worse. Document Released: 03/02/2004 Document Revised: 12/16/2013 Document Reviewed: 07/03/2013 [facility] Patient Information 2015 [place], [place]. This information is not intended to replace advice given to you by your health care provider. Make sure you discuss any questions you have with your health care provider.

## 2014-11-25 ENCOUNTER — Encounter (HOSPITAL_COMMUNITY): Payer: Self-pay | Admitting: Emergency Medicine

## 2014-11-25 ENCOUNTER — Emergency Department (HOSPITAL_COMMUNITY)
Admission: EM | Admit: 2014-11-25 | Discharge: 2014-11-25 | Disposition: A | Discharge status: Home / Self Care | Payer: Medicare Other | Attending: Emergency Medicine | Admitting: Emergency Medicine

## 2014-11-25 DIAGNOSIS — F329 Major depressive disorder, single episode, unspecified: Secondary | ICD-10-CM | POA: Diagnosis not present

## 2014-11-25 DIAGNOSIS — Z87828 Personal history of other (healed) physical injury and trauma: Secondary | ICD-10-CM | POA: Diagnosis not present

## 2014-11-25 DIAGNOSIS — F419 Anxiety disorder, unspecified: Secondary | ICD-10-CM | POA: Diagnosis not present

## 2014-11-25 DIAGNOSIS — M1990 Unspecified osteoarthritis, unspecified site: Secondary | ICD-10-CM | POA: Diagnosis not present

## 2014-11-25 DIAGNOSIS — I10 Essential (primary) hypertension: Secondary | ICD-10-CM | POA: Insufficient documentation

## 2014-11-25 DIAGNOSIS — Z791 Long term (current) use of non-steroidal anti-inflammatories (NSAID): Secondary | ICD-10-CM | POA: Diagnosis not present

## 2014-11-25 DIAGNOSIS — Z720 Tobacco use: Secondary | ICD-10-CM | POA: Diagnosis not present

## 2014-11-25 DIAGNOSIS — F209 Schizophrenia, unspecified: Secondary | ICD-10-CM | POA: Diagnosis not present

## 2014-11-25 DIAGNOSIS — Z79899 Other long term (current) drug therapy: Secondary | ICD-10-CM | POA: Diagnosis not present

## 2014-11-25 DIAGNOSIS — M79671 Pain in right foot: Secondary | ICD-10-CM | POA: Diagnosis not present

## 2014-11-25 DIAGNOSIS — M109 Gout, unspecified: Secondary | ICD-10-CM | POA: Insufficient documentation

## 2014-11-25 DIAGNOSIS — G8929 Other chronic pain: Secondary | ICD-10-CM | POA: Insufficient documentation

## 2014-11-25 MED ORDER — KETOROLAC TROMETHAMINE 60 MG/2ML IM SOLN
60.0000 mg | Freq: Once | INTRAMUSCULAR | Status: AC
  Administered 2014-11-25: 60 mg via INTRAMUSCULAR
  Filled 2014-11-25: qty 2
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
## 2014-11-25 NOTE — ED Notes (Signed)
Gave food to pt.

## 2014-11-25 NOTE — ED Notes (Signed)
Patient here with complaint of difficulty administering Risperdal injection and question about chronic neck problem. EMS brought patient with 2 boxes of Risperdal IM injection doses. No other complaints at this time.

## 2014-12-21 ENCOUNTER — Emergency Department (HOSPITAL_COMMUNITY)
Admission: EM | Admit: 2014-12-21 | Discharge: 2014-12-21 | Disposition: A | Discharge status: Home / Self Care | Payer: Medicare Other | Attending: Emergency Medicine | Admitting: Emergency Medicine

## 2014-12-21 ENCOUNTER — Emergency Department (HOSPITAL_COMMUNITY): Payer: Medicare Other

## 2014-12-21 ENCOUNTER — Encounter (HOSPITAL_COMMUNITY): Payer: Self-pay

## 2014-12-21 DIAGNOSIS — S8992XA Unspecified injury of left lower leg, initial encounter: Secondary | ICD-10-CM | POA: Insufficient documentation

## 2014-12-21 DIAGNOSIS — Z87828 Personal history of other (healed) physical injury and trauma: Secondary | ICD-10-CM | POA: Insufficient documentation

## 2014-12-21 DIAGNOSIS — S8991XA Unspecified injury of right lower leg, initial encounter: Secondary | ICD-10-CM | POA: Diagnosis not present

## 2014-12-21 DIAGNOSIS — Z720 Tobacco use: Secondary | ICD-10-CM | POA: Insufficient documentation

## 2014-12-21 DIAGNOSIS — M25561 Pain in right knee: Secondary | ICD-10-CM

## 2014-12-21 DIAGNOSIS — Y9389 Activity, other specified: Secondary | ICD-10-CM | POA: Insufficient documentation

## 2014-12-21 DIAGNOSIS — M1990 Unspecified osteoarthritis, unspecified site: Secondary | ICD-10-CM | POA: Insufficient documentation

## 2014-12-21 DIAGNOSIS — F419 Anxiety disorder, unspecified: Secondary | ICD-10-CM | POA: Insufficient documentation

## 2014-12-21 DIAGNOSIS — I10 Essential (primary) hypertension: Secondary | ICD-10-CM | POA: Insufficient documentation

## 2014-12-21 DIAGNOSIS — S6991XA Unspecified injury of right wrist, hand and finger(s), initial encounter: Secondary | ICD-10-CM | POA: Insufficient documentation

## 2014-12-21 DIAGNOSIS — M79641 Pain in right hand: Secondary | ICD-10-CM

## 2014-12-21 DIAGNOSIS — G8929 Other chronic pain: Secondary | ICD-10-CM | POA: Diagnosis not present

## 2014-12-21 DIAGNOSIS — Y92410 Unspecified street and highway as the place of occurrence of the external cause: Secondary | ICD-10-CM | POA: Diagnosis not present

## 2014-12-21 DIAGNOSIS — Z79899 Other long term (current) drug therapy: Secondary | ICD-10-CM | POA: Diagnosis not present

## 2014-12-21 DIAGNOSIS — Y998 Other external cause status: Secondary | ICD-10-CM | POA: Insufficient documentation

## 2014-12-21 DIAGNOSIS — M25562 Pain in left knee: Secondary | ICD-10-CM

## 2014-12-21 DIAGNOSIS — Z791 Long term (current) use of non-steroidal anti-inflammatories (NSAID): Secondary | ICD-10-CM | POA: Diagnosis not present

## 2014-12-21 DIAGNOSIS — F329 Major depressive disorder, single episode, unspecified: Secondary | ICD-10-CM | POA: Insufficient documentation

## 2014-12-21 DIAGNOSIS — R52 Pain, unspecified: Secondary | ICD-10-CM

## 2014-12-21 LAB — I-STAT CHEM 8, ED
BUN: 10 mg/dL (ref 6–23)
Calcium, Ion: 1.15 mmol/L (ref 1.12–1.23)
Chloride: 104 mEq/L (ref 96–112)
Creatinine, Ser: 0.9 mg/dL (ref 0.50–1.35)
Glucose, Bld: 149 mg/dL — ABNORMAL HIGH (ref 70–99)
HCT: 44 % (ref 39.0–52.0)
Hemoglobin: 15 g/dL (ref 13.0–17.0)
Potassium: 3.4 mmol/L — ABNORMAL LOW (ref 3.5–5.1)
Sodium: 138 mmol/L (ref 135–145)
TCO2: 21 mmol/L (ref 0–100)

## 2014-12-21 MED ORDER — KETOROLAC TROMETHAMINE 60 MG/2ML IM SOLN
30.0000 mg | Freq: Once | INTRAMUSCULAR | Status: AC
  Administered 2014-12-21: 30 mg via INTRAMUSCULAR
  Filled 2014-12-21: qty 2
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
## 2014-12-21 NOTE — ED Notes (Signed)
Pt also c/o knee pain from arthritis.
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
## 2014-12-21 NOTE — Discharge Instructions (Signed)
Rest, Ice intermittently (in the first 24-48 hours), Gentle compression with an Ace wrap, and elevate (Limb above the level of the heart)   Take up to 800mg  of ibuprofen (that is usually 4 over the counter pills)  3 times a day for 5 days. Take with food.  Please follow with your primary care doctor in the next 2 days for a check-up. They must obtain records for further management.   Do not hesitate to return to the Emergency Department for any new, worsening or concerning symptoms.

## 2014-12-30 DIAGNOSIS — F250 Schizoaffective disorder, bipolar type: Secondary | ICD-10-CM | POA: Diagnosis not present

## 2015-01-05 DIAGNOSIS — M25561 Pain in right knee: Secondary | ICD-10-CM | POA: Diagnosis not present

## 2015-01-05 DIAGNOSIS — M170 Bilateral primary osteoarthritis of knee: Secondary | ICD-10-CM | POA: Diagnosis not present

## 2015-01-06 DIAGNOSIS — F250 Schizoaffective disorder, bipolar type: Secondary | ICD-10-CM | POA: Diagnosis not present

## 2015-01-10 ENCOUNTER — Encounter (HOSPITAL_COMMUNITY): Payer: Self-pay | Admitting: Emergency Medicine

## 2015-01-10 ENCOUNTER — Emergency Department (HOSPITAL_COMMUNITY)
Admission: EM | Admit: 2015-01-10 | Discharge: 2015-01-12 | Disposition: A | Discharge status: Home / Self Care | Payer: Medicare Other | Attending: Emergency Medicine | Admitting: Emergency Medicine

## 2015-01-10 DIAGNOSIS — Z791 Long term (current) use of non-steroidal anti-inflammatories (NSAID): Secondary | ICD-10-CM | POA: Diagnosis not present

## 2015-01-10 DIAGNOSIS — F419 Anxiety disorder, unspecified: Secondary | ICD-10-CM | POA: Diagnosis not present

## 2015-01-10 DIAGNOSIS — F209 Schizophrenia, unspecified: Secondary | ICD-10-CM

## 2015-01-10 DIAGNOSIS — Z87828 Personal history of other (healed) physical injury and trauma: Secondary | ICD-10-CM | POA: Diagnosis not present

## 2015-01-10 DIAGNOSIS — G8929 Other chronic pain: Secondary | ICD-10-CM | POA: Diagnosis not present

## 2015-01-10 DIAGNOSIS — F319 Bipolar disorder, unspecified: Secondary | ICD-10-CM | POA: Insufficient documentation

## 2015-01-10 DIAGNOSIS — F259 Schizoaffective disorder, unspecified: Secondary | ICD-10-CM | POA: Diagnosis not present

## 2015-01-10 DIAGNOSIS — Z79899 Other long term (current) drug therapy: Secondary | ICD-10-CM | POA: Diagnosis not present

## 2015-01-10 DIAGNOSIS — Z792 Long term (current) use of antibiotics: Secondary | ICD-10-CM | POA: Insufficient documentation

## 2015-01-10 DIAGNOSIS — F200 Paranoid schizophrenia: Secondary | ICD-10-CM | POA: Diagnosis present

## 2015-01-10 DIAGNOSIS — Z720 Tobacco use: Secondary | ICD-10-CM | POA: Diagnosis not present

## 2015-01-10 DIAGNOSIS — I10 Essential (primary) hypertension: Secondary | ICD-10-CM | POA: Diagnosis not present

## 2015-01-10 DIAGNOSIS — M1990 Unspecified osteoarthritis, unspecified site: Secondary | ICD-10-CM | POA: Diagnosis not present

## 2015-01-10 LAB — CBC
HCT: 40.2 % (ref 39.0–52.0)
Hemoglobin: 13.4 g/dL (ref 13.0–17.0)
MCH: 28.6 pg (ref 26.0–34.0)
MCHC: 33.3 g/dL (ref 30.0–36.0)
MCV: 85.9 fL (ref 78.0–100.0)
Platelets: 291 10*3/uL (ref 150–400)
RBC: 4.68 MIL/uL (ref 4.22–5.81)
RDW: 13.8 % (ref 11.5–15.5)
WBC: 13.6 10*3/uL — ABNORMAL HIGH (ref 4.0–10.5)

## 2015-01-10 LAB — COMPREHENSIVE METABOLIC PANEL
ALT: 38 U/L (ref 0–53)
AST: 30 U/L (ref 0–37)
Albumin: 4.2 g/dL (ref 3.5–5.2)
Alkaline Phosphatase: 69 U/L (ref 39–117)
Anion gap: 4 — ABNORMAL LOW (ref 5–15)
BUN: 10 mg/dL (ref 6–23)
CO2: 23 mmol/L (ref 19–32)
Calcium: 9 mg/dL (ref 8.4–10.5)
Chloride: 106 mEq/L (ref 96–112)
Creatinine, Ser: 0.75 mg/dL (ref 0.50–1.35)
GFR calc Af Amer: >90 mL/min (ref >90)
GFR calc non Af Amer: >90 mL/min (ref >90)
Glucose, Bld: 100 mg/dL — ABNORMAL HIGH (ref 70–99)
Potassium: 3.8 mmol/L (ref 3.5–5.1)
Sodium: 133 mmol/L — ABNORMAL LOW (ref 135–145)
Total Bilirubin: 0.3 mg/dL (ref 0.3–1.2)
Total Protein: 7.9 g/dL (ref 6.0–8.3)

## 2015-01-10 LAB — URINALYSIS, ROUTINE W REFLEX MICROSCOPIC
Bilirubin Urine: NEGATIVE
Glucose, UA: NEGATIVE mg/dL
Hgb urine dipstick: NEGATIVE
Ketones, ur: NEGATIVE mg/dL
Leukocytes, UA: NEGATIVE
Nitrite: NEGATIVE
Protein, ur: NEGATIVE mg/dL
Specific Gravity, Urine: 1.009 (ref 1.005–1.030)
Urobilinogen, UA: 0.2 mg/dL (ref 0.0–1.0)
pH: 6 (ref 5.0–8.0)

## 2015-01-10 LAB — ACETAMINOPHEN LEVEL: Acetaminophen (Tylenol), Serum: <10 ug/mL — ABNORMAL LOW (ref 10–30)

## 2015-01-10 LAB — RAPID URINE DRUG SCREEN, HOSP PERFORMED
Amphetamines: NOT DETECTED
Barbiturates: NOT DETECTED
Benzodiazepines: NOT DETECTED
Cocaine: NOT DETECTED
Opiates: NOT DETECTED
Tetrahydrocannabinol: NOT DETECTED

## 2015-01-10 LAB — ETHANOL: Alcohol, Ethyl (B): <5 mg/dL (ref 0–9)

## 2015-01-10 LAB — SALICYLATE LEVEL: Salicylate Lvl: <4 mg/dL (ref 2.8–20.0)

## 2015-01-10 MED ORDER — LORAZEPAM 1 MG PO TABS
1.0000 mg | ORAL_TABLET | Freq: Three times a day (TID) | ORAL | Status: DC | PRN
  Administered 2015-01-10 – 2015-01-12 (×5): 1 mg via ORAL
  Filled 2015-01-10 (×5): qty 1

## 2015-01-10 MED ORDER — NICOTINE 21 MG/24HR TD PT24
21.0000 mg | MEDICATED_PATCH | Freq: Every day | TRANSDERMAL | Status: DC
  Administered 2015-01-10 – 2015-01-12 (×3): 21 mg via TRANSDERMAL
  Filled 2015-01-10 (×2): qty 1

## 2015-01-10 MED ORDER — ZOLPIDEM TARTRATE 5 MG PO TABS
5.0000 mg | ORAL_TABLET | Freq: Every evening | ORAL | Status: DC | PRN
  Administered 2015-01-12: 5 mg via ORAL
  Filled 2015-01-10 (×2): qty 1

## 2015-01-10 MED ORDER — ONDANSETRON HCL 4 MG PO TABS: 4.0000 mg | ORAL_TABLET | Freq: Three times a day (TID) | ORAL | Status: DC | PRN

## 2015-01-10 MED ORDER — IBUPROFEN 200 MG PO TABS
600.0000 mg | ORAL_TABLET | Freq: Three times a day (TID) | ORAL | Status: DC | PRN
  Administered 2015-01-10 – 2015-01-12 (×4): 600 mg via ORAL
  Filled 2015-01-10 (×4): qty 3

## 2015-01-10 MED ORDER — ACETAMINOPHEN 325 MG PO TABS
650.0000 mg | ORAL_TABLET | ORAL | Status: DC | PRN
  Administered 2015-01-11: 650 mg via ORAL
  Filled 2015-01-10: qty 2

## 2015-01-10 MED ORDER — TRAMADOL HCL 50 MG PO TABS
50.0000 mg | ORAL_TABLET | Freq: Once | ORAL | Status: AC
  Administered 2015-01-10: 50 mg via ORAL
  Filled 2015-01-10: qty 1

## 2015-01-10 MED ORDER — ALUM & MAG HYDROXIDE-SIMETH 200-200-20 MG/5ML PO SUSP: 30.0000 mL | ORAL | Status: DC | PRN
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
## 2015-01-10 NOTE — BH Assessment (Signed)
Spoke with Dr. Mingo Amber prior to assessment. Pt has hx of getting off his medication. Has flight of ideas, tangential speech. Some labs are pending, but appears okay medically at this time.  Assessment to commence shortly.    Lear Ng, [facility] Triage Specialist 01/10/2015 8:38 PM
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
## 2015-01-10 NOTE — ED Notes (Signed)
Pt arrived to the ED with a police escort.  Pt states he has not been taking his medication for four days.  Pt has flight of ideas.  Pt can not keep his story connected and wanders in and out of conversation

## 2015-01-11 DIAGNOSIS — F259 Schizoaffective disorder, unspecified: Secondary | ICD-10-CM

## 2015-01-11 DIAGNOSIS — F209 Schizophrenia, unspecified: Secondary | ICD-10-CM | POA: Diagnosis not present

## 2015-01-11 MED ORDER — FLUOXETINE HCL 20 MG PO CAPS
40.0000 mg | ORAL_CAPSULE | Freq: Every day | ORAL | Status: DC
  Administered 2015-01-11 – 2015-01-12 (×2): 40 mg via ORAL
  Filled 2015-01-11 (×2): qty 2

## 2015-01-11 MED ORDER — FLUTICASONE PROPIONATE 50 MCG/ACT NA SUSP
1.0000 | Freq: Two times a day (BID) | NASAL | Status: DC
  Administered 2015-01-11 – 2015-01-12 (×2): 1 via NASAL
  Filled 2015-01-11: qty 16

## 2015-01-11 MED ORDER — CARVEDILOL 25 MG PO TABS
25.0000 mg | ORAL_TABLET | Freq: Two times a day (BID) | ORAL | Status: DC
  Administered 2015-01-11 – 2015-01-12 (×3): 25 mg via ORAL
  Filled 2015-01-11 (×7): qty 1

## 2015-01-11 MED ORDER — IBUPROFEN 200 MG PO TABS
600.0000 mg | ORAL_TABLET | Freq: Once | ORAL | Status: AC
  Administered 2015-01-11: 600 mg via ORAL
  Filled 2015-01-11: qty 3

## 2015-01-11 MED ORDER — BENZTROPINE MESYLATE 1 MG PO TABS
1.0000 mg | ORAL_TABLET | Freq: Every day | ORAL | Status: DC
  Administered 2015-01-11 – 2015-01-12 (×2): 1 mg via ORAL
  Filled 2015-01-11 (×2): qty 1

## 2015-01-11 MED ORDER — HYDROCHLOROTHIAZIDE 25 MG PO TABS
25.0000 mg | ORAL_TABLET | Freq: Every day | ORAL | Status: DC
  Administered 2015-01-11 – 2015-01-12 (×2): 25 mg via ORAL
  Filled 2015-01-11 (×2): qty 1
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
## 2015-01-11 NOTE — Progress Notes (Signed)
  CARE MANAGEMENT ED NOTE 01/11/2015  Patient:  Jacob [name], Jacob [name]   Account Number:  [id number]  Date Initiated:  01/11/2015  Documentation initiated by:  Jackelyn Poling  Subjective/Objective Assessment:   49 yr old [place] pt arrived to the ED with a police escort.  Pt states he has not been taking his medication for four days.  Pt has flight of ideas.  Pt can not keep his story connected and wanders in and     Subjective/Objective Assessment Detail:   out of conversation  Listed in Medicaid response history in EPIC is pcp Dr. Kristie Cowman  Pt informed CM he does not know who this pcp is and had never been to see Ronnald Ramp and prefers to see Dr York Ram on [address] [place] Reports his feelings were hurt when he went to see Dr Jimmye Norman last and was informed he could not  Pt reports having a copy of his new medicaid card with Dr Ronnald Ramp listed but did not understand why  Pt voiced understanding of information discussed and what needs to be done at [place] with change of pcp on medicaid card  Pt states he has a DSS case worker     Action/Plan:   CM noted pc listed in EPIC for pt as York Ram and Medicaid response history in EPIC is pcp Dr. Kristie Cowman Spoke with pt Discuss this issue.  Discussed how to contact DSS to get pcp changed to his preferred pcp Dr Jimmye Norman   Action/Plan Detail:   Entered instruction in d/c instruction and provided written copy of DSS number, address to pt   Anticipated DC Date:       Status Recommendation to Physician:   Result of Recommendation:    Other ED Services  Consult [facility] [place]  Other  Outpatient Services - Pt will follow up  PCP issues    Choice offered to / List presented to:            Status of service:  Completed, signed off  ED Comments:   ED Comments Detail:

## 2015-01-11 NOTE — BH Assessment (Signed)
Called Tigh Klick [phone] and left a VM requesting a call back for collateral information regarding pt.   Lear Ng, [facility] Triage Specialist 01/11/2015 11:14 PM
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
## 2015-01-11 NOTE — BH Assessment (Signed)
[place] Assessment Progress Note  Pt's IVC has been rescinded by Corena Pilgrim, MD.  Jalene Mullet, MA Triage Specialist 01/11/2015 @ 14:36

## 2015-01-11 NOTE — ED Notes (Signed)
Pt reports he has not slept since 0300. Requesting ativan as previous shift RN told him he could have it after 0700. Also c/o pain and new bruising since last night to bilateral anterior thighs. Deep purple bruise approx 7cm x 4cm to L thigh, small nickle size bruise to R thigh. Pt denies known injury. Sts areas are sore. No other c/o pain. Will inform MD on rounds. Pt also requesting coffee. Provided. Pt appears slightly flight of ideas, jumping from subject to unrelated subject quickly. Pt alert and oriented, appreciative of care.

## 2015-01-11 NOTE — ED Notes (Signed)
Patient is resting comfortably. 

## 2015-01-11 NOTE — Progress Notes (Signed)
CSW left message for pt brother, at [place], [place]  ED CSW 01/11/2015 1232pm

## 2015-01-12 DIAGNOSIS — F209 Schizophrenia, unspecified: Secondary | ICD-10-CM | POA: Diagnosis not present

## 2015-01-12 DIAGNOSIS — F259 Schizoaffective disorder, unspecified: Secondary | ICD-10-CM | POA: Diagnosis not present
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
## 2015-01-12 NOTE — Progress Notes (Signed)
CSW called and left message for pt brother Lorcan Wellen at [phone]. CSW called multiple times following to see if patient brother would answer the phone.   CSW spoke with Jeri Lager patient father who agreed to pick up patient at approximately Donalda Ewings [phone]  ED CSW 01/12/2015 0807am

## 2015-01-12 NOTE — ED Notes (Signed)
Patient reports anxiety, difficulty sleeping and headache. Patient will be medicated per MAR.

## 2015-01-21 DIAGNOSIS — F250 Schizoaffective disorder, bipolar type: Secondary | ICD-10-CM | POA: Diagnosis not present

## 2015-02-03 DIAGNOSIS — F250 Schizoaffective disorder, bipolar type: Secondary | ICD-10-CM | POA: Diagnosis not present

## 2015-02-08 DIAGNOSIS — F250 Schizoaffective disorder, bipolar type: Secondary | ICD-10-CM | POA: Diagnosis not present

## 2015-02-23 DIAGNOSIS — F250 Schizoaffective disorder, bipolar type: Secondary | ICD-10-CM | POA: Diagnosis not present

## 2015-02-25 DIAGNOSIS — M1712 Unilateral primary osteoarthritis, left knee: Secondary | ICD-10-CM | POA: Diagnosis not present

## 2015-02-25 DIAGNOSIS — M1711 Unilateral primary osteoarthritis, right knee: Secondary | ICD-10-CM | POA: Diagnosis not present

## 2015-03-06 ENCOUNTER — Emergency Department (HOSPITAL_COMMUNITY)
Admission: EM | Admit: 2015-03-06 | Discharge: 2015-03-06 | Disposition: A | Discharge status: Home / Self Care | Payer: Medicare Other | Attending: Emergency Medicine | Admitting: Emergency Medicine

## 2015-03-06 ENCOUNTER — Encounter (HOSPITAL_COMMUNITY): Payer: Self-pay | Admitting: Emergency Medicine

## 2015-03-06 ENCOUNTER — Emergency Department (HOSPITAL_COMMUNITY): Payer: Medicare Other

## 2015-03-06 DIAGNOSIS — S79911A Unspecified injury of right hip, initial encounter: Secondary | ICD-10-CM | POA: Diagnosis not present

## 2015-03-06 DIAGNOSIS — M25511 Pain in right shoulder: Secondary | ICD-10-CM | POA: Diagnosis not present

## 2015-03-06 DIAGNOSIS — Z791 Long term (current) use of non-steroidal anti-inflammatories (NSAID): Secondary | ICD-10-CM | POA: Diagnosis not present

## 2015-03-06 DIAGNOSIS — M1990 Unspecified osteoarthritis, unspecified site: Secondary | ICD-10-CM | POA: Insufficient documentation

## 2015-03-06 DIAGNOSIS — T148 Other injury of unspecified body region: Secondary | ICD-10-CM | POA: Diagnosis not present

## 2015-03-06 DIAGNOSIS — M549 Dorsalgia, unspecified: Secondary | ICD-10-CM | POA: Diagnosis not present

## 2015-03-06 DIAGNOSIS — M25519 Pain in unspecified shoulder: Secondary | ICD-10-CM | POA: Diagnosis not present

## 2015-03-06 DIAGNOSIS — W19XXXA Unspecified fall, initial encounter: Secondary | ICD-10-CM

## 2015-03-06 DIAGNOSIS — M545 Low back pain: Secondary | ICD-10-CM | POA: Diagnosis not present

## 2015-03-06 DIAGNOSIS — Z79899 Other long term (current) drug therapy: Secondary | ICD-10-CM | POA: Insufficient documentation

## 2015-03-06 DIAGNOSIS — Y9289 Other specified places as the place of occurrence of the external cause: Secondary | ICD-10-CM | POA: Insufficient documentation

## 2015-03-06 DIAGNOSIS — Y998 Other external cause status: Secondary | ICD-10-CM | POA: Diagnosis not present

## 2015-03-06 DIAGNOSIS — F419 Anxiety disorder, unspecified: Secondary | ICD-10-CM | POA: Insufficient documentation

## 2015-03-06 DIAGNOSIS — Y9389 Activity, other specified: Secondary | ICD-10-CM | POA: Diagnosis not present

## 2015-03-06 DIAGNOSIS — G8929 Other chronic pain: Secondary | ICD-10-CM | POA: Insufficient documentation

## 2015-03-06 DIAGNOSIS — S3992XA Unspecified injury of lower back, initial encounter: Secondary | ICD-10-CM | POA: Insufficient documentation

## 2015-03-06 DIAGNOSIS — F319 Bipolar disorder, unspecified: Secondary | ICD-10-CM | POA: Insufficient documentation

## 2015-03-06 DIAGNOSIS — S4991XA Unspecified injury of right shoulder and upper arm, initial encounter: Secondary | ICD-10-CM | POA: Insufficient documentation

## 2015-03-06 DIAGNOSIS — Z720 Tobacco use: Secondary | ICD-10-CM | POA: Diagnosis not present

## 2015-03-06 DIAGNOSIS — I10 Essential (primary) hypertension: Secondary | ICD-10-CM | POA: Diagnosis not present

## 2015-03-06 MED ORDER — KETOROLAC TROMETHAMINE 60 MG/2ML IM SOLN
60.0000 mg | Freq: Once | INTRAMUSCULAR | Status: AC
  Administered 2015-03-06: 60 mg via INTRAMUSCULAR
  Filled 2015-03-06: qty 2

## 2015-03-06 MED ORDER — NAPROXEN 500 MG PO TABS: 500.0000 mg | ORAL_TABLET | Freq: Two times a day (BID) | ORAL | Status: DC

## 2015-03-06 MED ORDER — GI COCKTAIL ~~LOC~~
30.0000 mL | Freq: Once | ORAL | Status: AC
  Administered 2015-03-06: 30 mL via ORAL
  Filled 2015-03-06: qty 30

## 2015-03-06 MED ORDER — OXYCODONE-ACETAMINOPHEN 5-325 MG PO TABS
1.0000 | ORAL_TABLET | Freq: Once | ORAL | Status: AC
  Administered 2015-03-06: 1 via ORAL
  Filled 2015-03-06: qty 1
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
## 2015-03-06 NOTE — Discharge Instructions (Signed)
Please follow the directions provided.  Be sure to follow-up with the orthopedic doctor if your pain does not improve.  Take the naproxen twice a day for pain.  Wear your sling for comfort.  Don't hesitate to return for any new, worsening or concerning symptoms.     SEEK IMMEDIATE MEDICAL CARE IF:  Your arm, hand, or fingers are numb or tingling.  Your arm, hand, or fingers are significantly swollen or turn white or blue.
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
## 2015-03-06 NOTE — ED Notes (Signed)
Patient transported to X-ray 

## 2015-03-10 DIAGNOSIS — F250 Schizoaffective disorder, bipolar type: Secondary | ICD-10-CM | POA: Diagnosis not present

## 2015-03-21 ENCOUNTER — Encounter (HOSPITAL_COMMUNITY): Payer: Self-pay | Admitting: Emergency Medicine

## 2015-03-21 ENCOUNTER — Emergency Department (HOSPITAL_COMMUNITY)
Admission: EM | Admit: 2015-03-21 | Discharge: 2015-03-21 | Disposition: A | Discharge status: Home / Self Care | Payer: Medicare Other | Attending: Emergency Medicine | Admitting: Emergency Medicine

## 2015-03-21 DIAGNOSIS — Z720 Tobacco use: Secondary | ICD-10-CM | POA: Insufficient documentation

## 2015-03-21 DIAGNOSIS — F209 Schizophrenia, unspecified: Secondary | ICD-10-CM | POA: Insufficient documentation

## 2015-03-21 DIAGNOSIS — Z79899 Other long term (current) drug therapy: Secondary | ICD-10-CM | POA: Insufficient documentation

## 2015-03-21 DIAGNOSIS — I10 Essential (primary) hypertension: Secondary | ICD-10-CM | POA: Insufficient documentation

## 2015-03-21 DIAGNOSIS — M25461 Effusion, right knee: Secondary | ICD-10-CM | POA: Insufficient documentation

## 2015-03-21 DIAGNOSIS — M25562 Pain in left knee: Secondary | ICD-10-CM | POA: Insufficient documentation

## 2015-03-21 DIAGNOSIS — M25511 Pain in right shoulder: Secondary | ICD-10-CM | POA: Insufficient documentation

## 2015-03-21 DIAGNOSIS — M25561 Pain in right knee: Secondary | ICD-10-CM | POA: Insufficient documentation

## 2015-03-21 DIAGNOSIS — M25462 Effusion, left knee: Secondary | ICD-10-CM | POA: Diagnosis not present

## 2015-03-21 DIAGNOSIS — Z791 Long term (current) use of non-steroidal anti-inflammatories (NSAID): Secondary | ICD-10-CM | POA: Diagnosis not present

## 2015-03-21 DIAGNOSIS — R52 Pain, unspecified: Secondary | ICD-10-CM | POA: Diagnosis present

## 2015-03-21 DIAGNOSIS — G8911 Acute pain due to trauma: Secondary | ICD-10-CM | POA: Insufficient documentation

## 2015-03-21 DIAGNOSIS — M109 Gout, unspecified: Secondary | ICD-10-CM | POA: Insufficient documentation

## 2015-03-21 DIAGNOSIS — M7981 Nontraumatic hematoma of soft tissue: Secondary | ICD-10-CM | POA: Insufficient documentation

## 2015-03-21 DIAGNOSIS — F319 Bipolar disorder, unspecified: Secondary | ICD-10-CM | POA: Diagnosis not present

## 2015-03-21 DIAGNOSIS — M545 Low back pain, unspecified: Secondary | ICD-10-CM

## 2015-03-21 DIAGNOSIS — G8929 Other chronic pain: Secondary | ICD-10-CM | POA: Diagnosis not present

## 2015-03-21 DIAGNOSIS — M25569 Pain in unspecified knee: Secondary | ICD-10-CM | POA: Diagnosis not present

## 2015-03-21 DIAGNOSIS — Z87828 Personal history of other (healed) physical injury and trauma: Secondary | ICD-10-CM | POA: Insufficient documentation

## 2015-03-21 DIAGNOSIS — F419 Anxiety disorder, unspecified: Secondary | ICD-10-CM | POA: Insufficient documentation

## 2015-03-21 DIAGNOSIS — M1990 Unspecified osteoarthritis, unspecified site: Secondary | ICD-10-CM | POA: Diagnosis not present

## 2015-03-21 MED ORDER — DICLOFENAC SODIUM 1 % TD GEL: 2.0000 g | Freq: Two times a day (BID) | TRANSDERMAL | Status: DC

## 2015-03-21 MED ORDER — OXYCODONE-ACETAMINOPHEN 5-325 MG PO TABS
2.0000 | ORAL_TABLET | Freq: Once | ORAL | Status: AC
  Administered 2015-03-21: 2 via ORAL
  Filled 2015-03-21: qty 2
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
## 2015-03-21 NOTE — Discharge Instructions (Signed)
Apply Voltaren gel to your shoulder as prescribed. Alternate ice and heat to areas of injury, including your low back. Attempt to stretch your shoulder 3-4 times per day. Follow up with Dr. Sharol Given regarding your knee pain and your pain management doctor if your pain is not controlled by the Percocet they prescribe. Return to the ED as needed if symptoms worsen.  Adhesive Capsulitis Sometimes the shoulder becomes stiff and is painful to move. Some people say it feels as if the shoulder is frozen in place. Because of this, the condition is called "frozen shoulder." Its medical name is adhesive capsulitis.  The shoulder joint is made up of strong connective tissue that attaches the ball of the humerus to the shallow shoulder socket. This strong connective tissue is called the joint capsule. This tissue can become stiff and swollen. That is when adhesive capsulitis sets in. CAUSES  It is not always clear just what the cause adhesive capsulitis. Possibilities include:  Injury to the shoulder joint.  Strain. This is a repetitive injury brought about by overuse.  Lack of use. Perhaps your arm or hand was otherwise injured. It might have been in a sling for awhile. Or perhaps you were not using it to avoid pain.  Referred pain. This is a sort of trick the body plays. You feel pain in the shoulder. But, the pain actually comes from an injury somewhere else in the body.  Long-standing health problems. Several diseases can cause adhesive capsulitis. They include diabetes, heart disease, stroke, thyroid problems, rheumatoid arthritis and lung disease.  Being a women older than 78. Anyone can develop adhesive capsulitis but it is most common in women in this age group. SYMPTOMS   Pain.  It occurs when the arm is moved.  Parts of the shoulder might hurt if they are touched.  Pain is worse at night or when resting.  Soreness. It might not be strong enough to be called pain. But, the shoulder  aches.  The shoulder does not move freely.  Muscle spasms.  Trouble sleeping because of shoulder ache or pain. DIAGNOSIS  To decide if you have adhesive capsulitis, your healthcare provider will probably:  Ask about symptoms you have noticed.  Ask about your history of joint pain and anything that might have caused the pain.  Ask about your overall health.  Use hands to feel your shoulder and neck.  Ask you to move your shoulder in specific directions. This may indicate the origin of the pain.  Order imaging tests; pictures of the shoulder. They help pinpoint the source of the problem. An X-ray might be used. For more detail, an MRI is often used. An MRI details the tendons, muscles and ligaments as well as the joint. TREATMENT  Adhesive capsulitis can be treated several ways. Most treatments can be done in a clinic or in your healthcare provider's office. Be sure to discuss the different options with your caregiver. They include:  Physical therapy. You will work on specific exercises to get your shoulder moving again. The exercises usually involve stretching. A physical therapist (a caregiver with special training) can show you what to do and what not to do. The exercises will need to be done daily.  Medication.  Over-the-counter medicines may relieve pain and inflammation (the body's way of reacting to injury or infection).  Corticosteroids. These are stronger drugs to reduce pain and inflammation. They are given by injection (shots) into the shoulder joint. Frequent treatment is not recommended.  Muscle relaxants.  Medication may be prescribed to ease muscle spasms.  Treatment of underlying conditions. This means treating another condition that is causing your shoulder problem. This might be a rotator cuff (tendon) problem  Shoulder manipulation. The shoulder will be moved by your healthcare provider. You would be under general anesthesia (given a drug that puts you to sleep).  You would not feel anything. Sometimes the joint will be injected with salt water (saline) at high pressure to break down internal scarring in the joint capsule.  Surgery. This is rarely needed. It may be suggested in advanced cases after all other treatment has failed. PROGNOSIS  In time, most people recover from adhesive capsulitis. Sometimes, however, the pain goes away but full movement of the shoulder does not return.  HOME CARE INSTRUCTIONS   Take any pain medications recommended by your healthcare provider. Follow the directions carefully.  If you have physical therapy, follow through with the therapist's suggestions. Be sure you understand the exercises you will be doing. You should understand:  How often the exercises should be done.  How many times each exercise should be repeated.  How long they should be done.  What other activities you should do, or not do.  That you should warm up before doing any exercise. Just 5 to 10 minutes will help. Small, gentle movements should get your shoulder ready for more.  Avoid high-demand exercise that involves your shoulder such as throwing. This type of exercise can make pain worse.  Consider using cold packs. Cold may ease swelling and pain. Ask your healthcare provider if a cold pack might help you. If so, get directions on how and when to use them. SEEK MEDICAL CARE IF:   You have any questions about your medications.  Your pain continues to increase. Document Released: 10/08/2009 Document Revised: 03/04/2012 Document Reviewed: 10/08/2009 [facility] Patient Information 2015 [place], [place]. This information is not intended to replace advice given to you by your health care provider. Make sure you discuss any questions you have with your health care provider.  Back Pain, Adult Low back pain is very common. About 1 in 5 people have back pain.The cause of low back pain is rarely dangerous. The pain often gets better over time.About  half of people with a sudden onset of back pain feel better in just 2 weeks. About 8 in 10 people feel better by 6 weeks.  CAUSES Some common causes of back pain include:  Strain of the muscles or ligaments supporting the spine.  Wear and tear (degeneration) of the spinal discs.  Arthritis.  Direct injury to the back. DIAGNOSIS Most of the time, the direct cause of low back pain is not known.However, back pain can be treated effectively even when the exact cause of the pain is unknown.Answering your caregiver's questions about your overall health and symptoms is one of the most accurate ways to make sure the cause of your pain is not dangerous. If your caregiver needs more information, he or she may order lab work or imaging tests (X-rays or MRIs).However, even if imaging tests show changes in your back, this usually does not require surgery. HOME CARE INSTRUCTIONS For many people, back pain returns.Since low back pain is rarely dangerous, it is often a condition that people can learn to [facility] their own.   Remain active. It is stressful on the back to sit or stand in one place. Do not sit, drive, or stand in one place for more than 30 minutes at a time.  Take short walks on level surfaces as soon as pain allows.Try to increase the length of time you walk each day.  Do not stay in bed.Resting more than 1 or 2 days can delay your recovery.  Do not avoid exercise or work.Your body is made to move.It is not dangerous to be active, even though your back may hurt.Your back will likely heal faster if you return to being active before your pain is gone.  Pay attention to your body when you bend and lift. Many people have less discomfortwhen lifting if they bend their knees, keep the load close to their bodies,and avoid twisting. Often, the most comfortable positions are those that put less stress on your recovering back.  Find a comfortable position to sleep. Use a firm mattress and  lie on your side with your knees slightly bent. If you lie on your back, put a pillow under your knees.  Only take over-the-counter or prescription medicines as directed by your caregiver. Over-the-counter medicines to reduce pain and inflammation are often the most helpful.Your caregiver may prescribe muscle relaxant drugs.These medicines help dull your pain so you can more quickly return to your normal activities and healthy exercise.  Put ice on the injured area.  Put ice in a plastic bag.  Place a towel between your skin and the bag.  Leave the ice on for 15-20 minutes, 03-04 times a day for the first 2 to 3 days. After that, ice and heat may be alternated to reduce pain and spasms.  Ask your caregiver about trying back exercises and gentle massage. This may be of some benefit.  Avoid feeling anxious or stressed.Stress increases muscle tension and can worsen back pain.It is important to recognize when you are anxious or stressed and learn ways to manage it.Exercise is a great option. SEEK MEDICAL CARE IF:  You have pain that is not relieved with rest or medicine.  You have pain that does not improve in 1 week.  You have new symptoms.  You are generally not feeling well. SEEK IMMEDIATE MEDICAL CARE IF:   You have pain that radiates from your back into your legs.  You develop new bowel or bladder control problems.  You have unusual weakness or numbness in your arms or legs.  You develop nausea or vomiting.  You develop abdominal pain.  You feel faint. Document Released: 12/11/2005 Document Revised: 06/11/2012 Document Reviewed: 04/14/2014 [facility] Patient Information 2015 [place], [place]. This information is not intended to replace advice given to you by your health care provider. Make sure you discuss any questions you have with your health care provider.

## 2015-03-22 DIAGNOSIS — M1711 Unilateral primary osteoarthritis, right knee: Secondary | ICD-10-CM | POA: Diagnosis not present

## 2015-03-22 DIAGNOSIS — M1712 Unilateral primary osteoarthritis, left knee: Secondary | ICD-10-CM | POA: Diagnosis not present

## 2015-03-30 DIAGNOSIS — F250 Schizoaffective disorder, bipolar type: Secondary | ICD-10-CM | POA: Diagnosis not present

## 2015-04-06 DIAGNOSIS — F250 Schizoaffective disorder, bipolar type: Secondary | ICD-10-CM | POA: Diagnosis not present

## 2015-04-15 DIAGNOSIS — F250 Schizoaffective disorder, bipolar type: Secondary | ICD-10-CM | POA: Diagnosis not present

## 2015-04-22 ENCOUNTER — Emergency Department (HOSPITAL_COMMUNITY)
Admission: EM | Admit: 2015-04-22 | Discharge: 2015-04-22 | Disposition: A | Discharge status: Home / Self Care | Payer: Medicare Other | Attending: Emergency Medicine | Admitting: Emergency Medicine

## 2015-04-22 ENCOUNTER — Encounter (HOSPITAL_COMMUNITY): Payer: Self-pay | Admitting: Nurse Practitioner

## 2015-04-22 DIAGNOSIS — M25562 Pain in left knee: Secondary | ICD-10-CM | POA: Diagnosis not present

## 2015-04-22 DIAGNOSIS — Z720 Tobacco use: Secondary | ICD-10-CM | POA: Diagnosis not present

## 2015-04-22 DIAGNOSIS — G8929 Other chronic pain: Secondary | ICD-10-CM | POA: Insufficient documentation

## 2015-04-22 DIAGNOSIS — M25561 Pain in right knee: Secondary | ICD-10-CM

## 2015-04-22 DIAGNOSIS — Z87828 Personal history of other (healed) physical injury and trauma: Secondary | ICD-10-CM | POA: Insufficient documentation

## 2015-04-22 DIAGNOSIS — F419 Anxiety disorder, unspecified: Secondary | ICD-10-CM | POA: Diagnosis not present

## 2015-04-22 DIAGNOSIS — R6889 Other general symptoms and signs: Secondary | ICD-10-CM | POA: Diagnosis not present

## 2015-04-22 DIAGNOSIS — M1990 Unspecified osteoarthritis, unspecified site: Secondary | ICD-10-CM | POA: Diagnosis not present

## 2015-04-22 DIAGNOSIS — Z79899 Other long term (current) drug therapy: Secondary | ICD-10-CM | POA: Insufficient documentation

## 2015-04-22 DIAGNOSIS — Z791 Long term (current) use of non-steroidal anti-inflammatories (NSAID): Secondary | ICD-10-CM | POA: Diagnosis not present

## 2015-04-22 DIAGNOSIS — F329 Major depressive disorder, single episode, unspecified: Secondary | ICD-10-CM | POA: Diagnosis not present

## 2015-04-22 NOTE — ED Notes (Signed)
Pt c/o bilateral knee pain, states he was late for his scheduled knee draining appointment earlier today.

## 2015-04-22 NOTE — ED Notes (Signed)
Bed: WA03 Expected date:  Expected time:  Means of arrival:  Comments: EMS 49 yo male with bilateral knee pain/missed appointment to have knee's drained

## 2015-04-22 NOTE — Discharge Instructions (Signed)
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
## 2015-04-23 DIAGNOSIS — M1712 Unilateral primary osteoarthritis, left knee: Secondary | ICD-10-CM | POA: Diagnosis not present

## 2015-04-23 DIAGNOSIS — M1711 Unilateral primary osteoarthritis, right knee: Secondary | ICD-10-CM | POA: Diagnosis not present

## 2015-05-13 DIAGNOSIS — F250 Schizoaffective disorder, bipolar type: Secondary | ICD-10-CM | POA: Diagnosis not present

## 2015-05-14 ENCOUNTER — Emergency Department (HOSPITAL_COMMUNITY)
Admission: EM | Admit: 2015-05-14 | Discharge: 2015-05-14 | Disposition: A | Discharge status: Home / Self Care | Payer: Medicare Other | Attending: Emergency Medicine | Admitting: Emergency Medicine

## 2015-05-14 ENCOUNTER — Encounter (HOSPITAL_COMMUNITY): Payer: Self-pay | Admitting: Emergency Medicine

## 2015-05-14 DIAGNOSIS — Z792 Long term (current) use of antibiotics: Secondary | ICD-10-CM | POA: Insufficient documentation

## 2015-05-14 DIAGNOSIS — R61 Generalized hyperhidrosis: Secondary | ICD-10-CM | POA: Diagnosis not present

## 2015-05-14 DIAGNOSIS — F13230 Sedative, hypnotic or anxiolytic dependence with withdrawal, uncomplicated: Secondary | ICD-10-CM | POA: Insufficient documentation

## 2015-05-14 DIAGNOSIS — Z87828 Personal history of other (healed) physical injury and trauma: Secondary | ICD-10-CM | POA: Insufficient documentation

## 2015-05-14 DIAGNOSIS — F319 Bipolar disorder, unspecified: Secondary | ICD-10-CM | POA: Insufficient documentation

## 2015-05-14 DIAGNOSIS — F209 Schizophrenia, unspecified: Secondary | ICD-10-CM | POA: Diagnosis not present

## 2015-05-14 DIAGNOSIS — F1193 Opioid use, unspecified with withdrawal: Secondary | ICD-10-CM

## 2015-05-14 DIAGNOSIS — Z79899 Other long term (current) drug therapy: Secondary | ICD-10-CM | POA: Insufficient documentation

## 2015-05-14 DIAGNOSIS — M1712 Unilateral primary osteoarthritis, left knee: Secondary | ICD-10-CM | POA: Diagnosis not present

## 2015-05-14 DIAGNOSIS — F419 Anxiety disorder, unspecified: Secondary | ICD-10-CM | POA: Insufficient documentation

## 2015-05-14 DIAGNOSIS — F13939 Sedative, hypnotic or anxiolytic use, unspecified with withdrawal, unspecified: Secondary | ICD-10-CM

## 2015-05-14 DIAGNOSIS — R6883 Chills (without fever): Secondary | ICD-10-CM | POA: Insufficient documentation

## 2015-05-14 DIAGNOSIS — F1123 Opioid dependence with withdrawal: Secondary | ICD-10-CM | POA: Insufficient documentation

## 2015-05-14 DIAGNOSIS — I10 Essential (primary) hypertension: Secondary | ICD-10-CM | POA: Diagnosis not present

## 2015-05-14 DIAGNOSIS — G8929 Other chronic pain: Secondary | ICD-10-CM | POA: Diagnosis not present

## 2015-05-14 DIAGNOSIS — R112 Nausea with vomiting, unspecified: Secondary | ICD-10-CM | POA: Insufficient documentation

## 2015-05-14 DIAGNOSIS — M109 Gout, unspecified: Secondary | ICD-10-CM | POA: Diagnosis not present

## 2015-05-14 DIAGNOSIS — M1711 Unilateral primary osteoarthritis, right knee: Secondary | ICD-10-CM | POA: Diagnosis not present

## 2015-05-14 DIAGNOSIS — Z720 Tobacco use: Secondary | ICD-10-CM | POA: Insufficient documentation

## 2015-05-14 DIAGNOSIS — Z7951 Long term (current) use of inhaled steroids: Secondary | ICD-10-CM | POA: Diagnosis not present

## 2015-05-14 DIAGNOSIS — R197 Diarrhea, unspecified: Secondary | ICD-10-CM | POA: Diagnosis not present

## 2015-05-14 DIAGNOSIS — F13239 Sedative, hypnotic or anxiolytic dependence with withdrawal, unspecified: Secondary | ICD-10-CM

## 2015-05-14 DIAGNOSIS — Z760 Encounter for issue of repeat prescription: Secondary | ICD-10-CM | POA: Diagnosis not present

## 2015-05-14 MED ORDER — ALPRAZOLAM 0.25 MG PO TABS
0.5000 mg | ORAL_TABLET | Freq: Once | ORAL | Status: AC
  Administered 2015-05-14: 0.5 mg via ORAL
  Filled 2015-05-14 (×2): qty 2

## 2015-05-14 MED ORDER — ALPRAZOLAM 0.5 MG PO TABS: 0.5000 mg | ORAL_TABLET | Freq: Two times a day (BID) | ORAL | Status: DC | PRN

## 2015-05-14 MED ORDER — DIPHENOXYLATE-ATROPINE 2.5-0.025 MG PO TABS: 1.0000 | ORAL_TABLET | Freq: Four times a day (QID) | ORAL | Status: DC | PRN

## 2015-05-14 MED ORDER — CYCLOBENZAPRINE HCL 10 MG PO TABS: 10.0000 mg | ORAL_TABLET | Freq: Two times a day (BID) | ORAL | Status: DC | PRN

## 2015-05-14 MED ORDER — OXYCODONE-ACETAMINOPHEN 5-325 MG PO TABS
1.0000 | ORAL_TABLET | Freq: Once | ORAL | Status: AC
  Administered 2015-05-14: 1 via ORAL
  Filled 2015-05-14: qty 1

## 2015-05-14 MED ORDER — ONDANSETRON HCL 4 MG PO TABS: 4.0000 mg | ORAL_TABLET | Freq: Four times a day (QID) | ORAL | Status: DC
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
## 2015-05-14 NOTE — ED Notes (Signed)
Patient didn't want last set of vitals taken.

## 2015-05-14 NOTE — Discharge Instructions (Signed)
Chemical Dependency Chemical dependency is an addiction to drugs or alcohol. It is characterized by the repeated behavior of seeking out and using drugs and alcohol despite harmful consequences to the health and safety of ones self and others.  RISK FACTORS There are certain situations or behaviors that increase a person's risk for chemical dependency. These include:  A family history of chemical dependency.  A history of mental health issues, including depression and anxiety.  A home environment where drugs and alcohol are easily available to you.  Drug or alcohol use at a young age. SYMPTOMS  The following symptoms can indicate chemical dependency:  Inability to limit the use of drugs or alcohol.  Nausea, sweating, shakiness, and anxiety that occurs when alcohol or drugs are not being used.  An increase in amount of drugs or alcohol that is necessary to get drunk or high. People who experience these symptoms can assess their use of drugs and alcohol by asking themselves the following questions:  Have you been told by friends or family that they are worried about your use of alcohol or drugs?  Do friends and family ever tell you about things you did while drinking alcohol or using drugs that you do not remember?  Do you lie about using alcohol or drugs or about the amounts you use?  Do you have difficulty completing daily tasks unless you use alcohol or drugs?  Is the level of your work or school performance lower because of your drug or alcohol use?  Do you get sick from using drugs or alcohol but keep using anyway?  Do you feel uncomfortable in social situations unless you use alcohol or drugs?  Do you use drugs or alcohol to help forget problems? An answer of yes to any of these questions may indicate chemical dependency. Professional evaluation is suggested. Document Released: 12/05/2001 Document Revised: 03/04/2012 Document Reviewed: 02/16/2011 [facility] Patient  Information 2015 [place], [place]. This information is not intended to replace advice given to you by your health care provider. Make sure you discuss any questions you have with your health care provider.  Benzodiazepine Withdrawal  Benzodiazepines are a group of drugs that are prescribed for both short-term and long-term treatment of a variety of medical conditions. For some of these conditions, such as seizures and sudden and severe muscle spasms, they are used only for a few hours or a few days. For other conditions, such as anxiety, sleep problems, or frequent muscle spasms or to help prevent seizures, they are used for an extended period, usually weeks or months. Benzodiazepines work by changing the way your brain functions. Normally, chemicals in your brain called neurotransmitters send messages between your brain cells. The neurotransmitter that benzodiazepines affect is called gamma-aminobutyric acid (GABA). GABA sends out messages that have a calming effect on many of the functions of your brain. Benzodiazepines make these messages stronger and increase this calming effect. Short-term use of benzodiazepines usually does not cause problems when you stop taking the drugs. However, if you take benzodiazepines for a long time, your body can adjust to the drug and require more of it to produce the same effect (drug tolerance). Eventually, you can develop physical dependence on benzodiazepines, which is when you experience negative effects if your dosage of benzodiazepines is reduced or stopped too quickly. These negative effects are called symptoms of withdrawal. SYMPTOMS Symptoms of withdrawal may begin anytime within the first 10 days after you stop taking the benzodiazepine. They can last from several weeks up  to a few months but usually are the worst between the first 10 to 14 days.  The actual symptoms also vary, depending on the type of benzodiazepine you take. Possible symptoms  include:  Anxiety.  Excitability.  Irritability.  Depression.  Mood swings.  Trouble sleeping.  Confusion.  Uncontrollable shaking (tremors).  Muscle weakness.  Seizures. DIAGNOSIS To diagnose benzodiazepine withdrawal, your caregiver will examine you for certain signs, such as:  Rapid heartbeat.  Rapid breathing.  Tremors.  High blood pressure.  Fever.  Mood changes. Your caregiver also may ask the following questions about your use of benzodiazepines:  What type of benzodiazepine did you take?  How much did you take each day?  How long did you take the drug?  When was the last time you took the drug?  Do you take any other drugs?  Have you had alcohol recently?  Have you had a seizure recently?  Have you lost consciousness recently?  Have you had trouble remembering recent events?  Have you had a recent increase in anxiety, irritability, or trouble sleeping? A drug test also may be administered. TREATMENT The treatment for benzodiazepine withdrawal can vary, depending on the type and severity of your symptoms, what type of benzodiazepine you have been taking, and how long you have been taking the benzodiazepine. Sometimes it is necessary for you to be treated in a hospital, especially if you are at risk of seizures.  Often, treatment includes a prescription for a long-acting benzodiazepine, the dosage of which is reduced slowly over a long period. This period could be several weeks or months. Eventually, your dosage will be reduced to a point that you can stop taking the drug, without experiencing withdrawal symptoms. This is called tapered withdrawal. Occasionally, minor symptoms of withdrawal continue for a few days or weeks after you have completed a tapered withdrawal. SEEK IMMEDIATE MEDICAL CARE IF:  You have a seizure.  You develop a craving for drugs or alcohol.  You begin to experience symptoms of withdrawal during your tapered  withdrawal.  You become very confused.  You lose consciousness.  You have trouble breathing.  You think about hurting yourself or someone else. Document Released: 11/30/2011 Document Revised: 03/04/2012 Document Reviewed: 11/30/2011 [facility] Patient Information 2015 [place], [place]. This information is not intended to replace advice given to you by your health care provider. Make sure you discuss any questions you have with your health care provider.  Chronic Pain Chronic pain can be defined as pain that is off and on and lasts for 3-6 months or longer. Many things cause chronic pain, which can make it difficult to make a diagnosis. There are many treatment options available for chronic pain. However, finding a treatment that works well for you may require trying various approaches until the right one is found. Many people benefit from a combination of two or more types of treatment to control their pain. SYMPTOMS  Chronic pain can occur anywhere in the body and can range from mild to very severe. Some types of chronic pain include:  Headache.  Low back pain.  Cancer pain.  Arthritis pain.  Neurogenic pain. This is pain resulting from damage to nerves. People with chronic pain may also have other symptoms such as:  Depression.  Anger.  Insomnia.  Anxiety. DIAGNOSIS  Your health care provider will help diagnose your condition over time. In many cases, the initial focus will be on excluding possible conditions that could be causing the pain. Depending on your  symptoms, your health care provider may order tests to diagnose your condition. Some of these tests may include:   Blood tests.   CT scan.   MRI.   X-rays.   Ultrasounds.   Nerve conduction studies.  You may need to see a specialist.  TREATMENT  Finding treatment that works well may take time. You may be referred to a pain specialist. He or she may prescribe medicine or therapies, such as:   Mindful  meditation or yoga.  Shots (injections) of numbing or pain-relieving medicines into the spine or area of pain.  Local electrical stimulation.  Acupuncture.   Massage therapy.   Aroma, color, light, or sound therapy.   Biofeedback.   Working with a physical therapist to keep from getting stiff.   Regular, gentle exercise.   Cognitive or behavioral therapy.   Group support.  Sometimes, surgery may be recommended.  HOME CARE INSTRUCTIONS   Take all medicines as directed by your health care provider.   Lessen stress in your life by relaxing and doing things such as listening to calming music.   Exercise or be active as directed by your health care provider.   Eat a healthy diet and include things such as vegetables, fruits, fish, and lean meats in your diet.   Keep all follow-up appointments with your health care provider.   Attend a support group with others suffering from chronic pain. SEEK MEDICAL CARE IF:   Your pain gets worse.   You develop a new pain that was not there before.   You cannot tolerate medicines given to you by your health care provider.   You have new symptoms since your last visit with your health care provider.  SEEK IMMEDIATE MEDICAL CARE IF:   You feel weak.   You have decreased sensation or numbness.   You lose control of bowel or bladder function.   Your pain suddenly gets much worse.   You develop shaking.  You develop chills.  You develop confusion.  You develop chest pain.  You develop shortness of breath.  MAKE SURE YOU:  Understand these instructions.  Will watch your condition.  Will get help right away if you are not doing well or get worse. Document Released: 09/02/2002 Document Revised: 08/13/2013 Document Reviewed: 06/06/2013 [facility] Patient Information 2015 [place], [place]. This information is not intended to replace advice given to you by your health care provider. Make sure you discuss  any questions you have with your health care provider.
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
## 2015-05-25 DIAGNOSIS — F250 Schizoaffective disorder, bipolar type: Secondary | ICD-10-CM | POA: Diagnosis not present

## 2015-06-01 DIAGNOSIS — Z87828 Personal history of other (healed) physical injury and trauma: Secondary | ICD-10-CM | POA: Diagnosis not present

## 2015-06-01 DIAGNOSIS — I10 Essential (primary) hypertension: Secondary | ICD-10-CM | POA: Insufficient documentation

## 2015-06-01 DIAGNOSIS — Z791 Long term (current) use of non-steroidal anti-inflammatories (NSAID): Secondary | ICD-10-CM | POA: Insufficient documentation

## 2015-06-01 DIAGNOSIS — T426X1A Poisoning by other antiepileptic and sedative-hypnotic drugs, accidental (unintentional), initial encounter: Secondary | ICD-10-CM | POA: Diagnosis not present

## 2015-06-01 DIAGNOSIS — M1990 Unspecified osteoarthritis, unspecified site: Secondary | ICD-10-CM | POA: Insufficient documentation

## 2015-06-01 DIAGNOSIS — F419 Anxiety disorder, unspecified: Secondary | ICD-10-CM | POA: Diagnosis not present

## 2015-06-01 DIAGNOSIS — G8929 Other chronic pain: Secondary | ICD-10-CM | POA: Insufficient documentation

## 2015-06-01 DIAGNOSIS — F319 Bipolar disorder, unspecified: Secondary | ICD-10-CM | POA: Insufficient documentation

## 2015-06-01 DIAGNOSIS — Z7951 Long term (current) use of inhaled steroids: Secondary | ICD-10-CM | POA: Diagnosis not present

## 2015-06-01 DIAGNOSIS — Z720 Tobacco use: Secondary | ICD-10-CM | POA: Diagnosis not present

## 2015-06-01 DIAGNOSIS — F209 Schizophrenia, unspecified: Secondary | ICD-10-CM | POA: Insufficient documentation

## 2015-06-01 DIAGNOSIS — M109 Gout, unspecified: Secondary | ICD-10-CM | POA: Diagnosis not present

## 2015-06-01 DIAGNOSIS — F250 Schizoaffective disorder, bipolar type: Secondary | ICD-10-CM | POA: Diagnosis not present

## 2015-06-01 DIAGNOSIS — Z79899 Other long term (current) drug therapy: Secondary | ICD-10-CM | POA: Diagnosis not present

## 2015-06-01 DIAGNOSIS — M25519 Pain in unspecified shoulder: Secondary | ICD-10-CM | POA: Diagnosis not present

## 2015-06-01 NOTE — ED Notes (Signed)
Per  [facility] pt has hx of HTN and has been taking xanax and percocet for 19 years  Pt is out of his medication and states his dr is out of the country  Pt is c/o aching all over, shoulder pain, and foot pain bilaterally  Pt has hx of gout

## 2015-06-02 ENCOUNTER — Emergency Department (HOSPITAL_COMMUNITY)
Admission: EM | Admit: 2015-06-02 | Discharge: 2015-06-02 | Disposition: A | Discharge status: Home / Self Care | Payer: Medicare Other | Attending: Emergency Medicine | Admitting: Emergency Medicine

## 2015-06-02 DIAGNOSIS — G8929 Other chronic pain: Secondary | ICD-10-CM

## 2015-06-02 MED ORDER — CHLORDIAZEPOXIDE HCL 25 MG PO CAPS: 25.0000 mg | ORAL_CAPSULE | Freq: Three times a day (TID) | ORAL | Status: DC | PRN

## 2015-06-02 MED ORDER — CHLORDIAZEPOXIDE HCL 25 MG PO CAPS
25.0000 mg | ORAL_CAPSULE | Freq: Once | ORAL | Status: AC
  Administered 2015-06-02: 25 mg via ORAL
  Filled 2015-06-02: qty 1

## 2015-06-02 MED ORDER — OXYCODONE-ACETAMINOPHEN 5-325 MG PO TABS
1.0000 | ORAL_TABLET | Freq: Once | ORAL | Status: AC
  Administered 2015-06-02: 1 via ORAL
  Filled 2015-06-02: qty 1

## 2015-06-02 NOTE — Discharge Instructions (Signed)
Chronic Pain Jacob [name], you were seen today for chronic pain.  See your primary doctor within 3 days for management.  We do not prescribe narcotics and xanax for chronic pain.  If symptoms worsen, come back to the ED immediately.  Thank you. Chronic pain can be defined as pain that is off and on and lasts for 3-6 months or longer. Many things cause chronic pain, which can make it difficult to make a diagnosis. There are many treatment options available for chronic pain. However, finding a treatment that works well for you may require trying various approaches until the right one is found. Many people benefit from a combination of two or more types of treatment to control their pain. SYMPTOMS  Chronic pain can occur anywhere in the body and can range from mild to very severe. Some types of chronic pain include:  Headache.  Low back pain.  Cancer pain.  Arthritis pain.  Neurogenic pain. This is pain resulting from damage to nerves. People with chronic pain may also have other symptoms such as:  Depression.  Anger.  Insomnia.  Anxiety. DIAGNOSIS  Your health care provider will help diagnose your condition over time. In many cases, the initial focus will be on excluding possible conditions that could be causing the pain. Depending on your symptoms, your health care provider may order tests to diagnose your condition. Some of these tests may include:   Blood tests.   CT scan.   MRI.   X-rays.   Ultrasounds.   Nerve conduction studies.  You may need to see a specialist.  TREATMENT  Finding treatment that works well may take time. You may be referred to a pain specialist. He or she may prescribe medicine or therapies, such as:   Mindful meditation or yoga.  Shots (injections) of numbing or pain-relieving medicines into the spine or area of pain.  Local electrical stimulation.  Acupuncture.   Massage therapy.   Aroma, color, light, or sound therapy.    Biofeedback.   Working with a physical therapist to keep from getting stiff.   Regular, gentle exercise.   Cognitive or behavioral therapy.   Group support.  Sometimes, surgery may be recommended.  HOME CARE INSTRUCTIONS   Take all medicines as directed by your health care provider.   Lessen stress in your life by relaxing and doing things such as listening to calming music.   Exercise or be active as directed by your health care provider.   Eat a healthy diet and include things such as vegetables, fruits, fish, and lean meats in your diet.   Keep all follow-up appointments with your health care provider.   Attend a support group with others suffering from chronic pain. SEEK MEDICAL CARE IF:   Your pain gets worse.   You develop a new pain that was not there before.   You cannot tolerate medicines given to you by your health care provider.   You have new symptoms since your last visit with your health care provider.  SEEK IMMEDIATE MEDICAL CARE IF:   You feel weak.   You have decreased sensation or numbness.   You lose control of bowel or bladder function.   Your pain suddenly gets much worse.   You develop shaking.  You develop chills.  You develop confusion.  You develop chest pain.  You develop shortness of breath.  MAKE SURE YOU:  Understand these instructions.  Will watch your condition.  Will get help right away if you  are not doing well or get worse. Document Released: 09/02/2002 Document Revised: 08/13/2013 Document Reviewed: 06/06/2013 [facility] Patient Information 2015 [place], [place]. This information is not intended to replace advice given to you by your health care provider. Make sure you discuss any questions you have with your health care provider.
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
## 2015-06-02 NOTE — ED Notes (Signed)
Pt ambulating independently w/ steady gait on d/c in no acute distress, A&Ox4. D/c instructions reviewed w/ pt and family - pt and family deny any further questions or concerns at present. Rx given x1  

## 2015-06-09 ENCOUNTER — Encounter (HOSPITAL_COMMUNITY): Payer: Self-pay | Admitting: Emergency Medicine

## 2015-06-09 ENCOUNTER — Emergency Department (HOSPITAL_COMMUNITY)
Admission: EM | Admit: 2015-06-09 | Discharge: 2015-06-09 | Disposition: A | Discharge status: Home / Self Care | Payer: Medicare Other | Attending: Emergency Medicine | Admitting: Emergency Medicine

## 2015-06-09 DIAGNOSIS — F419 Anxiety disorder, unspecified: Secondary | ICD-10-CM | POA: Diagnosis not present

## 2015-06-09 DIAGNOSIS — Z79899 Other long term (current) drug therapy: Secondary | ICD-10-CM | POA: Diagnosis not present

## 2015-06-09 DIAGNOSIS — M25562 Pain in left knee: Secondary | ICD-10-CM | POA: Insufficient documentation

## 2015-06-09 DIAGNOSIS — I10 Essential (primary) hypertension: Secondary | ICD-10-CM | POA: Insufficient documentation

## 2015-06-09 DIAGNOSIS — R195 Other fecal abnormalities: Secondary | ICD-10-CM | POA: Diagnosis not present

## 2015-06-09 DIAGNOSIS — F250 Schizoaffective disorder, bipolar type: Secondary | ICD-10-CM | POA: Diagnosis not present

## 2015-06-09 DIAGNOSIS — R197 Diarrhea, unspecified: Secondary | ICD-10-CM | POA: Insufficient documentation

## 2015-06-09 DIAGNOSIS — Z7951 Long term (current) use of inhaled steroids: Secondary | ICD-10-CM | POA: Insufficient documentation

## 2015-06-09 DIAGNOSIS — F209 Schizophrenia, unspecified: Secondary | ICD-10-CM | POA: Diagnosis not present

## 2015-06-09 DIAGNOSIS — F319 Bipolar disorder, unspecified: Secondary | ICD-10-CM | POA: Diagnosis not present

## 2015-06-09 DIAGNOSIS — Z791 Long term (current) use of non-steroidal anti-inflammatories (NSAID): Secondary | ICD-10-CM | POA: Insufficient documentation

## 2015-06-09 DIAGNOSIS — G8929 Other chronic pain: Secondary | ICD-10-CM

## 2015-06-09 DIAGNOSIS — M1990 Unspecified osteoarthritis, unspecified site: Secondary | ICD-10-CM | POA: Diagnosis not present

## 2015-06-09 DIAGNOSIS — Z720 Tobacco use: Secondary | ICD-10-CM | POA: Insufficient documentation

## 2015-06-09 DIAGNOSIS — M545 Low back pain: Secondary | ICD-10-CM | POA: Insufficient documentation

## 2015-06-09 DIAGNOSIS — M25561 Pain in right knee: Secondary | ICD-10-CM | POA: Diagnosis not present

## 2015-06-09 DIAGNOSIS — Z87440 Personal history of urinary (tract) infections: Secondary | ICD-10-CM | POA: Insufficient documentation

## 2015-06-09 LAB — I-STAT CHEM 8, ED
BUN: 12 mg/dL (ref 6–20)
Calcium, Ion: 1.21 mmol/L (ref 1.12–1.23)
Chloride: 100 mmol/L — ABNORMAL LOW (ref 101–111)
Creatinine, Ser: 0.9 mg/dL (ref 0.61–1.24)
Glucose, Bld: 111 mg/dL — ABNORMAL HIGH (ref 65–99)
HCT: 39 % (ref 39.0–52.0)
Hemoglobin: 13.3 g/dL (ref 13.0–17.0)
Potassium: 3.8 mmol/L (ref 3.5–5.1)
Sodium: 139 mmol/L (ref 135–145)
TCO2: 24 mmol/L (ref 0–100)

## 2015-06-09 LAB — POC OCCULT BLOOD, ED: Fecal Occult Bld: POSITIVE — AB

## 2015-06-09 MED ORDER — PANTOPRAZOLE SODIUM 20 MG PO TBEC: 20.0000 mg | DELAYED_RELEASE_TABLET | Freq: Every day | ORAL | Status: DC

## 2015-06-09 MED ORDER — ACETAMINOPHEN 325 MG PO TABS
650.0000 mg | ORAL_TABLET | Freq: Once | ORAL | Status: AC
  Administered 2015-06-09: 650 mg via ORAL
  Filled 2015-06-09: qty 2
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
## 2015-06-09 NOTE — ED Notes (Signed)
Pt c/o left knee pain that is chronic in nature; pt sts swelling and had fluid drawn off today at orthopedic office; pt sent here for pain management

## 2015-06-09 NOTE — Discharge Instructions (Signed)
Please call your doctor for a followup appointment within 24-48 hours. When you talk to your doctor please let them know that you were seen in the emergency department and have them acquire all of your records so that they can discuss the findings with you and formulate a treatment plan to fully care for your new and ongoing problems. Please follow-up to primary care provider, Dr. Jimmye Norman, to be seen and reassessed this week Please follow-up with Dr. Jimmye Norman regarding trying to find a new pain management specialist Please follow-up with orthopedics Please stop using ibuprofen for this can result and lower GI bleeds Please rest and stay hydrated Please continue to monitor symptoms closely and if symptoms are to worsen or change (fever greater than 101, chills, sweating, nausea, vomiting, chest pain, shortness of breathe, difficulty breathing, weakness, numbness, tingling, worsening or changes to pain pattern, fall, injury, numbness, tingling, red streaks running down the leg, hot to the touch, loss of sensation, inability to control urine or bowel movements) please report back to the Emergency Department immediately.   Chronic Pain Chronic pain can be defined as pain that is off and on and lasts for 3-6 months or longer. Many things cause chronic pain, which can make it difficult to make a diagnosis. There are many treatment options available for chronic pain. However, finding a treatment that works well for you may require trying various approaches until the right one is found. Many people benefit from a combination of two or more types of treatment to control their pain. SYMPTOMS  Chronic pain can occur anywhere in the body and can range from mild to very severe. Some types of chronic pain include:  Headache.  Low back pain.  Cancer pain.  Arthritis pain.  Neurogenic pain. This is pain resulting from damage to nerves. People with chronic pain may also have other symptoms such  as:  Depression.  Anger.  Insomnia.  Anxiety. DIAGNOSIS  Your health care provider will help diagnose your condition over time. In many cases, the initial focus will be on excluding possible conditions that could be causing the pain. Depending on your symptoms, your health care provider may order tests to diagnose your condition. Some of these tests may include:   Blood tests.   CT scan.   MRI.   X-rays.   Ultrasounds.   Nerve conduction studies.  You may need to see a specialist.  TREATMENT  Finding treatment that works well may take time. You may be referred to a pain specialist. He or she may prescribe medicine or therapies, such as:   Mindful meditation or yoga.  Shots (injections) of numbing or pain-relieving medicines into the spine or area of pain.  Local electrical stimulation.  Acupuncture.   Massage therapy.   Aroma, color, light, or sound therapy.   Biofeedback.   Working with a physical therapist to keep from getting stiff.   Regular, gentle exercise.   Cognitive or behavioral therapy.   Group support.  Sometimes, surgery may be recommended.  HOME CARE INSTRUCTIONS   Take all medicines as directed by your health care provider.   Lessen stress in your life by relaxing and doing things such as listening to calming music.   Exercise or be active as directed by your health care provider.   Eat a healthy diet and include things such as vegetables, fruits, fish, and lean meats in your diet.   Keep all follow-up appointments with your health care provider.   Attend a support group  with others suffering from chronic pain. SEEK MEDICAL CARE IF:   Your pain gets worse.   You develop a new pain that was not there before.   You cannot tolerate medicines given to you by your health care provider.   You have new symptoms since your last visit with your health care provider.  SEEK IMMEDIATE MEDICAL CARE IF:   You feel weak.    You have decreased sensation or numbness.   You lose control of bowel or bladder function.   Your pain suddenly gets much worse.   You develop shaking.  You develop chills.  You develop confusion.  You develop chest pain.  You develop shortness of breath.  MAKE SURE YOU:  Understand these instructions.  Will watch your condition.  Will get help right away if you are not doing well or get worse. Document Released: 09/02/2002 Document Revised: 08/13/2013 Document Reviewed: 06/06/2013 [facility] Patient Information 2015 [place], [place]. This information is not intended to replace advice given to you by your health care provider. Make sure you discuss any questions you have with your health care provider.

## 2015-06-22 DIAGNOSIS — F250 Schizoaffective disorder, bipolar type: Secondary | ICD-10-CM | POA: Diagnosis not present

## 2015-07-08 DIAGNOSIS — F250 Schizoaffective disorder, bipolar type: Secondary | ICD-10-CM | POA: Diagnosis not present

## 2015-07-20 DIAGNOSIS — F250 Schizoaffective disorder, bipolar type: Secondary | ICD-10-CM | POA: Diagnosis not present

## 2015-08-03 DIAGNOSIS — F250 Schizoaffective disorder, bipolar type: Secondary | ICD-10-CM | POA: Diagnosis not present

## 2015-08-16 DIAGNOSIS — M25462 Effusion, left knee: Secondary | ICD-10-CM | POA: Diagnosis not present

## 2015-08-16 DIAGNOSIS — M25461 Effusion, right knee: Secondary | ICD-10-CM | POA: Diagnosis not present

## 2015-08-16 DIAGNOSIS — M1712 Unilateral primary osteoarthritis, left knee: Secondary | ICD-10-CM | POA: Diagnosis not present

## 2015-08-16 DIAGNOSIS — M1711 Unilateral primary osteoarthritis, right knee: Secondary | ICD-10-CM | POA: Diagnosis not present

## 2015-08-18 DIAGNOSIS — F250 Schizoaffective disorder, bipolar type: Secondary | ICD-10-CM | POA: Diagnosis not present

## 2015-08-29 ENCOUNTER — Encounter (HOSPITAL_COMMUNITY): Payer: Self-pay | Admitting: Emergency Medicine

## 2015-08-29 ENCOUNTER — Emergency Department (HOSPITAL_COMMUNITY): Payer: Medicare Other

## 2015-08-29 ENCOUNTER — Emergency Department (HOSPITAL_COMMUNITY)
Admission: EM | Admit: 2015-08-29 | Discharge: 2015-08-29 | Disposition: A | Discharge status: Home / Self Care | Payer: Medicare Other | Attending: Emergency Medicine | Admitting: Emergency Medicine

## 2015-08-29 DIAGNOSIS — I10 Essential (primary) hypertension: Secondary | ICD-10-CM | POA: Diagnosis not present

## 2015-08-29 DIAGNOSIS — M1990 Unspecified osteoarthritis, unspecified site: Secondary | ICD-10-CM | POA: Insufficient documentation

## 2015-08-29 DIAGNOSIS — Z791 Long term (current) use of non-steroidal anti-inflammatories (NSAID): Secondary | ICD-10-CM | POA: Insufficient documentation

## 2015-08-29 DIAGNOSIS — G8929 Other chronic pain: Secondary | ICD-10-CM | POA: Diagnosis not present

## 2015-08-29 DIAGNOSIS — K209 Esophagitis, unspecified without bleeding: Secondary | ICD-10-CM

## 2015-08-29 DIAGNOSIS — Z720 Tobacco use: Secondary | ICD-10-CM | POA: Insufficient documentation

## 2015-08-29 DIAGNOSIS — R079 Chest pain, unspecified: Secondary | ICD-10-CM | POA: Diagnosis not present

## 2015-08-29 DIAGNOSIS — F419 Anxiety disorder, unspecified: Secondary | ICD-10-CM | POA: Insufficient documentation

## 2015-08-29 DIAGNOSIS — R202 Paresthesia of skin: Secondary | ICD-10-CM | POA: Diagnosis not present

## 2015-08-29 DIAGNOSIS — R000 Tachycardia, unspecified: Secondary | ICD-10-CM | POA: Insufficient documentation

## 2015-08-29 DIAGNOSIS — F319 Bipolar disorder, unspecified: Secondary | ICD-10-CM | POA: Diagnosis not present

## 2015-08-29 DIAGNOSIS — Z79899 Other long term (current) drug therapy: Secondary | ICD-10-CM | POA: Diagnosis not present

## 2015-08-29 DIAGNOSIS — J9811 Atelectasis: Secondary | ICD-10-CM | POA: Diagnosis not present

## 2015-08-29 DIAGNOSIS — M109 Gout, unspecified: Secondary | ICD-10-CM | POA: Diagnosis not present

## 2015-08-29 LAB — COMPREHENSIVE METABOLIC PANEL
ALT: 72 U/L — ABNORMAL HIGH (ref 17–63)
AST: 117 U/L — ABNORMAL HIGH (ref 15–41)
Albumin: 3.4 g/dL — ABNORMAL LOW (ref 3.5–5.0)
Alkaline Phosphatase: 73 U/L (ref 38–126)
Anion gap: 12 (ref 5–15)
BUN: 14 mg/dL (ref 6–20)
CO2: 20 mmol/L — ABNORMAL LOW (ref 22–32)
Calcium: 9.1 mg/dL (ref 8.9–10.3)
Chloride: 102 mmol/L (ref 101–111)
Creatinine, Ser: 0.7 mg/dL (ref 0.61–1.24)
GFR calc Af Amer: >60 mL/min (ref >60)
GFR calc non Af Amer: >60 mL/min (ref >60)
Glucose, Bld: 85 mg/dL (ref 65–99)
Potassium: 4.4 mmol/L (ref 3.5–5.1)
Sodium: 134 mmol/L — ABNORMAL LOW (ref 135–145)
Total Bilirubin: 0.5 mg/dL (ref 0.3–1.2)
Total Protein: 6.1 g/dL — ABNORMAL LOW (ref 6.5–8.1)

## 2015-08-29 LAB — POC OCCULT BLOOD, ED: Fecal Occult Bld: NEGATIVE

## 2015-08-29 LAB — CBC
HCT: 37.3 % — ABNORMAL LOW (ref 39.0–52.0)
Hemoglobin: 12.3 g/dL — ABNORMAL LOW (ref 13.0–17.0)
MCH: 28.3 pg (ref 26.0–34.0)
MCHC: 33 g/dL (ref 30.0–36.0)
MCV: 85.9 fL (ref 78.0–100.0)
Platelets: 308 10*3/uL (ref 150–400)
RBC: 4.34 MIL/uL (ref 4.22–5.81)
RDW: 14.4 % (ref 11.5–15.5)
WBC: 11.3 10*3/uL — ABNORMAL HIGH (ref 4.0–10.5)

## 2015-08-29 LAB — I-STAT TROPONIN, ED: Troponin i, poc: 0.01 ng/mL (ref 0.00–0.08)

## 2015-08-29 LAB — D-DIMER, QUANTITATIVE: D-Dimer, Quant: 0.76 ug/mL-FEU — ABNORMAL HIGH (ref 0.00–0.48)

## 2015-08-29 LAB — LIPASE, BLOOD: Lipase: 14 U/L — ABNORMAL LOW (ref 22–51)

## 2015-08-29 MED ORDER — IOHEXOL 350 MG/ML SOLN
100.0000 mL | Freq: Once | INTRAVENOUS | Status: AC | PRN
  Administered 2015-08-29: 100 mL via INTRAVENOUS

## 2015-08-29 MED ORDER — ACETAMINOPHEN 325 MG PO TABS: 650.0000 mg | ORAL_TABLET | Freq: Once | ORAL | Status: DC

## 2015-08-29 MED ORDER — GI COCKTAIL ~~LOC~~
30.0000 mL | Freq: Once | ORAL | Status: AC
  Administered 2015-08-29: 30 mL via ORAL
  Filled 2015-08-29: qty 30

## 2015-08-29 MED ORDER — PANTOPRAZOLE SODIUM 40 MG PO TBEC
40.0000 mg | DELAYED_RELEASE_TABLET | Freq: Once | ORAL | Status: AC
  Administered 2015-08-29: 40 mg via ORAL
  Filled 2015-08-29: qty 1

## 2015-08-29 NOTE — ED Notes (Signed)
Pt returned to CT. 

## 2015-08-29 NOTE — ED Notes (Signed)
Pt states "my gout has got flared up now and i got cramps in my legs".

## 2015-08-29 NOTE — ED Notes (Signed)
Pt off unit with CT 

## 2015-08-29 NOTE — ED Notes (Signed)
Pt removed himself from monitor and is ambulatory in room. Pt reporting cramps in legs. MD aware. Pt given heat packs for comfort.

## 2015-08-29 NOTE — ED Notes (Signed)
Per EMS:  Called out for cp, substernal, described as a chicken bone stuck in his throat (did eat chicken yesterday but pain started when he woke up yesterday prior to ingestion of any chicken).  Patient is tachycardic, hx of htn, has not had medications in 30 days or so, psych hx and receives injections of Respiradone every other week.  Denies drug use or etoh use but has a bottle of Listerine in his pocket.  Received 324 asa and one 0.4 mg sl ntg.  Hx of gerd, has not taken meds for that either.  18 gauge left ac placed prior to arrival.
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
## 2015-08-29 NOTE — Discharge Instructions (Signed)
Esophagitis Take Prilosec OTC as directed. Call the [facility] gastroenterology office in 2 days to schedule the next available appointment. Ask Dr. Jimmye Norman to help you to stop smoking and drinking alcohol as tobacco and alcohol contribute to this condition. Esophagitis is inflammation of the esophagus. It can involve swelling, soreness, and pain in the esophagus. This condition can make it difficult and painful to swallow. CAUSES  Most causes of esophagitis are not serious. Many different factors can cause esophagitis, including:  Gastroesophageal reflux disease (GERD). This is when acid from your stomach flows up into the esophagus.  Recurrent vomiting.  An allergic-type reaction.  Certain medicines, especially those that come in large pills.  Ingestion of harmful chemicals, such as household cleaning products.  Heavy alcohol use.  An infection of the esophagus.  Radiation treatment for cancer.  Certain diseases such as sarcoidosis, Crohn's disease, and scleroderma. These diseases may cause recurrent esophagitis. SYMPTOMS   Trouble swallowing.  Painful swallowing.  Chest pain.  Difficulty breathing.  Nausea.  Vomiting.  Abdominal pain. DIAGNOSIS  Your caregiver will take your history and do a physical exam. Depending upon what your caregiver finds, certain tests may also be done, including:  Barium X-ray. You will drink a solution that coats the esophagus, and X-rays will be taken.  Endoscopy. A lighted tube is put down the esophagus so your caregiver can examine the area.  Allergy tests. These can sometimes be arranged through follow-up visits. TREATMENT  Treatment will depend on the cause of your esophagitis. In some cases, steroids or other medicines may be given to help relieve your symptoms or to treat the underlying cause of your condition. Medicines that may be recommended include:  Viscous lidocaine, to soothe the esophagus.  Antacids.  Acid  reducers.  Proton pump inhibitors.  Antiviral medicines for certain viral infections of the esophagus.  Antifungal medicines for certain fungal infections of the esophagus.  Antibiotic medicines, depending on the cause of the esophagitis. HOME CARE INSTRUCTIONS   Avoid foods and drinks that seem to make your symptoms worse.  Eat small, frequent meals instead of large meals.  Avoid eating for the 3 hours prior to your bedtime.  If you have trouble taking pills, use a pill splitter to decrease the size and likelihood of the pill getting stuck or injuring the esophagus on the way down. Drinking water after taking a pill also helps.  Stop smoking if you smoke.  Maintain a healthy weight.  Wear loose-fitting clothing. Do not wear anything tight around your waist that causes pressure on your stomach.  Raise the head of your bed 6 to 8 inches with wood blocks to help you sleep. Extra pillows will not help.  Only take over-the-counter or prescription medicines as directed by your caregiver. SEEK IMMEDIATE MEDICAL CARE IF:  You have severe chest pain that radiates into your arm, neck, or jaw.  You feel sweaty, dizzy, or lightheaded.  You have shortness of breath.  You vomit blood.  You have difficulty or pain with swallowing.  You have bloody or black, tarry stools.  You have a fever.  You have a burning sensation in the chest more than 3 times a week for more than 2 weeks.  You cannot swallow, drink, or eat.  You drool because you cannot swallow your saliva. MAKE SURE YOU:  Understand these instructions.  Will watch your condition.  Will get help right away if you are not doing well or get worse. Document Released: 01/18/2005 Document Revised:  03/04/2012 Document Reviewed: 08/11/2011 Exit[employer] Patient Information 2015 [place], [address]. This information is not intended to replace advice given to you by your health care provider. Make sure you discuss any questions you  have with your health care provider.

## 2015-09-02 DIAGNOSIS — F250 Schizoaffective disorder, bipolar type: Secondary | ICD-10-CM | POA: Diagnosis not present

## 2015-09-21 DIAGNOSIS — F250 Schizoaffective disorder, bipolar type: Secondary | ICD-10-CM | POA: Diagnosis not present

## 2015-10-12 DIAGNOSIS — F250 Schizoaffective disorder, bipolar type: Secondary | ICD-10-CM | POA: Diagnosis not present

## 2015-11-04 DIAGNOSIS — F250 Schizoaffective disorder, bipolar type: Secondary | ICD-10-CM | POA: Diagnosis not present

## 2015-11-23 DIAGNOSIS — F250 Schizoaffective disorder, bipolar type: Secondary | ICD-10-CM | POA: Diagnosis not present

## 2015-12-10 DIAGNOSIS — F250 Schizoaffective disorder, bipolar type: Secondary | ICD-10-CM | POA: Diagnosis not present

## 2015-12-17 ENCOUNTER — Encounter (HOSPITAL_COMMUNITY): Payer: Self-pay | Admitting: *Deleted

## 2015-12-17 ENCOUNTER — Emergency Department (HOSPITAL_COMMUNITY)
Admission: EM | Admit: 2015-12-17 | Discharge: 2015-12-17 | Disposition: A | Discharge status: Home / Self Care | Payer: Medicare Other | Attending: Emergency Medicine | Admitting: Emergency Medicine

## 2015-12-17 DIAGNOSIS — R109 Unspecified abdominal pain: Secondary | ICD-10-CM | POA: Insufficient documentation

## 2015-12-17 DIAGNOSIS — G8929 Other chronic pain: Secondary | ICD-10-CM | POA: Diagnosis not present

## 2015-12-17 DIAGNOSIS — M79674 Pain in right toe(s): Secondary | ICD-10-CM | POA: Diagnosis not present

## 2015-12-17 DIAGNOSIS — M79622 Pain in left upper arm: Secondary | ICD-10-CM | POA: Diagnosis not present

## 2015-12-17 DIAGNOSIS — R197 Diarrhea, unspecified: Secondary | ICD-10-CM

## 2015-12-17 DIAGNOSIS — Z79899 Other long term (current) drug therapy: Secondary | ICD-10-CM | POA: Insufficient documentation

## 2015-12-17 DIAGNOSIS — F419 Anxiety disorder, unspecified: Secondary | ICD-10-CM | POA: Diagnosis not present

## 2015-12-17 DIAGNOSIS — Z87828 Personal history of other (healed) physical injury and trauma: Secondary | ICD-10-CM | POA: Diagnosis not present

## 2015-12-17 DIAGNOSIS — I10 Essential (primary) hypertension: Secondary | ICD-10-CM | POA: Insufficient documentation

## 2015-12-17 DIAGNOSIS — F319 Bipolar disorder, unspecified: Secondary | ICD-10-CM | POA: Insufficient documentation

## 2015-12-17 DIAGNOSIS — F209 Schizophrenia, unspecified: Secondary | ICD-10-CM | POA: Insufficient documentation

## 2015-12-17 DIAGNOSIS — F17210 Nicotine dependence, cigarettes, uncomplicated: Secondary | ICD-10-CM | POA: Diagnosis not present

## 2015-12-17 LAB — CBC
HCT: 41.3 % (ref 39.0–52.0)
Hemoglobin: 13.2 g/dL (ref 13.0–17.0)
MCH: 28.1 pg (ref 26.0–34.0)
MCHC: 32 g/dL (ref 30.0–36.0)
MCV: 88.1 fL (ref 78.0–100.0)
Platelets: 356 10*3/uL (ref 150–400)
RBC: 4.69 MIL/uL (ref 4.22–5.81)
RDW: 14.6 % (ref 11.5–15.5)
WBC: 10 10*3/uL (ref 4.0–10.5)

## 2015-12-17 LAB — URINALYSIS, ROUTINE W REFLEX MICROSCOPIC
Bilirubin Urine: NEGATIVE
Glucose, UA: NEGATIVE mg/dL
Hgb urine dipstick: NEGATIVE
Ketones, ur: NEGATIVE mg/dL
Leukocytes, UA: NEGATIVE
Nitrite: NEGATIVE
Protein, ur: NEGATIVE mg/dL
Specific Gravity, Urine: 1.022 (ref 1.005–1.030)
pH: 6 (ref 5.0–8.0)

## 2015-12-17 LAB — COMPREHENSIVE METABOLIC PANEL
ALT: 37 U/L (ref 17–63)
AST: 24 U/L (ref 15–41)
Albumin: 3.5 g/dL (ref 3.5–5.0)
Alkaline Phosphatase: 66 U/L (ref 38–126)
Anion gap: 10 (ref 5–15)
BUN: 12 mg/dL (ref 6–20)
CO2: 21 mmol/L — ABNORMAL LOW (ref 22–32)
Calcium: 8.9 mg/dL (ref 8.9–10.3)
Chloride: 111 mmol/L (ref 101–111)
Creatinine, Ser: 0.71 mg/dL (ref 0.61–1.24)
GFR calc Af Amer: >60 mL/min (ref >60)
GFR calc non Af Amer: >60 mL/min (ref >60)
Glucose, Bld: 88 mg/dL (ref 65–99)
Potassium: 3.6 mmol/L (ref 3.5–5.1)
Sodium: 142 mmol/L (ref 135–145)
Total Bilirubin: 0.2 mg/dL — ABNORMAL LOW (ref 0.3–1.2)
Total Protein: 6.2 g/dL — ABNORMAL LOW (ref 6.5–8.1)

## 2015-12-17 LAB — LIPASE, BLOOD: Lipase: 23 U/L (ref 11–51)

## 2015-12-17 LAB — POC OCCULT BLOOD, ED: Fecal Occult Bld: NEGATIVE

## 2015-12-17 MED ORDER — IBUPROFEN 800 MG PO TABS
800.0000 mg | ORAL_TABLET | Freq: Once | ORAL | Status: AC
  Administered 2015-12-17: 800 mg via ORAL
  Filled 2015-12-17: qty 1

## 2015-12-17 MED ORDER — LOPERAMIDE HCL 2 MG PO CAPS: 2.0000 mg | ORAL_CAPSULE | Freq: Four times a day (QID) | ORAL | Status: DC | PRN
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
## 2015-12-17 NOTE — Discharge Instructions (Signed)
Continue taking naprosyn as prescribed by your doctor. Follow-up with your primary care physician next week. Return to the ED for new or worsening symptoms.

## 2015-12-17 NOTE — ED Notes (Signed)
Pt. Having numerous symptoms, Reports having n/v/d for 3 days due to pt. Eating donated food.  He also reports having gout in his Rt. Big toe.  He also reports having lt. Numbness to his arm when he is tense.   He stated, "When I stand at ease, the numbness stops."  Pt. Is getting in a gown

## 2015-12-22 DIAGNOSIS — F250 Schizoaffective disorder, bipolar type: Secondary | ICD-10-CM | POA: Diagnosis not present

## 2015-12-23 DIAGNOSIS — F250 Schizoaffective disorder, bipolar type: Secondary | ICD-10-CM | POA: Diagnosis not present

## 2015-12-26 DIAGNOSIS — I639 Cerebral infarction, unspecified: Secondary | ICD-10-CM

## 2015-12-26 DIAGNOSIS — G709 Myoneural disorder, unspecified: Secondary | ICD-10-CM

## 2015-12-26 DIAGNOSIS — S0990XA Unspecified injury of head, initial encounter: Secondary | ICD-10-CM

## 2015-12-26 HISTORY — DX: Myoneural disorder, unspecified: G70.9

## 2015-12-26 HISTORY — DX: Cerebral infarction, unspecified: I63.9

## 2015-12-26 HISTORY — DX: Unspecified injury of head, initial encounter: S09.90XA

## 2016-01-12 DIAGNOSIS — F250 Schizoaffective disorder, bipolar type: Secondary | ICD-10-CM | POA: Diagnosis not present

## 2016-01-27 DIAGNOSIS — F250 Schizoaffective disorder, bipolar type: Secondary | ICD-10-CM | POA: Diagnosis not present

## 2016-02-10 DIAGNOSIS — F250 Schizoaffective disorder, bipolar type: Secondary | ICD-10-CM | POA: Diagnosis not present

## 2016-02-23 DIAGNOSIS — F250 Schizoaffective disorder, bipolar type: Secondary | ICD-10-CM | POA: Diagnosis not present

## 2016-03-08 DIAGNOSIS — F250 Schizoaffective disorder, bipolar type: Secondary | ICD-10-CM | POA: Diagnosis not present

## 2016-03-24 DIAGNOSIS — F250 Schizoaffective disorder, bipolar type: Secondary | ICD-10-CM | POA: Diagnosis not present

## 2016-03-25 ENCOUNTER — Encounter (HOSPITAL_COMMUNITY): Payer: Self-pay | Admitting: Nurse Practitioner

## 2016-03-25 ENCOUNTER — Emergency Department (HOSPITAL_COMMUNITY)
Admission: EM | Admit: 2016-03-25 | Discharge: 2016-03-25 | Disposition: A | Discharge status: Home / Self Care | Payer: Medicare Other | Attending: Emergency Medicine | Admitting: Emergency Medicine

## 2016-03-25 DIAGNOSIS — M25561 Pain in right knee: Secondary | ICD-10-CM | POA: Diagnosis not present

## 2016-03-25 DIAGNOSIS — Z791 Long term (current) use of non-steroidal anti-inflammatories (NSAID): Secondary | ICD-10-CM | POA: Insufficient documentation

## 2016-03-25 DIAGNOSIS — G8929 Other chronic pain: Secondary | ICD-10-CM | POA: Insufficient documentation

## 2016-03-25 DIAGNOSIS — M25511 Pain in right shoulder: Secondary | ICD-10-CM | POA: Insufficient documentation

## 2016-03-25 DIAGNOSIS — M25512 Pain in left shoulder: Secondary | ICD-10-CM | POA: Diagnosis not present

## 2016-03-25 DIAGNOSIS — F17210 Nicotine dependence, cigarettes, uncomplicated: Secondary | ICD-10-CM | POA: Insufficient documentation

## 2016-03-25 DIAGNOSIS — I10 Essential (primary) hypertension: Secondary | ICD-10-CM | POA: Diagnosis not present

## 2016-03-25 DIAGNOSIS — F419 Anxiety disorder, unspecified: Secondary | ICD-10-CM | POA: Insufficient documentation

## 2016-03-25 DIAGNOSIS — M25562 Pain in left knee: Secondary | ICD-10-CM | POA: Diagnosis not present

## 2016-03-25 DIAGNOSIS — Z79899 Other long term (current) drug therapy: Secondary | ICD-10-CM | POA: Diagnosis not present

## 2016-03-25 DIAGNOSIS — F209 Schizophrenia, unspecified: Secondary | ICD-10-CM | POA: Insufficient documentation

## 2016-03-25 DIAGNOSIS — F319 Bipolar disorder, unspecified: Secondary | ICD-10-CM | POA: Insufficient documentation

## 2016-03-25 DIAGNOSIS — Z87828 Personal history of other (healed) physical injury and trauma: Secondary | ICD-10-CM | POA: Diagnosis not present

## 2016-03-25 DIAGNOSIS — M2550 Pain in unspecified joint: Secondary | ICD-10-CM

## 2016-03-25 DIAGNOSIS — M109 Gout, unspecified: Secondary | ICD-10-CM | POA: Diagnosis present

## 2016-03-25 MED ORDER — LIDOCAINE 5 % EX PTCH: 1.0000 | MEDICATED_PATCH | CUTANEOUS | Status: DC

## 2016-03-25 MED ORDER — HYDROCODONE-ACETAMINOPHEN 5-325 MG PO TABS
1.0000 | ORAL_TABLET | Freq: Once | ORAL | Status: AC
  Administered 2016-03-25: 1 via ORAL
  Filled 2016-03-25: qty 1

## 2016-03-25 NOTE — Discharge Instructions (Signed)

## 2016-03-25 NOTE — ED Notes (Addendum)
He c/o chronic lower back, L shoulder pain that is worse today. He also c/o gout pain in bilateral great toes. He tried ibuprofen at home which initially relieved the pain but pain returns
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
## 2016-04-12 DIAGNOSIS — R079 Chest pain, unspecified: Secondary | ICD-10-CM | POA: Diagnosis not present

## 2016-04-15 DIAGNOSIS — F1420 Cocaine dependence, uncomplicated: Secondary | ICD-10-CM | POA: Insufficient documentation

## 2016-04-15 DIAGNOSIS — F259 Schizoaffective disorder, unspecified: Secondary | ICD-10-CM | POA: Insufficient documentation

## 2016-04-17 DIAGNOSIS — I10 Essential (primary) hypertension: Secondary | ICD-10-CM | POA: Diagnosis not present

## 2016-04-17 DIAGNOSIS — F259 Schizoaffective disorder, unspecified: Secondary | ICD-10-CM | POA: Diagnosis not present

## 2016-04-18 DIAGNOSIS — F259 Schizoaffective disorder, unspecified: Secondary | ICD-10-CM | POA: Diagnosis not present

## 2016-04-19 DIAGNOSIS — F250 Schizoaffective disorder, bipolar type: Secondary | ICD-10-CM | POA: Diagnosis not present

## 2016-04-26 DIAGNOSIS — F250 Schizoaffective disorder, bipolar type: Secondary | ICD-10-CM | POA: Diagnosis not present

## 2016-04-28 ENCOUNTER — Encounter (HOSPITAL_COMMUNITY): Payer: Self-pay | Admitting: Emergency Medicine

## 2016-04-28 ENCOUNTER — Emergency Department (HOSPITAL_COMMUNITY)
Admission: EM | Admit: 2016-04-28 | Discharge: 2016-04-28 | Disposition: A | Discharge status: Home / Self Care | Payer: Medicare Other | Attending: Emergency Medicine | Admitting: Emergency Medicine

## 2016-04-28 ENCOUNTER — Emergency Department (HOSPITAL_COMMUNITY): Payer: Medicare Other

## 2016-04-28 DIAGNOSIS — M25531 Pain in right wrist: Secondary | ICD-10-CM

## 2016-04-28 DIAGNOSIS — F17210 Nicotine dependence, cigarettes, uncomplicated: Secondary | ICD-10-CM | POA: Insufficient documentation

## 2016-04-28 DIAGNOSIS — F209 Schizophrenia, unspecified: Secondary | ICD-10-CM | POA: Diagnosis not present

## 2016-04-28 DIAGNOSIS — Z791 Long term (current) use of non-steroidal anti-inflammatories (NSAID): Secondary | ICD-10-CM | POA: Diagnosis not present

## 2016-04-28 DIAGNOSIS — M109 Gout, unspecified: Secondary | ICD-10-CM | POA: Insufficient documentation

## 2016-04-28 DIAGNOSIS — I10 Essential (primary) hypertension: Secondary | ICD-10-CM | POA: Insufficient documentation

## 2016-04-28 DIAGNOSIS — G8929 Other chronic pain: Secondary | ICD-10-CM | POA: Diagnosis not present

## 2016-04-28 DIAGNOSIS — F419 Anxiety disorder, unspecified: Secondary | ICD-10-CM | POA: Diagnosis not present

## 2016-04-28 DIAGNOSIS — M1990 Unspecified osteoarthritis, unspecified site: Secondary | ICD-10-CM | POA: Insufficient documentation

## 2016-04-28 DIAGNOSIS — S6991XA Unspecified injury of right wrist, hand and finger(s), initial encounter: Secondary | ICD-10-CM | POA: Insufficient documentation

## 2016-04-28 DIAGNOSIS — M2550 Pain in unspecified joint: Secondary | ICD-10-CM

## 2016-04-28 DIAGNOSIS — Y9289 Other specified places as the place of occurrence of the external cause: Secondary | ICD-10-CM | POA: Insufficient documentation

## 2016-04-28 DIAGNOSIS — Y998 Other external cause status: Secondary | ICD-10-CM | POA: Diagnosis not present

## 2016-04-28 DIAGNOSIS — F319 Bipolar disorder, unspecified: Secondary | ICD-10-CM | POA: Insufficient documentation

## 2016-04-28 DIAGNOSIS — Z79899 Other long term (current) drug therapy: Secondary | ICD-10-CM | POA: Insufficient documentation

## 2016-04-28 DIAGNOSIS — Y9389 Activity, other specified: Secondary | ICD-10-CM | POA: Diagnosis not present

## 2016-04-28 MED ORDER — TRAMADOL HCL 50 MG PO TABS
50.0000 mg | ORAL_TABLET | Freq: Once | ORAL | Status: AC
  Administered 2016-04-28: 50 mg via ORAL
  Filled 2016-04-28: qty 1

## 2016-04-28 MED ORDER — INDOMETHACIN 50 MG PO CAPS: 50.0000 mg | ORAL_CAPSULE | Freq: Two times a day (BID) | ORAL | Status: DC
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
## 2016-04-28 NOTE — Discharge Instructions (Signed)
Chronic Pain  Chronic pain can be defined as pain that is off and on and lasts for 3-6 months or longer. Many things cause chronic pain, which can make it difficult to make a diagnosis. There are many treatment options available for chronic pain. However, finding a treatment that works well for you may require trying various approaches until the right one is found. Many people benefit from a combination of two or more types of treatment to control their pain.  SYMPTOMS   Chronic pain can occur anywhere in the body and can range from mild to very severe. Some types of chronic pain include:  · Headache.  · Low back pain.  · Cancer pain.  · Arthritis pain.  · Neurogenic pain. This is pain resulting from damage to nerves.   People with chronic pain may also have other symptoms such as:  · Depression.  · Anger.  · Insomnia.  · Anxiety.  DIAGNOSIS   Your health care provider will help diagnose your condition over time. In many cases, the initial focus will be on excluding possible conditions that could be causing the pain. Depending on your symptoms, your health care provider may order tests to diagnose your condition. Some of these tests may include:   · Blood tests.    · CT scan.    · MRI.    · X-rays.    · Ultrasounds.    · Nerve conduction studies.    You may need to see a specialist.   TREATMENT   Finding treatment that works well may take time. You may be referred to a pain specialist. He or she may prescribe medicine or therapies, such as:   · Mindful meditation or yoga.  · Shots (injections) of numbing or pain-relieving medicines into the spine or area of pain.  · Local electrical stimulation.  · Acupuncture.    · Massage therapy.    · Aroma, color, light, or sound therapy.    · Biofeedback.    · Working with a physical therapist to keep from getting stiff.    · Regular, gentle exercise.    · Cognitive or behavioral therapy.    · Group support.    Sometimes, surgery may be recommended.   HOME CARE INSTRUCTIONS    · Take all medicines as directed by your health care provider.    · Lessen stress in your life by relaxing and doing things such as listening to calming music.    · Exercise or be active as directed by your health care provider.    · Eat a healthy diet and include things such as vegetables, fruits, fish, and lean meats in your diet.    · Keep all follow-up appointments with your health care provider.    · Attend a support group with others suffering from chronic pain.  SEEK MEDICAL CARE IF:   · Your pain gets worse.    · You develop a new pain that was not there before.    · You cannot tolerate medicines given to you by your health care provider.    · You have new symptoms since your last visit with your health care provider.    SEEK IMMEDIATE MEDICAL CARE IF:   · You feel weak.    · You have decreased sensation or numbness.    · You lose control of bowel or bladder function.    · Your pain suddenly gets much worse.    · You develop shaking.  · You develop chills.  · You develop confusion.  · You develop chest pain.  · You develop shortness of breath.    MAKE SURE YOU:  ·   Document Revised: 08/13/2013 Document Reviewed: 06/06/2013 Elsevier Interactive Patient Education 2016 Elsevier Inc. Wrist Pain There are many things that can cause wrist pain. Some common causes include:  An injury to the wrist area, such as a sprain, strain, or fracture.  Overuse of the joint.  A condition that causes increased pressure on a nerve in the wrist (carpal tunnel syndrome).  Wear and tear of the joints that occurs with aging (osteoarthritis).  A variety of other types of  arthritis. Sometimes, the cause of wrist pain is not known. The pain often goes away when you follow your health care provider's instructions for relieving pain at home. If your wrist pain continues, tests may need to be done to diagnose your condition. HOME CARE INSTRUCTIONS Pay attention to any changes in your symptoms. Take these actions to help with your pain:  Rest the wrist area for at least 48 hours or as told by your health care provider.  If directed, apply ice to the injured area:  Put ice in a plastic bag.  Place a towel between your skin and the bag.  Leave the ice on for 20 minutes, 2-3 times per day.  Keep your arm raised (elevated) above the level of your heart while you are sitting or lying down.  If a splint or elastic bandage has been applied, use it as told by your health care provider.  Remove the splint or bandage only as told by your health care provider.  Loosen the splint or bandage if your fingers become numb or have a tingling feeling, or if they turn cold or blue.  Take over-the-counter and prescription medicines only as told by your health care provider.  Keep all follow-up visits as told by your health care provider. This is important. SEEK MEDICAL CARE IF:  Your pain is not helped by treatment.  Your pain gets worse. SEEK IMMEDIATE MEDICAL CARE IF:  Your fingers become swollen.  Your fingers turn white, very red, or cold and blue.  Your fingers are numb or have a tingling feeling.  You have difficulty moving your fingers.   This information is not intended to replace advice given to you by your health care provider. Make sure you discuss any questions you have with your health care provider.   Document Released: 09/20/2005 Document Revised: 09/01/2015 Document Reviewed: 04/28/2015 Elsevier Interactive Patient Education [employer].

## 2016-04-28 NOTE — ED Notes (Signed)
Pt to xray at this time.

## 2016-05-11 DIAGNOSIS — F250 Schizoaffective disorder, bipolar type: Secondary | ICD-10-CM | POA: Diagnosis not present

## 2016-05-19 ENCOUNTER — Emergency Department (HOSPITAL_COMMUNITY): Payer: Medicare Other

## 2016-05-19 ENCOUNTER — Encounter (HOSPITAL_COMMUNITY): Payer: Self-pay | Admitting: Emergency Medicine

## 2016-05-19 ENCOUNTER — Emergency Department (HOSPITAL_COMMUNITY)
Admission: EM | Admit: 2016-05-19 | Discharge: 2016-05-19 | Disposition: A | Discharge status: Home / Self Care | Payer: Medicare Other | Attending: Emergency Medicine | Admitting: Emergency Medicine

## 2016-05-19 DIAGNOSIS — Z79899 Other long term (current) drug therapy: Secondary | ICD-10-CM | POA: Diagnosis not present

## 2016-05-19 DIAGNOSIS — F319 Bipolar disorder, unspecified: Secondary | ICD-10-CM | POA: Diagnosis not present

## 2016-05-19 DIAGNOSIS — S32010A Wedge compression fracture of first lumbar vertebra, initial encounter for closed fracture: Secondary | ICD-10-CM | POA: Diagnosis not present

## 2016-05-19 DIAGNOSIS — S3992XA Unspecified injury of lower back, initial encounter: Secondary | ICD-10-CM | POA: Diagnosis present

## 2016-05-19 DIAGNOSIS — J40 Bronchitis, not specified as acute or chronic: Secondary | ICD-10-CM | POA: Diagnosis not present

## 2016-05-19 DIAGNOSIS — R05 Cough: Secondary | ICD-10-CM | POA: Diagnosis not present

## 2016-05-19 DIAGNOSIS — Y9289 Other specified places as the place of occurrence of the external cause: Secondary | ICD-10-CM | POA: Diagnosis not present

## 2016-05-19 DIAGNOSIS — Y9389 Activity, other specified: Secondary | ICD-10-CM | POA: Diagnosis not present

## 2016-05-19 DIAGNOSIS — N289 Disorder of kidney and ureter, unspecified: Secondary | ICD-10-CM | POA: Diagnosis not present

## 2016-05-19 DIAGNOSIS — E860 Dehydration: Secondary | ICD-10-CM | POA: Insufficient documentation

## 2016-05-19 DIAGNOSIS — Y998 Other external cause status: Secondary | ICD-10-CM | POA: Insufficient documentation

## 2016-05-19 DIAGNOSIS — M1990 Unspecified osteoarthritis, unspecified site: Secondary | ICD-10-CM | POA: Insufficient documentation

## 2016-05-19 DIAGNOSIS — N179 Acute kidney failure, unspecified: Secondary | ICD-10-CM | POA: Diagnosis not present

## 2016-05-19 DIAGNOSIS — F419 Anxiety disorder, unspecified: Secondary | ICD-10-CM | POA: Diagnosis not present

## 2016-05-19 DIAGNOSIS — W010XXA Fall on same level from slipping, tripping and stumbling without subsequent striking against object, initial encounter: Secondary | ICD-10-CM | POA: Diagnosis not present

## 2016-05-19 DIAGNOSIS — G8929 Other chronic pain: Secondary | ICD-10-CM | POA: Insufficient documentation

## 2016-05-19 DIAGNOSIS — F17210 Nicotine dependence, cigarettes, uncomplicated: Secondary | ICD-10-CM | POA: Diagnosis not present

## 2016-05-19 DIAGNOSIS — M25552 Pain in left hip: Secondary | ICD-10-CM | POA: Insufficient documentation

## 2016-05-19 DIAGNOSIS — J209 Acute bronchitis, unspecified: Secondary | ICD-10-CM | POA: Insufficient documentation

## 2016-05-19 DIAGNOSIS — I10 Essential (primary) hypertension: Secondary | ICD-10-CM | POA: Diagnosis not present

## 2016-05-19 DIAGNOSIS — F209 Schizophrenia, unspecified: Secondary | ICD-10-CM | POA: Insufficient documentation

## 2016-05-19 LAB — CBC WITH DIFFERENTIAL/PLATELET
Basophils Absolute: 0 10*3/uL (ref 0.0–0.1)
Basophils Relative: 0 %
Eosinophils Absolute: 0.1 10*3/uL (ref 0.0–0.7)
Eosinophils Relative: 1 %
HCT: 41.5 % (ref 39.0–52.0)
Hemoglobin: 13.3 g/dL (ref 13.0–17.0)
Lymphocytes Relative: 22 %
Lymphs Abs: 2.5 10*3/uL (ref 0.7–4.0)
MCH: 27.4 pg (ref 26.0–34.0)
MCHC: 32 g/dL (ref 30.0–36.0)
MCV: 85.4 fL (ref 78.0–100.0)
Monocytes Absolute: 1.1 10*3/uL — ABNORMAL HIGH (ref 0.1–1.0)
Monocytes Relative: 10 %
Neutro Abs: 7.8 10*3/uL — ABNORMAL HIGH (ref 1.7–7.7)
Neutrophils Relative %: 67 %
Platelets: 299 10*3/uL (ref 150–400)
RBC: 4.86 MIL/uL (ref 4.22–5.81)
RDW: 14.4 % (ref 11.5–15.5)
WBC: 11.5 10*3/uL — ABNORMAL HIGH (ref 4.0–10.5)

## 2016-05-19 LAB — TROPONIN I: Troponin I: <0.03 ng/mL (ref <0.031)

## 2016-05-19 LAB — URINALYSIS, ROUTINE W REFLEX MICROSCOPIC
Bilirubin Urine: NEGATIVE
Glucose, UA: NEGATIVE mg/dL
Hgb urine dipstick: NEGATIVE
Ketones, ur: NEGATIVE mg/dL
Leukocytes, UA: NEGATIVE
Nitrite: NEGATIVE
Protein, ur: NEGATIVE mg/dL
Specific Gravity, Urine: 1.01 (ref 1.005–1.030)
pH: 6 (ref 5.0–8.0)

## 2016-05-19 LAB — CBG MONITORING, ED: Glucose-Capillary: 98 mg/dL (ref 65–99)

## 2016-05-19 LAB — COMPREHENSIVE METABOLIC PANEL
ALT: 28 U/L (ref 17–63)
AST: 24 U/L (ref 15–41)
Albumin: 3.9 g/dL (ref 3.5–5.0)
Alkaline Phosphatase: 63 U/L (ref 38–126)
Anion gap: 13 (ref 5–15)
BUN: 26 mg/dL — ABNORMAL HIGH (ref 6–20)
CO2: 25 mmol/L (ref 22–32)
Calcium: 9.4 mg/dL (ref 8.9–10.3)
Chloride: 94 mmol/L — ABNORMAL LOW (ref 101–111)
Creatinine, Ser: 2.25 mg/dL — ABNORMAL HIGH (ref 0.61–1.24)
GFR calc Af Amer: 38 mL/min — ABNORMAL LOW (ref >60)
GFR calc non Af Amer: 33 mL/min — ABNORMAL LOW (ref >60)
Glucose, Bld: 98 mg/dL (ref 65–99)
Potassium: 3.4 mmol/L — ABNORMAL LOW (ref 3.5–5.1)
Sodium: 132 mmol/L — ABNORMAL LOW (ref 135–145)
Total Bilirubin: 0.6 mg/dL (ref 0.3–1.2)
Total Protein: 7.5 g/dL (ref 6.5–8.1)

## 2016-05-19 MED ORDER — AZITHROMYCIN 250 MG PO TABS
250.0000 mg | ORAL_TABLET | Freq: Every day | ORAL | Status: DC
Start: 2016-05-19 — End: 2016-07-15

## 2016-05-19 MED ORDER — ONDANSETRON HCL 4 MG/2ML IJ SOLN
4.0000 mg | Freq: Once | INTRAMUSCULAR | Status: AC
  Administered 2016-05-19: 4 mg via INTRAVENOUS
  Filled 2016-05-19: qty 2

## 2016-05-19 MED ORDER — SODIUM CHLORIDE 0.9 % IV BOLUS (SEPSIS)
1000.0000 mL | Freq: Once | INTRAVENOUS | Status: AC
  Administered 2016-05-19: 1000 mL via INTRAVENOUS

## 2016-05-19 MED ORDER — HYDROCODONE-ACETAMINOPHEN 5-325 MG PO TABS: 1.0000 | ORAL_TABLET | ORAL | Status: DC | PRN

## 2016-05-19 MED ORDER — MORPHINE SULFATE (PF) 4 MG/ML IV SOLN
4.0000 mg | Freq: Once | INTRAVENOUS | Status: AC
  Administered 2016-05-19: 4 mg via INTRAVENOUS
  Filled 2016-05-19: qty 1

## 2016-05-19 MED ORDER — AZITHROMYCIN 250 MG PO TABS
500.0000 mg | ORAL_TABLET | Freq: Once | ORAL | Status: AC
  Administered 2016-05-19: 500 mg via ORAL
  Filled 2016-05-19: qty 2
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
## 2016-05-19 NOTE — ED Notes (Signed)
EKG completed given to EDP.  
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
## 2016-05-31 ENCOUNTER — Encounter (HOSPITAL_COMMUNITY): Payer: Self-pay | Admitting: *Deleted

## 2016-05-31 DIAGNOSIS — Z792 Long term (current) use of antibiotics: Secondary | ICD-10-CM | POA: Diagnosis not present

## 2016-05-31 DIAGNOSIS — I10 Essential (primary) hypertension: Secondary | ICD-10-CM | POA: Diagnosis not present

## 2016-05-31 DIAGNOSIS — Z79899 Other long term (current) drug therapy: Secondary | ICD-10-CM | POA: Insufficient documentation

## 2016-05-31 DIAGNOSIS — G8929 Other chronic pain: Secondary | ICD-10-CM | POA: Diagnosis not present

## 2016-05-31 DIAGNOSIS — M1990 Unspecified osteoarthritis, unspecified site: Secondary | ICD-10-CM | POA: Diagnosis not present

## 2016-05-31 DIAGNOSIS — J069 Acute upper respiratory infection, unspecified: Secondary | ICD-10-CM | POA: Diagnosis not present

## 2016-05-31 DIAGNOSIS — R05 Cough: Secondary | ICD-10-CM | POA: Diagnosis present

## 2016-05-31 DIAGNOSIS — F17210 Nicotine dependence, cigarettes, uncomplicated: Secondary | ICD-10-CM | POA: Insufficient documentation

## 2016-05-31 DIAGNOSIS — F419 Anxiety disorder, unspecified: Secondary | ICD-10-CM | POA: Insufficient documentation

## 2016-05-31 DIAGNOSIS — M545 Low back pain: Secondary | ICD-10-CM | POA: Diagnosis not present

## 2016-05-31 DIAGNOSIS — Z87828 Personal history of other (healed) physical injury and trauma: Secondary | ICD-10-CM | POA: Diagnosis not present

## 2016-05-31 DIAGNOSIS — R0602 Shortness of breath: Secondary | ICD-10-CM | POA: Diagnosis not present

## 2016-05-31 DIAGNOSIS — F319 Bipolar disorder, unspecified: Secondary | ICD-10-CM | POA: Diagnosis not present

## 2016-05-31 DIAGNOSIS — M109 Gout, unspecified: Secondary | ICD-10-CM | POA: Diagnosis not present

## 2016-05-31 DIAGNOSIS — F250 Schizoaffective disorder, bipolar type: Secondary | ICD-10-CM | POA: Diagnosis not present

## 2016-05-31 NOTE — ED Notes (Signed)
The pt has a cold and cough he also wantas to be seen for back pain   He was here one week ago for the back pain he is still hurting

## 2016-06-01 ENCOUNTER — Emergency Department (HOSPITAL_COMMUNITY): Payer: Medicare Other

## 2016-06-01 ENCOUNTER — Emergency Department (HOSPITAL_COMMUNITY)
Admission: EM | Admit: 2016-06-01 | Discharge: 2016-06-01 | Disposition: A | Discharge status: Home / Self Care | Payer: Medicare Other | Attending: Emergency Medicine | Admitting: Emergency Medicine

## 2016-06-01 DIAGNOSIS — J069 Acute upper respiratory infection, unspecified: Secondary | ICD-10-CM | POA: Diagnosis not present

## 2016-06-01 DIAGNOSIS — R05 Cough: Secondary | ICD-10-CM | POA: Diagnosis not present

## 2016-06-01 DIAGNOSIS — R0602 Shortness of breath: Secondary | ICD-10-CM | POA: Diagnosis not present

## 2016-06-01 MED ORDER — GUAIFENESIN 100 MG/5ML PO SOLN
5.0000 mL | Freq: Once | ORAL | Status: AC
  Administered 2016-06-01: 100 mg via ORAL
  Filled 2016-06-01: qty 5

## 2016-06-01 MED ORDER — GUAIFENESIN 100 MG/5ML PO SYRP: 100.0000 mg | ORAL_SOLUTION | ORAL | Status: DC | PRN
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
## 2016-06-01 NOTE — ED Notes (Signed)
Pt stable, ambulatory, states understanding of discharge instructions 

## 2016-06-01 NOTE — Discharge Instructions (Signed)
Your chest xray is normal  You can safely take tylenol or Ibuprofen for pain  Upper Respiratory Tract Infection  Most upper respiratory infections (URIs) are caused by a virus. A URI affects the nose, throat, and upper air passages. The most common type of URI is often called "the common cold." HOME CARE   Take medicines only as told by your doctor.  Gargle warm saltwater or take cough drops to comfort your throat as told by your doctor.  Use a warm mist humidifier or inhale steam from a shower to increase air moisture. This may make it easier to breathe.  Drink enough fluid to keep your pee (urine) clear or pale yellow.  Eat soups and other clear broths.  Have a healthy diet.  Rest as needed.  Go back to work when your fever is gone or your doctor says it is okay.  You may need to stay home longer to avoid giving your URI to others.  You can also wear a face mask and wash your hands often to prevent spread of the virus.  Use your inhaler more if you have asthma.  Do not use any tobacco products, including cigarettes, chewing tobacco, or electronic cigarettes. If you need help quitting, ask your doctor. GET HELP IF:  You are getting worse, not better.  Your symptoms are not helped by medicine.  You have chills.  You are getting more short of breath.  You have brown or red mucus.  You have yellow or brown discharge from your nose.  You have pain in your face, especially when you bend forward.  You have a fever.  You have puffy (swollen) neck glands.  You have pain while swallowing.  You have white areas in the back of your throat. GET HELP RIGHT AWAY IF:   You have very bad or constant:  Headache.  Ear pain.  Pain in your forehead, behind your eyes, and over your cheekbones (sinus pain).  Chest pain.  You have long-lasting (chronic) lung disease and any of the following:  Wheezing.  Long-lasting cough.  Coughing up blood.  A change in your usual  mucus.  You have a stiff neck.  You have changes in your:  Vision.  Hearing.  Thinking.  Mood. MAKE SURE YOU:   Understand these instructions.  Will watch your condition.  Will get help right away if you are not doing well or get worse.   This information is not intended to replace advice given to you by your health care provider. Make sure you discuss any questions you have with your health care provider.   Document Released: 05/29/2008 Document Revised: 04/27/2015 Document Reviewed: 03/18/2014 Elsevier Interactive Patient Education 2016 Elsevier Inc[employer]  Cough, Adult Coughing is a reflex that clears your throat and your airways. Coughing helps to heal and protect your lungs. It is normal to cough occasionally, but a cough that happens with other symptoms or lasts a long time may be a sign of a condition that needs treatment. A cough may last only 2-3 weeks (acute), or it may last longer than 8 weeks (chronic). CAUSES Coughing is commonly caused by:  Breathing in substances that irritate your lungs.  A viral or bacterial respiratory infection.  Allergies.  Asthma.  Postnasal drip.  Smoking.  Acid backing up from the stomach into the esophagus (gastroesophageal reflux).  Certain medicines.  Chronic lung problems, including COPD (or rarely, lung cancer).  Other medical conditions such as heart failure. HOME CARE INSTRUCTIONS  Pay attention to any changes in your symptoms. Take these actions to help with your discomfort:  Take medicines only as told by your health care provider.  If you were prescribed an antibiotic medicine, take it as told by your health care provider. Do not stop taking the antibiotic even if you start to feel better.  Talk with your health care provider before you take a cough suppressant medicine.  Drink enough fluid to keep your urine clear or pale yellow.  If the air is dry, use a cold steam vaporizer or humidifier in your bedroom or  your home to help loosen secretions.  Avoid anything that causes you to cough at work or at home.  If your cough is worse at night, try sleeping in a semi-upright position.  Avoid cigarette smoke. If you smoke, quit smoking. If you need help quitting, ask your health care provider.  Avoid caffeine.  Avoid alcohol.  Rest as needed. SEEK MEDICAL CARE IF:   You have new symptoms.  You cough up pus.  Your cough does not get better after 2-3 weeks, or your cough gets worse.  You cannot control your cough with suppressant medicines and you are losing sleep.  You develop pain that is getting worse or pain that is not controlled with pain medicines.  You have a fever.  You have unexplained weight loss.  You have night sweats. SEEK IMMEDIATE MEDICAL CARE IF:  You cough up blood.  You have difficulty breathing.  Your heartbeat is very fast.   This information is not intended to replace advice given to you by your health care provider. Make sure you discuss any questions you have with your health care provider.   Document Released: 06/09/2011 Document Revised: 09/01/2015 Document Reviewed: 02/17/2015 Elsevier Interactive Patient Education [employer].

## 2016-06-14 DIAGNOSIS — F250 Schizoaffective disorder, bipolar type: Secondary | ICD-10-CM | POA: Diagnosis not present

## 2016-07-11 DIAGNOSIS — F250 Schizoaffective disorder, bipolar type: Secondary | ICD-10-CM | POA: Diagnosis not present

## 2016-07-15 ENCOUNTER — Encounter (HOSPITAL_COMMUNITY): Payer: Self-pay

## 2016-07-15 ENCOUNTER — Inpatient Hospital Stay (HOSPITAL_COMMUNITY): Payer: Medicare Other

## 2016-07-15 ENCOUNTER — Emergency Department (HOSPITAL_COMMUNITY): Payer: Medicare Other

## 2016-07-15 ENCOUNTER — Inpatient Hospital Stay (HOSPITAL_COMMUNITY)
Admission: EM | Admit: 2016-07-15 | Discharge: 2016-07-17 | DRG: 683 | Disposition: A | Discharge status: Home / Self Care | Payer: Medicare Other | Attending: Internal Medicine | Admitting: Internal Medicine

## 2016-07-15 DIAGNOSIS — N170 Acute kidney failure with tubular necrosis: Secondary | ICD-10-CM | POA: Diagnosis not present

## 2016-07-15 DIAGNOSIS — F1110 Opioid abuse, uncomplicated: Secondary | ICD-10-CM | POA: Diagnosis present

## 2016-07-15 DIAGNOSIS — E860 Dehydration: Secondary | ICD-10-CM | POA: Diagnosis present

## 2016-07-15 DIAGNOSIS — E871 Hypo-osmolality and hyponatremia: Secondary | ICD-10-CM | POA: Diagnosis present

## 2016-07-15 DIAGNOSIS — R0602 Shortness of breath: Secondary | ICD-10-CM | POA: Diagnosis not present

## 2016-07-15 DIAGNOSIS — R112 Nausea with vomiting, unspecified: Secondary | ICD-10-CM | POA: Diagnosis present

## 2016-07-15 DIAGNOSIS — F329 Major depressive disorder, single episode, unspecified: Secondary | ICD-10-CM | POA: Diagnosis present

## 2016-07-15 DIAGNOSIS — N189 Chronic kidney disease, unspecified: Secondary | ICD-10-CM | POA: Diagnosis present

## 2016-07-15 DIAGNOSIS — I129 Hypertensive chronic kidney disease with stage 1 through stage 4 chronic kidney disease, or unspecified chronic kidney disease: Secondary | ICD-10-CM | POA: Diagnosis present

## 2016-07-15 DIAGNOSIS — I10 Essential (primary) hypertension: Secondary | ICD-10-CM | POA: Diagnosis not present

## 2016-07-15 DIAGNOSIS — N179 Acute kidney failure, unspecified: Secondary | ICD-10-CM | POA: Diagnosis not present

## 2016-07-15 DIAGNOSIS — F17210 Nicotine dependence, cigarettes, uncomplicated: Secondary | ICD-10-CM | POA: Diagnosis present

## 2016-07-15 DIAGNOSIS — R1310 Dysphagia, unspecified: Secondary | ICD-10-CM | POA: Diagnosis not present

## 2016-07-15 DIAGNOSIS — R1110 Vomiting, unspecified: Secondary | ICD-10-CM | POA: Diagnosis not present

## 2016-07-15 DIAGNOSIS — M109 Gout, unspecified: Secondary | ICD-10-CM | POA: Diagnosis present

## 2016-07-15 DIAGNOSIS — R55 Syncope and collapse: Secondary | ICD-10-CM | POA: Diagnosis not present

## 2016-07-15 DIAGNOSIS — F419 Anxiety disorder, unspecified: Secondary | ICD-10-CM | POA: Diagnosis present

## 2016-07-15 DIAGNOSIS — R42 Dizziness and giddiness: Secondary | ICD-10-CM | POA: Diagnosis not present

## 2016-07-15 DIAGNOSIS — I959 Hypotension, unspecified: Secondary | ICD-10-CM | POA: Diagnosis present

## 2016-07-15 DIAGNOSIS — F209 Schizophrenia, unspecified: Secondary | ICD-10-CM | POA: Diagnosis present

## 2016-07-15 DIAGNOSIS — R404 Transient alteration of awareness: Secondary | ICD-10-CM | POA: Diagnosis not present

## 2016-07-15 DIAGNOSIS — S8992XA Unspecified injury of left lower leg, initial encounter: Secondary | ICD-10-CM | POA: Diagnosis not present

## 2016-07-15 DIAGNOSIS — R079 Chest pain, unspecified: Secondary | ICD-10-CM | POA: Diagnosis not present

## 2016-07-15 DIAGNOSIS — M25562 Pain in left knee: Secondary | ICD-10-CM | POA: Diagnosis not present

## 2016-07-15 HISTORY — DX: Acute kidney failure, unspecified: N17.9

## 2016-07-15 HISTORY — DX: Dehydration: E86.0

## 2016-07-15 HISTORY — DX: Nausea with vomiting, unspecified: R11.2

## 2016-07-15 LAB — URINALYSIS, ROUTINE W REFLEX MICROSCOPIC
Bilirubin Urine: NEGATIVE
Glucose, UA: NEGATIVE mg/dL
Hgb urine dipstick: NEGATIVE
Ketones, ur: NEGATIVE mg/dL
Leukocytes, UA: NEGATIVE
Nitrite: NEGATIVE
Protein, ur: NEGATIVE mg/dL
Specific Gravity, Urine: 1.013 (ref 1.005–1.030)
pH: 5 (ref 5.0–8.0)

## 2016-07-15 LAB — RAPID URINE DRUG SCREEN, HOSP PERFORMED
Amphetamines: NOT DETECTED
Barbiturates: NOT DETECTED
Benzodiazepines: NOT DETECTED
Cocaine: NOT DETECTED
Opiates: NOT DETECTED
Tetrahydrocannabinol: NOT DETECTED

## 2016-07-15 LAB — CBC
HCT: 37.8 % — ABNORMAL LOW (ref 39.0–52.0)
Hemoglobin: 12.3 g/dL — ABNORMAL LOW (ref 13.0–17.0)
MCH: 27.9 pg (ref 26.0–34.0)
MCHC: 32.5 g/dL (ref 30.0–36.0)
MCV: 85.7 fL (ref 78.0–100.0)
Platelets: 289 10*3/uL (ref 150–400)
RBC: 4.41 MIL/uL (ref 4.22–5.81)
RDW: 13.2 % (ref 11.5–15.5)
WBC: 12 10*3/uL — ABNORMAL HIGH (ref 4.0–10.5)

## 2016-07-15 LAB — BASIC METABOLIC PANEL
Anion gap: 13 (ref 5–15)
BUN: 69 mg/dL — ABNORMAL HIGH (ref 6–20)
CO2: 19 mmol/L — ABNORMAL LOW (ref 22–32)
Calcium: 8.2 mg/dL — ABNORMAL LOW (ref 8.9–10.3)
Chloride: 97 mmol/L — ABNORMAL LOW (ref 101–111)
Creatinine, Ser: 4.67 mg/dL — ABNORMAL HIGH (ref 0.61–1.24)
GFR calc Af Amer: 16 mL/min — ABNORMAL LOW (ref >60)
GFR calc non Af Amer: 13 mL/min — ABNORMAL LOW (ref >60)
Glucose, Bld: 103 mg/dL — ABNORMAL HIGH (ref 65–99)
Potassium: 3.6 mmol/L (ref 3.5–5.1)
Sodium: 129 mmol/L — ABNORMAL LOW (ref 135–145)

## 2016-07-15 LAB — I-STAT TROPONIN, ED: Troponin i, poc: 0.02 ng/mL (ref 0.00–0.08)

## 2016-07-15 LAB — POC OCCULT BLOOD, ED: Fecal Occult Bld: NEGATIVE

## 2016-07-15 LAB — SODIUM, URINE, RANDOM: Sodium, Ur: 47 mmol/L

## 2016-07-15 LAB — LIPASE, BLOOD: Lipase: 40 U/L (ref 11–51)

## 2016-07-15 LAB — TROPONIN I: Troponin I: <0.03 ng/mL (ref <0.03)

## 2016-07-15 LAB — CREATININE, URINE, RANDOM: Creatinine, Urine: 48.63 mg/dL

## 2016-07-15 MED ORDER — AMANTADINE HCL 100 MG PO CAPS
100.0000 mg | ORAL_CAPSULE | Freq: Three times a day (TID) | ORAL | Status: DC
  Administered 2016-07-15 – 2016-07-17 (×5): 100 mg via ORAL
  Filled 2016-07-15 (×7): qty 1

## 2016-07-15 MED ORDER — TRAZODONE HCL 100 MG PO TABS
100.0000 mg | ORAL_TABLET | Freq: Every day | ORAL | Status: DC
  Administered 2016-07-15 – 2016-07-16 (×2): 100 mg via ORAL
  Filled 2016-07-15 (×2): qty 1

## 2016-07-15 MED ORDER — SODIUM CHLORIDE 0.9% FLUSH
3.0000 mL | Freq: Two times a day (BID) | INTRAVENOUS | Status: DC
  Administered 2016-07-15 – 2016-07-16 (×2): 3 mL via INTRAVENOUS

## 2016-07-15 MED ORDER — ONDANSETRON HCL 4 MG PO TABS: 4.0000 mg | ORAL_TABLET | Freq: Four times a day (QID) | ORAL | Status: DC | PRN

## 2016-07-15 MED ORDER — HYDROCODONE-ACETAMINOPHEN 5-325 MG PO TABS
1.0000 | ORAL_TABLET | ORAL | Status: DC | PRN
  Administered 2016-07-15 – 2016-07-17 (×6): 2 via ORAL
  Filled 2016-07-15 (×6): qty 2

## 2016-07-15 MED ORDER — BENZTROPINE MESYLATE 1 MG PO TABS
1.0000 mg | ORAL_TABLET | Freq: Every day | ORAL | Status: DC
  Administered 2016-07-16 – 2016-07-17 (×2): 1 mg via ORAL
  Filled 2016-07-15 (×2): qty 1

## 2016-07-15 MED ORDER — ACETAMINOPHEN 650 MG RE SUPP: 650.0000 mg | Freq: Four times a day (QID) | RECTAL | Status: DC | PRN

## 2016-07-15 MED ORDER — RISPERIDONE 3 MG PO TABS
6.0000 mg | ORAL_TABLET | Freq: Every day | ORAL | Status: DC
  Administered 2016-07-15 – 2016-07-16 (×2): 6 mg via ORAL
  Filled 2016-07-15 (×3): qty 2

## 2016-07-15 MED ORDER — RISPERIDONE 3 MG PO TABS: 3.0000 mg | ORAL_TABLET | ORAL | Status: DC

## 2016-07-15 MED ORDER — ONDANSETRON HCL 4 MG/2ML IJ SOLN: 4.0000 mg | Freq: Four times a day (QID) | INTRAMUSCULAR | Status: DC | PRN

## 2016-07-15 MED ORDER — PNEUMOCOCCAL VAC POLYVALENT 25 MCG/0.5ML IJ INJ: 0.5000 mL | INJECTION | INTRAMUSCULAR | Status: DC | PRN

## 2016-07-15 MED ORDER — FLUOXETINE HCL 20 MG PO CAPS
20.0000 mg | ORAL_CAPSULE | Freq: Every day | ORAL | Status: DC
  Administered 2016-07-16 – 2016-07-17 (×2): 20 mg via ORAL
  Filled 2016-07-15 (×2): qty 1

## 2016-07-15 MED ORDER — SODIUM CHLORIDE 0.9 % IV SOLN
INTRAVENOUS | Status: AC
  Administered 2016-07-15 – 2016-07-16 (×2): via INTRAVENOUS

## 2016-07-15 MED ORDER — ENOXAPARIN SODIUM 30 MG/0.3ML ~~LOC~~ SOLN
30.0000 mg | Freq: Every day | SUBCUTANEOUS | Status: DC
  Administered 2016-07-16: 30 mg via SUBCUTANEOUS
  Filled 2016-07-15: qty 0.3

## 2016-07-15 MED ORDER — FLUOXETINE HCL 20 MG PO CAPS: 20.0000 mg | ORAL_CAPSULE | Freq: Every day | ORAL | Status: DC

## 2016-07-15 MED ORDER — SODIUM CHLORIDE 0.9 % IV BOLUS (SEPSIS)
1000.0000 mL | Freq: Once | INTRAVENOUS | Status: AC
  Administered 2016-07-15: 1000 mL via INTRAVENOUS

## 2016-07-15 MED ORDER — ACETAMINOPHEN 325 MG PO TABS: 650.0000 mg | ORAL_TABLET | Freq: Four times a day (QID) | ORAL | Status: DC | PRN

## 2016-07-15 MED ORDER — PANTOPRAZOLE SODIUM 40 MG PO TBEC
40.0000 mg | DELAYED_RELEASE_TABLET | Freq: Once | ORAL | Status: AC
  Administered 2016-07-15: 40 mg via ORAL
  Filled 2016-07-15: qty 1

## 2016-07-15 MED ORDER — RISPERIDONE 3 MG PO TABS
3.0000 mg | ORAL_TABLET | Freq: Every day | ORAL | Status: DC
  Administered 2016-07-15 – 2016-07-17 (×3): 3 mg via ORAL
  Filled 2016-07-15 (×3): qty 1

## 2016-07-15 NOTE — Progress Notes (Signed)
Admission note:  Arrival Method: Pt arrived on stretcher from ED Mental Orientation: Alert and oriented x 4 Telemetry: Telemetry box 6e04 applied. CCMT notified. Assessment: Completed, see flowsheets Skin: Dry and intact. Bruising noted to left knee.  IV: Left wrist. Site is clean, dry and intact.  Pain: Pt states pain is 7/10 in his mid to lower back. Medication administered, see MAR Tubes: N/A Safety Measures: Bed in lowest position, non-slip socks placed, call light within reach, bed alarm activated.  Fall Prevention Safety Plan: Reviewed with patient Admission Screening: Completed 6700 Orientation: Patient has been oriented to the unit, staff and to the room. Orders have been reviewed and implemented. Call light is within reach, will continue to monitor.   Shelbie Hutching, RN, BSN
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
## 2016-07-15 NOTE — ED Notes (Signed)
Patient was at home with any air conditioning.  Had initial BP of 60/40.  Has had almost whole 1081mL and brought BP to into the 90s.  Patient states he ran out of psych meds 2 days ago.  Patient A&Ox4

## 2016-07-15 NOTE — ED Notes (Signed)
Dr Roel Cluck ordered to give pt sips of water to see if he can keep it down.

## 2016-07-16 DIAGNOSIS — F209 Schizophrenia, unspecified: Secondary | ICD-10-CM

## 2016-07-16 DIAGNOSIS — N170 Acute kidney failure with tubular necrosis: Secondary | ICD-10-CM

## 2016-07-16 DIAGNOSIS — E860 Dehydration: Secondary | ICD-10-CM

## 2016-07-16 LAB — COMPREHENSIVE METABOLIC PANEL
ALT: 16 U/L — ABNORMAL LOW (ref 17–63)
AST: 25 U/L (ref 15–41)
Albumin: 3.2 g/dL — ABNORMAL LOW (ref 3.5–5.0)
Alkaline Phosphatase: 58 U/L (ref 38–126)
Anion gap: 7 (ref 5–15)
BUN: 57 mg/dL — ABNORMAL HIGH (ref 6–20)
CO2: 22 mmol/L (ref 22–32)
Calcium: 8.5 mg/dL — ABNORMAL LOW (ref 8.9–10.3)
Chloride: 102 mmol/L (ref 101–111)
Creatinine, Ser: 2.63 mg/dL — ABNORMAL HIGH (ref 0.61–1.24)
GFR calc Af Amer: 31 mL/min — ABNORMAL LOW (ref >60)
GFR calc non Af Amer: 27 mL/min — ABNORMAL LOW (ref >60)
Glucose, Bld: 84 mg/dL (ref 65–99)
Potassium: 5.1 mmol/L (ref 3.5–5.1)
Sodium: 131 mmol/L — ABNORMAL LOW (ref 135–145)
Total Bilirubin: 1.3 mg/dL — ABNORMAL HIGH (ref 0.3–1.2)
Total Protein: 6.1 g/dL — ABNORMAL LOW (ref 6.5–8.1)

## 2016-07-16 LAB — CBC
HCT: 38.7 % — ABNORMAL LOW (ref 39.0–52.0)
Hemoglobin: 12.6 g/dL — ABNORMAL LOW (ref 13.0–17.0)
MCH: 28.1 pg (ref 26.0–34.0)
MCHC: 32.6 g/dL (ref 30.0–36.0)
MCV: 86.2 fL (ref 78.0–100.0)
Platelets: 293 10*3/uL (ref 150–400)
RBC: 4.49 MIL/uL (ref 4.22–5.81)
RDW: 13.3 % (ref 11.5–15.5)
WBC: 10.6 10*3/uL — ABNORMAL HIGH (ref 4.0–10.5)

## 2016-07-16 LAB — TSH: TSH: 0.469 u[IU]/mL (ref 0.350–4.500)

## 2016-07-16 LAB — PHOSPHORUS: Phosphorus: 4.4 mg/dL (ref 2.5–4.6)

## 2016-07-16 LAB — MAGNESIUM: Magnesium: 2.8 mg/dL — ABNORMAL HIGH (ref 1.7–2.4)

## 2016-07-16 LAB — MRSA PCR SCREENING: MRSA by PCR: NEGATIVE

## 2016-07-16 LAB — OSMOLALITY, URINE: Osmolality, Ur: 217 mOsm/kg — ABNORMAL LOW (ref 300–900)

## 2016-07-16 MED ORDER — SODIUM CHLORIDE 0.9 % IV SOLN
INTRAVENOUS | Status: DC
  Administered 2016-07-16 – 2016-07-17 (×4): via INTRAVENOUS

## 2016-07-16 MED ORDER — GI COCKTAIL ~~LOC~~: 30.0000 mL | Freq: Two times a day (BID) | ORAL | Status: DC | PRN

## 2016-07-16 MED ORDER — PANTOPRAZOLE SODIUM 40 MG PO TBEC
40.0000 mg | DELAYED_RELEASE_TABLET | Freq: Every day | ORAL | Status: DC
  Administered 2016-07-16 – 2016-07-17 (×2): 40 mg via ORAL
  Filled 2016-07-16 (×2): qty 1

## 2016-07-16 MED ORDER — ENOXAPARIN SODIUM 40 MG/0.4ML ~~LOC~~ SOLN
40.0000 mg | Freq: Every day | SUBCUTANEOUS | Status: DC
  Administered 2016-07-17: 40 mg via SUBCUTANEOUS
  Filled 2016-07-16: qty 0.4

## 2016-07-16 NOTE — Progress Notes (Signed)
PROGRESS NOTE    Jacob [name]  [id number] DOB: Oct 23, 1966 DOA: 07/15/2016 PCP: Harvie Junior, MD   Brief Narrative:  Mr Plambeck is a 50 year old gentleman with past medical history of schizophrenia, hypertension, admitted to medicine service on 07/13/2016 presenting after having a syncopal event at home. He was found to be hypotensive initially have a blood pressure of 60/40. This was felt likely to be related to dehydration as he reported having multiple episodes of nausea and vomiting. Blood pressures responded to IV fluid bolus challenges. Last had also shown development of acute on chronic renal failure having creatinine of 4.67 improving to 2.6 with IV fluid administration.    Assessment & Plan:   Active Problems:   Schizophrenia, chronic condition (HCC)   Opioid abuse   Essential hypertension   Dehydration   Acute renal failure (ARF) (HCC)   Hyponatremia   AKI (acute kidney injury) (HCC)   Nausea and vomiting   1.  Hypotension secondary to dehydration/volume depletion -This was likely precipitated by GI losses as he reported having several episodes of nausea and vomiting -Initially presenting hypotensive, responded to IV fluid bolus challenge. -On my assessment today he reports feeling better, blood pressures improving -Continue running normal saline at 125 mL/hour  2.  Syncope -He reported feeling dizzy/lightheaded upon standing which in context of nausea/vomiting was likely caused by orthostasis. -Serial troponins negative, denies chest pain, focal neurological deficits -Continue IV fluid resuscitation  3.  Hyponatremia -Presenting lab work showing sodium of 129, like secondary to hypotonic hypovolemia hyponatremia -Continue IV fluid resuscitation  4.  Acute kidney injury -Presented with creatinine of 4.67 with BUN of 69, secondary to prerenal azotemia -Creatinine trended down to 2.6 on a.m. lab work with IV fluid resuscitation  5.  Suspected viral  syndrome -He reports having several family members having similar GI symptoms, suspect infectious gastroenteritis -Continue supportive care  DVT prophylaxis: Lovenox Code Status: Full code Family Communication: I spoke to family members over telephone conversation Disposition Plan: Anticipate discharge in the next 24-48 hours  Consultants:     Procedures:    Subjective: Patient states feeling little better today no acute distress  Objective: Vitals:   07/15/16 2109 07/15/16 2145 07/16/16 0452 07/16/16 0834  BP: 139/83 136/83 (!) 99/56 (!) 107/59  Pulse: 79 75 81 69  Resp:  18 19 20   Temp:  97.8 F (36.6 C) 97.7 F (36.5 C) 97.8 F (36.6 C)  TempSrc:  Oral Oral Oral  SpO2: 98% 99% 98% 99%  Weight:  90.4 kg (199 lb 3.2 oz)    Height:  6\' 1"  (1.854 m)      Intake/Output Summary (Last 24 hours) at 07/16/16 1437 Last data filed at 07/16/16 0601  Gross per 24 hour  Intake          1577.08 ml  Output             1475 ml  Net           102.08 ml   Filed Weights   07/15/16 2145  Weight: 90.4 kg (199 lb 3.2 oz)    Examination:  General exam: Appears calm and comfortable  Respiratory system: Clear to auscultation. Respiratory effort normal. Cardiovascular system: S1 & S2 heard, RRR. No JVD, murmurs, rubs, gallops or clicks. No pedal edema. Gastrointestinal system: Abdomen is nondistended, soft and nontender. No organomegaly or masses felt. Normal bowel sounds heard. Central nervous system: Alert and oriented. No focal neurological deficits. Extremities: Symmetric 5 x  5 power. Skin: No rashes, lesions or ulcers Psychiatry: Judgement and insight appear normal. Mood & affect appropriate.     Data Reviewed: I have personally reviewed following labs and imaging studies  CBC:  Recent Labs Lab 07/15/16 1858 07/16/16 0403  WBC 12.0* 10.6*  HGB 12.3* 12.6*  HCT 37.8* 38.7*  MCV 85.7 86.2  PLT 289 0000000   Basic Metabolic Panel:  Recent Labs Lab 07/15/16 1858  07/16/16 0403  NA 129* 131*  K 3.6 5.1  CL 97* 102  CO2 19* 22  GLUCOSE 103* 84  BUN 69* 57*  CREATININE 4.67* 2.63*  CALCIUM 8.2* 8.5*  MG  --  2.8*  PHOS  --  4.4   GFR: Estimated Creatinine Clearance: 38.4 mL/min (by C-G formula based on SCr of 2.63 mg/dL). Liver Function Tests:  Recent Labs Lab 07/16/16 0403  AST 25  ALT 16*  ALKPHOS 58  BILITOT 1.3*  PROT 6.1*  ALBUMIN 3.2*    Recent Labs Lab 07/15/16 2139  LIPASE 40   No results for input(s): AMMONIA in the last 168 hours. Coagulation Profile: No results for input(s): INR, PROTIME in the last 168 hours. Cardiac Enzymes:  Recent Labs Lab 07/15/16 2139  TROPONINI <0.03   BNP (last 3 results) No results for input(s): PROBNP in the last 8760 hours. HbA1C: No results for input(s): HGBA1C in the last 72 hours. CBG: No results for input(s): GLUCAP in the last 168 hours. Lipid Profile: No results for input(s): CHOL, HDL, LDLCALC, TRIG, CHOLHDL, LDLDIRECT in the last 72 hours. Thyroid Function Tests:  Recent Labs  07/16/16 0403  TSH 0.469   Anemia Panel: No results for input(s): VITAMINB12, FOLATE, FERRITIN, TIBC, IRON, RETICCTPCT in the last 72 hours. Sepsis Labs: No results for input(s): PROCALCITON, LATICACIDVEN in the last 168 hours.  Recent Results (from the past 240 hour(s))  MRSA PCR Screening     Status: None   Collection Time: 07/15/16 10:25 PM  Result Value Ref Range Status   MRSA by PCR NEGATIVE NEGATIVE Final    Comment:        The GeneXpert MRSA Assay (FDA approved for NASAL specimens only), is one component of a comprehensive MRSA colonization surveillance program. It is not intended to diagnose MRSA infection nor to guide or monitor treatment for MRSA infections.          Radiology Studies: Dg Chest 2 View  Result Date: 07/15/2016 CLINICAL DATA:  Patient with shortness of breath and centralized chest pain. EXAM: CHEST  2 VIEW COMPARISON:  Chest radiograph 06/01/2016.  FINDINGS: The heart size and mediastinal contours are within normal limits. Both lungs are clear. The visualized skeletal structures are unremarkable. IMPRESSION: No active cardiopulmonary disease. Electronically Signed   By: Lovey Newcomer M.D.   On: 07/15/2016 19:57   Dg Abd 1 View  Result Date: 07/15/2016 CLINICAL DATA:  Nausea and vomiting for 2 days. EXAM: ABDOMEN - 1 VIEW COMPARISON:  03/09/2005 FINDINGS: The bowel gas pattern is normal. No abnormal stool retention. No radio-opaque calculi or other significant radiographic abnormality are seen. IMPRESSION: Normal bowel gas pattern. Electronically Signed   By: Monte Fantasia M.D.   On: 07/15/2016 21:36   Dg Knee Complete 4 Views Left  Result Date: 07/15/2016 CLINICAL DATA:  Patient with anterior knee pain for 3 hours status post fall. EXAM: LEFT KNEE - COMPLETE 4+ VIEW COMPARISON:  Knee radiograph 12/20/2011. FINDINGS: Interval progression medial compartment joint space loss with bone-on-bone contact. Severe tricompartmental osteophytosis. No  joint effusion. Regional soft tissues are unremarkable. IMPRESSION: Marked tricompartmental osteoarthritis. No acute osseous abnormality. Electronically Signed   By: Lovey Newcomer M.D.   On: 07/15/2016 19:56        Scheduled Meds: . amantadine  100 mg Oral TID  . benztropine  1 mg Oral Daily  . [START ON 07/17/2016] enoxaparin (LOVENOX) injection  40 mg Subcutaneous Daily  . FLUoxetine  20 mg Oral Daily  . pantoprazole  40 mg Oral Daily  . risperiDONE  3 mg Oral Daily   And  . risperiDONE  6 mg Oral QHS  . sodium chloride flush  3 mL Intravenous Q12H  . traZODone  100-200 mg Oral QHS   Continuous Infusions: . sodium chloride       LOS: 1 day    Time spent: 35 minutes    Kelvin Cellar, MD [facility] Pager [phone]  If 7PM-7AM, please contact night-coverage [email or web address] Password [email or web address] 07/16/2016, 2:37 PM

## 2016-07-16 NOTE — Plan of Care (Signed)
Problem: Activity: Goal: Risk for activity intolerance will decrease Outcome: Progressing Able to ambulate without difficulty.  Problem: Bowel/Gastric: Goal: Will not experience complications related to bowel motility Outcome: Completed/Met Date Met: 07/16/16 Patient has a great appetite.

## 2016-07-17 ENCOUNTER — Inpatient Hospital Stay (HOSPITAL_COMMUNITY): Payer: Medicare Other

## 2016-07-17 DIAGNOSIS — N179 Acute kidney failure, unspecified: Principal | ICD-10-CM

## 2016-07-17 DIAGNOSIS — R1110 Vomiting, unspecified: Secondary | ICD-10-CM

## 2016-07-17 DIAGNOSIS — E871 Hypo-osmolality and hyponatremia: Secondary | ICD-10-CM

## 2016-07-17 DIAGNOSIS — I10 Essential (primary) hypertension: Secondary | ICD-10-CM

## 2016-07-17 DIAGNOSIS — R55 Syncope and collapse: Secondary | ICD-10-CM

## 2016-07-17 LAB — BASIC METABOLIC PANEL
Anion gap: 5 (ref 5–15)
BUN: 33 mg/dL — ABNORMAL HIGH (ref 6–20)
CO2: 24 mmol/L (ref 22–32)
Calcium: 8.9 mg/dL (ref 8.9–10.3)
Chloride: 108 mmol/L (ref 101–111)
Creatinine, Ser: 1.27 mg/dL — ABNORMAL HIGH (ref 0.61–1.24)
GFR calc Af Amer: >60 mL/min (ref >60)
GFR calc non Af Amer: >60 mL/min (ref >60)
Glucose, Bld: 85 mg/dL (ref 65–99)
Potassium: 4.9 mmol/L (ref 3.5–5.1)
Sodium: 137 mmol/L (ref 135–145)

## 2016-07-17 LAB — CBC
HCT: 38.1 % — ABNORMAL LOW (ref 39.0–52.0)
Hemoglobin: 12.3 g/dL — ABNORMAL LOW (ref 13.0–17.0)
MCH: 28.2 pg (ref 26.0–34.0)
MCHC: 32.3 g/dL (ref 30.0–36.0)
MCV: 87.4 fL (ref 78.0–100.0)
Platelets: 294 10*3/uL (ref 150–400)
RBC: 4.36 MIL/uL (ref 4.22–5.81)
RDW: 13.5 % (ref 11.5–15.5)
WBC: 7 10*3/uL (ref 4.0–10.5)

## 2016-07-17 LAB — ECHOCARDIOGRAM COMPLETE
Height: 73 in
Weight: 3236.8 oz

## 2016-07-17 MED ORDER — TRAZODONE HCL 100 MG PO TABS
100.0000 mg | ORAL_TABLET | Freq: Every day | ORAL | 1 refills | Status: DC
Start: 2016-07-17 — End: 2016-08-23

## 2016-07-17 MED ORDER — WHITE PETROLATUM GEL
Status: AC
  Administered 2016-07-17: 09:00:00
  Filled 2016-07-17: qty 1

## 2016-07-17 MED ORDER — RISPERIDONE 3 MG PO TABS: 3.0000 mg | ORAL_TABLET | ORAL | 1 refills | Status: DC

## 2016-07-17 MED ORDER — BENZTROPINE MESYLATE 1 MG PO TABS: 1.0000 mg | ORAL_TABLET | Freq: Every day | ORAL | 1 refills | Status: DC

## 2016-07-17 MED ORDER — FLUOXETINE HCL 20 MG PO CAPS: 20.0000 mg | ORAL_CAPSULE | Freq: Every day | ORAL | 3 refills | Status: DC

## 2016-07-17 MED ORDER — AMANTADINE HCL 100 MG PO CAPS: 100.0000 mg | ORAL_CAPSULE | Freq: Three times a day (TID) | ORAL | 1 refills | Status: DC

## 2016-07-17 NOTE — Progress Notes (Signed)
  Echocardiogram 2D Echocardiogram has been performed.  Jacob [name] 07/17/2016, 11:23 AM

## 2016-07-17 NOTE — Progress Notes (Signed)
Flossie Dibble to be D/C'd Home per MD order.  Discussed prescriptions and follow up appointments with the patient. Prescriptions given to patient, medication list explained in detail. Pt verbalized understanding.    Medication List    STOP taking these medications   hydrochlorothiazide 25 MG tablet Commonly known as:  HYDRODIURIL   pindolol 5 MG tablet Commonly known as:  VISKEN   risperiDONE microspheres 37.5 MG injection Commonly known as:  RISPERDAL CONSTA     TAKE these medications   amantadine 100 MG capsule Commonly known as:  SYMMETREL Take 1 capsule (100 mg total) by mouth 3 (three) times daily.   benztropine 1 MG tablet Commonly known as:  COGENTIN Take 1 tablet (1 mg total) by mouth daily.   FLUoxetine 20 MG capsule Commonly known as:  PROZAC Take 1 capsule (20 mg total) by mouth daily.   risperiDONE 3 MG tablet Commonly known as:  RISPERDAL Take 1-2 tablets (3-6 mg total) by mouth See admin instructions. Take 3mg  every morning Take 6mg  at bedtime   traZODone 100 MG tablet Commonly known as:  DESYREL Take 1-2 tablets (100-200 mg total) by mouth at bedtime.       Vitals:   07/17/16 0449 07/17/16 1000  BP: (!) 144/85 139/79  Pulse: 67 95  Resp: 19 18  Temp: 97.7 F (36.5 C) 98 F (36.7 C)    Skin clean, dry and intact without evidence of skin break down, no evidence of skin tears noted. IV catheter discontinued intact. Site without signs and symptoms of complications. Dressing and pressure applied. Pt denies pain at this time. No complaints noted.  An After Visit Summary was printed and given to the patient. Patient escorted via [place], and D/C home via private auto.  Carole Civil RN [facility] Phone [phone]

## 2016-07-17 NOTE — Discharge Summary (Signed)
Physician Discharge Summary  Jacob [name] [id number] DOB: 1966-10-14 DOA: 07/15/2016  PCP: Harvie Junior, MD  Admit date: 07/15/2016 Discharge date: 07/17/2016  Time spent: 35 minutes  Recommendations for Outpatient Follow-up:  1. Please follow up on renal function, he was admitted for syncope and AKI, likely resulting from volume depletion/orthostasis 2. Please follow up on blood pressures    Discharge Diagnoses:  Active Problems:   Schizophrenia, chronic condition (HCC)   Opioid abuse   Essential hypertension   Dehydration   Acute renal failure (ARF) (HCC)   Hyponatremia   AKI (acute kidney injury) ([place])   Nausea and vomiting   Discharge Condition: Stable  Diet recommendation: Regular  Filed Weights   07/15/16 2145 07/16/16 2028  Weight: 90.4 kg (199 lb 3.2 oz) 91.8 kg (202 lb 4.8 oz)    History of present illness:  Presented with syncopal event after being at home resulting in her condition decreased by mouth intake. On arrival by EMS blood pressure initially 60/40 and he received 1 L normal saline and blood pressure 1 up to 90s. Patient was endorsing he ran out of his psychiatric medications 2 days ago but continued to take his blood pressure medications. He had a similar fall in end of May and was diagnosed with compression fracture. He was supposed to follow up with orthopedics and PCP for his compression fracture.  No head trauma she was feeling only light headed when he was trying to stand up. He had similar near syncopal episode with a similar presentation and associated dehydration.   reports nausea and vomiting with Any PO intake. He is worried that someone is poisoning him.  Reports he did not eat or drink for 4 days. He endorses some occasional Tarry stools  His psych medications have been changes recently his last injection was last week. He is not sure about a name.   Hospital Course:  Jacob [name] is a 50 year old gentleman with past medical  history of schizophrenia, hypertension, admitted to medicine service on 07/13/2016 presenting after having a syncopal event at home. He was found to be hypotensive initially have a blood pressure of 60/40. This was felt likely to be related to dehydration as he reported having multiple episodes of nausea and vomiting. Blood pressures responded to IV fluid bolus challenges. Last had also shown development of acute on chronic renal failure having creatinine of 4.67 improving to 2.6 with IV fluid administration.   1.  Hypotension secondary to dehydration/volume depletion -This was likely precipitated by GI losses as he reported having several episodes of nausea and vomiting -Initially presenting hypotensive, responded to IV fluid bolus challenge. -On my assessment today he reports feeling better, blood pressures improving -On 07/17/2016 he was ambulated down the hallway, did well.   2.  Syncope -He reported feeling dizzy/lightheaded upon standing which in context of nausea/vomiting was likely caused by orthostasis. -Serial troponins negative, denies chest pain, focal neurological deficits -Echo showed EF of 60-65%  3.  Hyponatremia -Presenting lab work showing sodium of 129, like secondary to hypotonic hypovolemia hyponatremia -AM labs on 07/17/2016 showing resolution of hyponatremia with Na 137.   4.  Acute kidney injury -Presented with creatinine of 4.67 with BUN of 69, secondary to prerenal azotemia -Creatinine trended down to 1.27 on a.m. lab work on 07/17/2016 with IV fluid resuscitation  5.  Suspected viral syndrome -He reports having several family members having similar GI symptoms, suspect infectious gastroenteritis -N/V resolved  Procedures:  2D Echo Impression: -  Left ventricle: The cavity size was normal. Systolic function was   normal. The estimated ejection fraction was in the range of 60%   to 65%. Wall motion was normal; there were no regional wall   motion  abnormalities. Left ventricular diastolic function   parameters were normal. - Left atrium: The atrium was mildly dilated. - Atrial septum: No defect or patent foramen ovale was identified. - Impressions: Normal GLS - 17.1.   Discharge Exam: Vitals:   07/17/16 0449 07/17/16 1000  BP: (!) 144/85 139/79  Pulse: 67 95  Resp: 19 18  Temp: 97.7 F (36.5 C) 98 F (36.7 C)   General exam: Appears calm and comfortable  Respiratory system: Clear to auscultation. Respiratory effort normal. Cardiovascular system: S1 & S2 heard, RRR. No JVD, murmurs, rubs, gallops or clicks. No pedal edema. Gastrointestinal system: Abdomen is nondistended, soft and nontender. No organomegaly or masses felt. Normal bowel sounds heard. Central nervous system: Alert and oriented. No focal neurological deficits. Extremities: Symmetric 5 x 5 power. Skin: No rashes, lesions or ulcers Psychiatry: Judgement and insight appear normal. Mood & affect appropriate.    Discharge Instructions   Discharge Instructions    Call MD for:    Complete by:  As directed   Call MD for:  difficulty breathing, headache or visual disturbances    Complete by:  As directed   Call MD for:  extreme fatigue    Complete by:  As directed   Call MD for:  hives    Complete by:  As directed   Call MD for:  persistant dizziness or light-headedness    Complete by:  As directed   Call MD for:  persistant nausea and vomiting    Complete by:  As directed   Call MD for:  redness, tenderness, or signs of infection (pain, swelling, redness, odor or green/yellow discharge around incision site)    Complete by:  As directed   Call MD for:  severe uncontrolled pain    Complete by:  As directed   Call MD for:  temperature >100.4    Complete by:  As directed   Diet - low sodium heart healthy    Complete by:  As directed   Increase activity slowly    Complete by:  As directed     Current Discharge Medication List    CONTINUE these medications which  have CHANGED   Details  amantadine (SYMMETREL) 100 MG capsule Take 1 capsule (100 mg total) by mouth 3 (three) times daily. Qty: 30 capsule, Refills: 1    benztropine (COGENTIN) 1 MG tablet Take 1 tablet (1 mg total) by mouth daily. Qty: 30 tablet, Refills: 1    FLUoxetine (PROZAC) 20 MG capsule Take 1 capsule (20 mg total) by mouth daily. Qty: 30 capsule, Refills: 3    risperiDONE (RISPERDAL) 3 MG tablet Take 1-2 tablets (3-6 mg total) by mouth See admin instructions. Take 3mg  every morning Take 6mg  at bedtime Qty: 60 tablet, Refills: 1    traZODone (DESYREL) 100 MG tablet Take 1-2 tablets (100-200 mg total) by mouth at bedtime. Qty: 30 tablet, Refills: 1      STOP taking these medications     hydrochlorothiazide (HYDRODIURIL) 25 MG tablet      pindolol (VISKEN) 5 MG tablet      risperiDONE microspheres (RISPERDAL CONSTA) 37.5 MG injection        Allergies  Allergen Reactions  . Abilify [Aripiprazole] Other (See Comments)    Per patient "  black outs" not sure what happens  . Cogentin [Benztropine] Other (See Comments)    Dehydration      The results of significant diagnostics from this hospitalization (including imaging, microbiology, ancillary and laboratory) are listed below for reference.    Significant Diagnostic Studies: Dg Chest 2 View  Result Date: 07/15/2016 CLINICAL DATA:  Patient with shortness of breath and centralized chest pain. EXAM: CHEST  2 VIEW COMPARISON:  Chest radiograph 06/01/2016. FINDINGS: The heart size and mediastinal contours are within normal limits. Both lungs are clear. The visualized skeletal structures are unremarkable. IMPRESSION: No active cardiopulmonary disease. Electronically Signed   By: Lovey Newcomer M.D.   On: 07/15/2016 19:57   Dg Abd 1 View  Result Date: 07/15/2016 CLINICAL DATA:  Nausea and vomiting for 2 days. EXAM: ABDOMEN - 1 VIEW COMPARISON:  03/09/2005 FINDINGS: The bowel gas pattern is normal. No abnormal stool retention. No  radio-opaque calculi or other significant radiographic abnormality are seen. IMPRESSION: Normal bowel gas pattern. Electronically Signed   By: Monte Fantasia M.D.   On: 07/15/2016 21:36   Dg Esophagus  Result Date: 07/17/2016 CLINICAL DATA:  Vomiting.  Dysphagia. EXAM: ESOPHOGRAM/BARIUM SWALLOW TECHNIQUE: Single contrast examination was performed using  thin barium. FLUOROSCOPY TIME:  Fluoroscopy Time:  1 minute 29 seconds COMPARISON:  CT scan of the chest dated 08/29/2015 FINDINGS: The oropharyngeal swallowing mechanisms appear normal. The mucosa of the esophagus is normal. There are occasional tertiary contractions in the mid and distal esophagus. The esophageal motility is otherwise normal. No hiatal hernia or stricture. IMPRESSION: Minimal tertiary contractions in the mid and distal esophagus. Otherwise, normal esophagram. Electronically Signed   By: Lorriane Shire M.D.   On: 07/17/2016 09:58  Dg Knee Complete 4 Views Left  Result Date: 07/15/2016 CLINICAL DATA:  Patient with anterior knee pain for 3 hours status post fall. EXAM: LEFT KNEE - COMPLETE 4+ VIEW COMPARISON:  Knee radiograph 12/20/2011. FINDINGS: Interval progression medial compartment joint space loss with bone-on-bone contact. Severe tricompartmental osteophytosis. No joint effusion. Regional soft tissues are unremarkable. IMPRESSION: Marked tricompartmental osteoarthritis. No acute osseous abnormality. Electronically Signed   By: Lovey Newcomer M.D.   On: 07/15/2016 19:56    Microbiology: Recent Results (from the past 240 hour(s))  MRSA PCR Screening     Status: None   Collection Time: 07/15/16 10:25 PM  Result Value Ref Range Status   MRSA by PCR NEGATIVE NEGATIVE Final    Comment:        The GeneXpert MRSA Assay (FDA approved for NASAL specimens only), is one component of a comprehensive MRSA colonization surveillance program. It is not intended to diagnose MRSA infection nor to guide or monitor treatment for MRSA  infections.      Labs: Basic Metabolic Panel:  Recent Labs Lab 07/15/16 1858 07/16/16 0403 07/17/16 0447  NA 129* 131* 137  K 3.6 5.1 4.9  CL 97* 102 108  CO2 19* 22 24  GLUCOSE 103* 84 85  BUN 69* 57* 33*  CREATININE 4.67* 2.63* 1.27*  CALCIUM 8.2* 8.5* 8.9  MG  --  2.8*  --   PHOS  --  4.4  --    Liver Function Tests:  Recent Labs Lab 07/16/16 0403  AST 25  ALT 16*  ALKPHOS 58  BILITOT 1.3*  PROT 6.1*  ALBUMIN 3.2*    Recent Labs Lab 07/15/16 2139  LIPASE 40   No results for input(s): AMMONIA in the last 168 hours. CBC:  Recent Labs Lab  07/15/16 1858 07/16/16 0403 07/17/16 0447  WBC 12.0* 10.6* 7.0  HGB 12.3* 12.6* 12.3*  HCT 37.8* 38.7* 38.1*  MCV 85.7 86.2 87.4  PLT 289 293 294   Cardiac Enzymes:  Recent Labs Lab 07/15/16 2139  TROPONINI <0.03   BNP: BNP (last 3 results) No results for input(s): BNP in the last 8760 hours.  ProBNP (last 3 results) No results for input(s): PROBNP in the last 8760 hours.  CBG: No results for input(s): GLUCAP in the last 168 hours.     Signed:  Kelvin Cellar MD.  [facility] 07/17/2016, 2:32 PM

## 2016-07-17 NOTE — Care Management Important Message (Signed)
Important Message  Patient Details  Name: Jacob [name] MRN: [id number] Date of Birth: [birth year]   Medicare Important Message Given:  Yes    Loann Quill 07/17/2016, 9:07 AM

## 2016-08-22 ENCOUNTER — Encounter (HOSPITAL_COMMUNITY): Payer: Self-pay | Admitting: Emergency Medicine

## 2016-08-22 ENCOUNTER — Emergency Department (HOSPITAL_COMMUNITY): Payer: Medicare Other

## 2016-08-22 ENCOUNTER — Observation Stay (HOSPITAL_COMMUNITY)
Admission: EM | Admit: 2016-08-22 | Discharge: 2016-08-23 | Disposition: A | Discharge status: Home / Self Care | Payer: Medicare Other | Attending: Internal Medicine | Admitting: Internal Medicine

## 2016-08-22 DIAGNOSIS — G510 Bell's palsy: Secondary | ICD-10-CM | POA: Diagnosis not present

## 2016-08-22 DIAGNOSIS — G8929 Other chronic pain: Secondary | ICD-10-CM | POA: Diagnosis not present

## 2016-08-22 DIAGNOSIS — F209 Schizophrenia, unspecified: Secondary | ICD-10-CM | POA: Diagnosis not present

## 2016-08-22 DIAGNOSIS — R29810 Facial weakness: Secondary | ICD-10-CM

## 2016-08-22 DIAGNOSIS — I69392 Facial weakness following cerebral infarction: Secondary | ICD-10-CM | POA: Diagnosis not present

## 2016-08-22 DIAGNOSIS — Z7982 Long term (current) use of aspirin: Secondary | ICD-10-CM | POA: Diagnosis not present

## 2016-08-22 DIAGNOSIS — M1990 Unspecified osteoarthritis, unspecified site: Secondary | ICD-10-CM | POA: Insufficient documentation

## 2016-08-22 DIAGNOSIS — Z6826 Body mass index (BMI) 26.0-26.9, adult: Secondary | ICD-10-CM | POA: Insufficient documentation

## 2016-08-22 DIAGNOSIS — R1310 Dysphagia, unspecified: Secondary | ICD-10-CM | POA: Diagnosis not present

## 2016-08-22 DIAGNOSIS — E785 Hyperlipidemia, unspecified: Secondary | ICD-10-CM | POA: Diagnosis not present

## 2016-08-22 DIAGNOSIS — M545 Low back pain, unspecified: Secondary | ICD-10-CM | POA: Diagnosis present

## 2016-08-22 DIAGNOSIS — F17210 Nicotine dependence, cigarettes, uncomplicated: Secondary | ICD-10-CM | POA: Insufficient documentation

## 2016-08-22 DIAGNOSIS — M4856XA Collapsed vertebra, not elsewhere classified, lumbar region, initial encounter for fracture: Secondary | ICD-10-CM | POA: Diagnosis not present

## 2016-08-22 DIAGNOSIS — I618 Other nontraumatic intracerebral hemorrhage: Secondary | ICD-10-CM | POA: Insufficient documentation

## 2016-08-22 DIAGNOSIS — I639 Cerebral infarction, unspecified: Secondary | ICD-10-CM | POA: Diagnosis present

## 2016-08-22 DIAGNOSIS — I6330 Cerebral infarction due to thrombosis of unspecified cerebral artery: Secondary | ICD-10-CM | POA: Insufficient documentation

## 2016-08-22 DIAGNOSIS — R471 Dysarthria and anarthria: Secondary | ICD-10-CM | POA: Diagnosis not present

## 2016-08-22 DIAGNOSIS — F419 Anxiety disorder, unspecified: Secondary | ICD-10-CM | POA: Insufficient documentation

## 2016-08-22 DIAGNOSIS — E663 Overweight: Secondary | ICD-10-CM | POA: Insufficient documentation

## 2016-08-22 DIAGNOSIS — F319 Bipolar disorder, unspecified: Secondary | ICD-10-CM | POA: Diagnosis not present

## 2016-08-22 DIAGNOSIS — I160 Hypertensive urgency: Secondary | ICD-10-CM | POA: Insufficient documentation

## 2016-08-22 DIAGNOSIS — E860 Dehydration: Secondary | ICD-10-CM | POA: Diagnosis not present

## 2016-08-22 DIAGNOSIS — I6789 Other cerebrovascular disease: Secondary | ICD-10-CM | POA: Diagnosis not present

## 2016-08-22 DIAGNOSIS — R29818 Other symptoms and signs involving the nervous system: Secondary | ICD-10-CM | POA: Diagnosis not present

## 2016-08-22 DIAGNOSIS — I10 Essential (primary) hypertension: Secondary | ICD-10-CM | POA: Diagnosis not present

## 2016-08-22 LAB — RAPID URINE DRUG SCREEN, HOSP PERFORMED
Amphetamines: NOT DETECTED
Barbiturates: NOT DETECTED
Benzodiazepines: NOT DETECTED
Cocaine: NOT DETECTED
Opiates: NOT DETECTED
Tetrahydrocannabinol: NOT DETECTED

## 2016-08-22 LAB — CBC
HCT: 42 % (ref 39.0–52.0)
Hemoglobin: 13.6 g/dL (ref 13.0–17.0)
MCH: 28.2 pg (ref 26.0–34.0)
MCHC: 32.4 g/dL (ref 30.0–36.0)
MCV: 87 fL (ref 78.0–100.0)
Platelets: 293 10*3/uL (ref 150–400)
RBC: 4.83 MIL/uL (ref 4.22–5.81)
RDW: 13 % (ref 11.5–15.5)
WBC: 9.6 10*3/uL (ref 4.0–10.5)

## 2016-08-22 LAB — COMPREHENSIVE METABOLIC PANEL
ALT: 22 U/L (ref 17–63)
AST: 17 U/L (ref 15–41)
Albumin: 3.6 g/dL (ref 3.5–5.0)
Alkaline Phosphatase: 70 U/L (ref 38–126)
Anion gap: 8 (ref 5–15)
BUN: 7 mg/dL (ref 6–20)
CO2: 25 mmol/L (ref 22–32)
Calcium: 9.3 mg/dL (ref 8.9–10.3)
Chloride: 105 mmol/L (ref 101–111)
Creatinine, Ser: 0.79 mg/dL (ref 0.61–1.24)
GFR calc Af Amer: >60 mL/min (ref >60)
GFR calc non Af Amer: >60 mL/min (ref >60)
Glucose, Bld: 89 mg/dL (ref 65–99)
Potassium: 3.8 mmol/L (ref 3.5–5.1)
Sodium: 138 mmol/L (ref 135–145)
Total Bilirubin: 0.4 mg/dL (ref 0.3–1.2)
Total Protein: 6.6 g/dL (ref 6.5–8.1)

## 2016-08-22 LAB — TROPONIN I: Troponin I: <0.03 ng/mL (ref <0.03)

## 2016-08-22 LAB — DIFFERENTIAL
Basophils Absolute: 0.1 10*3/uL (ref 0.0–0.1)
Basophils Relative: 1 %
Eosinophils Absolute: 0.1 10*3/uL (ref 0.0–0.7)
Eosinophils Relative: 1 %
Lymphocytes Relative: 42 %
Lymphs Abs: 4.1 10*3/uL — ABNORMAL HIGH (ref 0.7–4.0)
Monocytes Absolute: 0.8 10*3/uL (ref 0.1–1.0)
Monocytes Relative: 9 %
Neutro Abs: 4.6 10*3/uL (ref 1.7–7.7)
Neutrophils Relative %: 47 %

## 2016-08-22 LAB — I-STAT CHEM 8, ED
BUN: 8 mg/dL (ref 6–20)
Calcium, Ion: 1.18 mmol/L (ref 1.15–1.40)
Chloride: 104 mmol/L (ref 101–111)
Creatinine, Ser: 0.8 mg/dL (ref 0.61–1.24)
Glucose, Bld: 89 mg/dL (ref 65–99)
HCT: 41 % (ref 39.0–52.0)
Hemoglobin: 13.9 g/dL (ref 13.0–17.0)
Potassium: 4 mmol/L (ref 3.5–5.1)
Sodium: 140 mmol/L (ref 135–145)
TCO2: 29 mmol/L (ref 0–100)

## 2016-08-22 LAB — APTT: aPTT: 25 seconds (ref 24–36)

## 2016-08-22 LAB — I-STAT TROPONIN, ED: Troponin i, poc: 0 ng/mL (ref 0.00–0.08)

## 2016-08-22 LAB — PROTIME-INR
INR: 1
Prothrombin Time: 13.2 seconds (ref 11.4–15.2)

## 2016-08-22 MED ORDER — LISINOPRIL 10 MG PO TABS
10.0000 mg | ORAL_TABLET | Freq: Every day | ORAL | Status: DC
  Administered 2016-08-23: 10 mg via ORAL
  Filled 2016-08-22: qty 1

## 2016-08-22 MED ORDER — RISPERIDONE 3 MG PO TABS
3.0000 mg | ORAL_TABLET | Freq: Every day | ORAL | Status: DC
  Filled 2016-08-22: qty 1

## 2016-08-22 MED ORDER — LORATADINE 10 MG PO TABS
10.0000 mg | ORAL_TABLET | Freq: Every day | ORAL | Status: DC
  Administered 2016-08-22 – 2016-08-23 (×2): 10 mg via ORAL
  Filled 2016-08-22 (×2): qty 1

## 2016-08-22 MED ORDER — STROKE: EARLY STAGES OF RECOVERY BOOK
Freq: Once | Status: DC
  Filled 2016-08-22: qty 1

## 2016-08-22 MED ORDER — ATORVASTATIN CALCIUM 80 MG PO TABS
80.0000 mg | ORAL_TABLET | Freq: Every day | ORAL | Status: DC
  Filled 2016-08-22: qty 1

## 2016-08-22 MED ORDER — STROKE: EARLY STAGES OF RECOVERY BOOK
Freq: Once | Status: AC
  Administered 2016-08-22: 22:00:00
  Filled 2016-08-22: qty 1

## 2016-08-22 MED ORDER — TRAZODONE HCL 100 MG PO TABS
100.0000 mg | ORAL_TABLET | Freq: Every day | ORAL | Status: DC
  Administered 2016-08-22: 100 mg via ORAL
  Filled 2016-08-22: qty 2

## 2016-08-22 MED ORDER — SODIUM CHLORIDE 0.9 % IV SOLN
INTRAVENOUS | Status: DC
  Administered 2016-08-22: 22:00:00 via INTRAVENOUS

## 2016-08-22 MED ORDER — RISPERIDONE 0.5 MG PO TABS
4.0000 mg | ORAL_TABLET | Freq: Every evening | ORAL | Status: DC
  Administered 2016-08-23: 4 mg via ORAL
  Filled 2016-08-22 (×2): qty 8

## 2016-08-22 MED ORDER — FLUOXETINE HCL 20 MG PO CAPS
40.0000 mg | ORAL_CAPSULE | Freq: Every evening | ORAL | Status: DC
  Administered 2016-08-23: 40 mg via ORAL
  Filled 2016-08-22 (×2): qty 2

## 2016-08-22 MED ORDER — ENOXAPARIN SODIUM 40 MG/0.4ML ~~LOC~~ SOLN
40.0000 mg | SUBCUTANEOUS | Status: DC
  Administered 2016-08-22: 40 mg via SUBCUTANEOUS
  Filled 2016-08-22: qty 0.4

## 2016-08-22 MED ORDER — HYDRALAZINE HCL 20 MG/ML IJ SOLN
10.0000 mg | INTRAMUSCULAR | Status: DC | PRN
Start: 2016-08-22 — End: 2016-08-23

## 2016-08-22 MED ORDER — FLUTICASONE PROPIONATE 50 MCG/ACT NA SUSP
1.0000 | Freq: Two times a day (BID) | NASAL | Status: DC
  Administered 2016-08-22 – 2016-08-23 (×2): 1 via NASAL
  Filled 2016-08-22: qty 16

## 2016-08-22 MED ORDER — BUSPIRONE HCL 10 MG PO TABS
10.0000 mg | ORAL_TABLET | Freq: Two times a day (BID) | ORAL | Status: DC
  Administered 2016-08-22 – 2016-08-23 (×2): 10 mg via ORAL
  Filled 2016-08-22 (×2): qty 1

## 2016-08-22 MED ORDER — BENZTROPINE MESYLATE 1 MG PO TABS
1.0000 mg | ORAL_TABLET | Freq: Four times a day (QID) | ORAL | Status: DC
  Administered 2016-08-22 – 2016-08-23 (×3): 1 mg via ORAL
  Filled 2016-08-22 (×4): qty 1

## 2016-08-22 MED ORDER — ASPIRIN 300 MG RE SUPP: 300.0000 mg | Freq: Every day | RECTAL | Status: DC

## 2016-08-22 MED ORDER — ASPIRIN 325 MG PO TABS
325.0000 mg | ORAL_TABLET | Freq: Every day | ORAL | Status: DC
  Administered 2016-08-22 – 2016-08-23 (×2): 325 mg via ORAL
  Filled 2016-08-22 (×2): qty 1

## 2016-08-22 MED ORDER — ACETAMINOPHEN 325 MG PO TABS
650.0000 mg | ORAL_TABLET | Freq: Four times a day (QID) | ORAL | Status: DC | PRN
  Administered 2016-08-22 – 2016-08-23 (×4): 650 mg via ORAL
  Filled 2016-08-22 (×4): qty 2

## 2016-08-22 NOTE — ED Notes (Signed)
Patient has returned from CT; patient is ankle-cuffed to stretcher and has 2 Sheriff [employer]uties at bedside
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
## 2016-08-22 NOTE — ED Triage Notes (Signed)
Pt to ER BIB [facility] from [facility] as Code Stroke. Pt seen for rounds at 12 pm and was said to be normal. Pt then seen again at 1645 with noted left facial droop, slurred speech, and left sided weakness. Code Stroke activated at 17:18. Pt has extensive medical hx. VS - 150/115, HR 73, CBG 112. 16 g to RAC. A/ox4 on arrival.

## 2016-08-23 ENCOUNTER — Observation Stay (HOSPITAL_COMMUNITY): Payer: Medicare Other

## 2016-08-23 DIAGNOSIS — H538 Other visual disturbances: Secondary | ICD-10-CM | POA: Diagnosis not present

## 2016-08-23 DIAGNOSIS — R29810 Facial weakness: Secondary | ICD-10-CM | POA: Diagnosis not present

## 2016-08-23 DIAGNOSIS — S32010A Wedge compression fracture of first lumbar vertebra, initial encounter for closed fracture: Secondary | ICD-10-CM | POA: Diagnosis not present

## 2016-08-23 DIAGNOSIS — G510 Bell's palsy: Secondary | ICD-10-CM | POA: Diagnosis not present

## 2016-08-23 DIAGNOSIS — I6330 Cerebral infarction due to thrombosis of unspecified cerebral artery: Secondary | ICD-10-CM | POA: Diagnosis not present

## 2016-08-23 LAB — COMPREHENSIVE METABOLIC PANEL
ALT: 22 U/L (ref 17–63)
AST: 18 U/L (ref 15–41)
Albumin: 3.4 g/dL — ABNORMAL LOW (ref 3.5–5.0)
Alkaline Phosphatase: 70 U/L (ref 38–126)
Anion gap: 9 (ref 5–15)
BUN: 6 mg/dL (ref 6–20)
CO2: 26 mmol/L (ref 22–32)
Calcium: 9.3 mg/dL (ref 8.9–10.3)
Chloride: 106 mmol/L (ref 101–111)
Creatinine, Ser: 0.78 mg/dL (ref 0.61–1.24)
GFR calc Af Amer: >60 mL/min (ref >60)
GFR calc non Af Amer: >60 mL/min (ref >60)
Glucose, Bld: 80 mg/dL (ref 65–99)
Potassium: 3.9 mmol/L (ref 3.5–5.1)
Sodium: 141 mmol/L (ref 135–145)
Total Bilirubin: 0.5 mg/dL (ref 0.3–1.2)
Total Protein: 6.3 g/dL — ABNORMAL LOW (ref 6.5–8.1)

## 2016-08-23 LAB — CBC
HCT: 43.2 % (ref 39.0–52.0)
Hemoglobin: 13.8 g/dL (ref 13.0–17.0)
MCH: 27.8 pg (ref 26.0–34.0)
MCHC: 31.9 g/dL (ref 30.0–36.0)
MCV: 86.9 fL (ref 78.0–100.0)
Platelets: 276 10*3/uL (ref 150–400)
RBC: 4.97 MIL/uL (ref 4.22–5.81)
RDW: 13 % (ref 11.5–15.5)
WBC: 8.4 10*3/uL (ref 4.0–10.5)

## 2016-08-23 LAB — GLUCOSE, CAPILLARY: Glucose-Capillary: 102 mg/dL — ABNORMAL HIGH (ref 65–99)

## 2016-08-23 LAB — LIPID PANEL
Cholesterol: 181 mg/dL (ref 0–200)
HDL: 31 mg/dL — ABNORMAL LOW (ref >40)
LDL Cholesterol: 128 mg/dL — ABNORMAL HIGH (ref 0–99)
Total CHOL/HDL Ratio: 5.8 RATIO
Triglycerides: 108 mg/dL (ref <150)
VLDL: 22 mg/dL (ref 0–40)

## 2016-08-23 LAB — HEMOGLOBIN A1C
Hgb A1c MFr Bld: 5.6 % (ref 4.8–5.6)
Mean Plasma Glucose: 114 mg/dL

## 2016-08-23 LAB — TROPONIN I
Troponin I: <0.03 ng/mL (ref <0.03)
Troponin I: <0.03 ng/mL (ref <0.03)

## 2016-08-23 MED ORDER — BENZTROPINE MESYLATE 1 MG PO TABS
1.0000 mg | ORAL_TABLET | Freq: Four times a day (QID) | ORAL | Status: DC
Start: 2016-08-23 — End: 2017-03-13

## 2016-08-23 MED ORDER — PREDNISONE 20 MG PO TABS: 60.0000 mg | ORAL_TABLET | Freq: Every day | ORAL | 0 refills | Status: DC

## 2016-08-23 MED ORDER — LISINOPRIL-HYDROCHLOROTHIAZIDE 10-12.5 MG PO TABS: 1.0000 | ORAL_TABLET | Freq: Every day | ORAL | 0 refills | Status: DC

## 2016-08-23 MED ORDER — PREDNISONE 20 MG PO TABS: 60.0000 mg | ORAL_TABLET | Freq: Every day | ORAL | Status: DC

## 2016-08-23 MED ORDER — POLYVINYL ALCOHOL 1.4 % OP SOLN: 1.0000 [drp] | OPHTHALMIC | 0 refills | Status: DC | PRN

## 2016-08-23 MED ORDER — ARTIFICIAL TEARS OP OINT: TOPICAL_OINTMENT | Freq: Every day | OPHTHALMIC | 0 refills | Status: DC

## 2016-08-23 MED ORDER — ATORVASTATIN CALCIUM 10 MG PO TABS: 10.0000 mg | ORAL_TABLET | Freq: Every day | ORAL | 0 refills | Status: DC

## 2016-08-23 MED ORDER — ATORVASTATIN CALCIUM 10 MG PO TABS
10.0000 mg | ORAL_TABLET | Freq: Every day | ORAL | Status: DC
  Administered 2016-08-23: 10 mg via ORAL
  Filled 2016-08-23: qty 1

## 2016-08-23 MED ORDER — VALACYCLOVIR HCL 500 MG PO TABS
1000.0000 mg | ORAL_TABLET | Freq: Three times a day (TID) | ORAL | Status: DC
  Administered 2016-08-23: 1000 mg via ORAL
  Filled 2016-08-23: qty 2

## 2016-08-23 MED ORDER — VALACYCLOVIR HCL 1 G PO TABS
1000.0000 mg | ORAL_TABLET | Freq: Three times a day (TID) | ORAL | 0 refills | Status: DC
Start: 2016-08-23 — End: 2017-02-10

## 2016-08-23 MED ORDER — POLYVINYL ALCOHOL 1.4 % OP SOLN
1.0000 [drp] | OPHTHALMIC | Status: DC | PRN
  Filled 2016-08-23: qty 15

## 2016-08-23 MED ORDER — ARTIFICIAL TEARS OP OINT
TOPICAL_OINTMENT | Freq: Every day | OPHTHALMIC | Status: DC
  Filled 2016-08-23: qty 3.5

## 2016-08-23 MED ORDER — PREDNISONE 20 MG PO TABS
60.0000 mg | ORAL_TABLET | Freq: Every day | ORAL | Status: DC
  Administered 2016-08-23: 60 mg via ORAL
  Filled 2016-08-23: qty 3

## 2016-08-23 NOTE — Progress Notes (Signed)
STROKE TEAM PROGRESS NOTE   HISTORY OF PRESENT ILLNESS (per record) This is a 50 year old right-handed man who is brought in by EMS after developing left-sided facial droop and slurred speech earlier today. History is obtained directly from the patient. The case also discussed with the EMT who brought the patient into the emergency department. The patient is currently an inmate in jail.  The patient reports that he woke up at 530 this morning 08/22/2016 ([facility]KW) for breakfast and noticed that he was having trouble eating and swallowing because the left side of his mouth was not moving normally. He also noticed that he was having trouble closing his left eye. He states that he asked his CO if he could have his blood pressure checked then fell back to sleep. Per EMS, they were told by facility staff that the patient is currently kept on the medical unit and they last saw him normal at 1200. They noted him to have a left facial droop shortly before calling EMS this afternoon, unsure what time this was. CODE STROKE was called in the field and Dr. Shon Hale received the page at 1718. He met the patient on his arrival in the ED and he was taken for emergent CT of the head that did not show any acute abnormality. His exam showed a persistent mild left facial droop with NIHSS score of 1. Thrombolytics were not administered due to mild deficits with low NIHSS score.   Patient was not administered IV t-PA secondary to mild deficits and conflicting times last well. He was admitted for further evaluation and treatment.   SUBJECTIVE (INTERVAL HISTORY) His police office guard is at the bedside.  He stated that his left eye is dry with burning sensation. Left ear hyperacusis. Stated that taste are gone but not sure if on the left or both side. Obvious left peripheral VII palsy, MRI ruled out tumor.     OBJECTIVE Temp:  [97.5 F (36.4 C)-98.8 F (37.1 C)] 98 F (36.7 C) (08/30 0700) Pulse Rate:  [68-78] 74 (08/30  0700) Cardiac Rhythm: Sinus tachycardia (08/30 1030) Resp:  [14-26] 20 (08/30 0700) BP: (148-185)/(90-106) 185/90 (08/30 0700) SpO2:  [96 %-99 %] 98 % (08/30 0700) Weight:  [91 kg (200 lb 11.2 oz)-97.5 kg (215 lb)] 91 kg (200 lb 11.2 oz) (08/29 2115)  CBC:   Recent Labs Lab 08/22/16 1750 08/22/16 1805 08/23/16 0312  WBC 9.6  --  8.4  NEUTROABS 4.6  --   --   HGB 13.6 13.9 13.8  HCT 42.0 41.0 43.2  MCV 87.0  --  86.9  PLT 293  --  256    Basic Metabolic Panel:   Recent Labs Lab 08/22/16 1750 08/22/16 1805 08/23/16 0312  NA 138 140 141  K 3.8 4.0 3.9  CL 105 104 106  CO2 25  --  26  GLUCOSE 89 89 80  BUN _0 CREATININE 0.79 0.80 0.78  CALCIUM 9.3  --  9.3    Lipid Panel:     Component Value Date/Time   CHOL 181 08/23/2016 0312   TRIG 108 08/23/2016 0312   HDL 31 (L) 08/23/2016 0312   CHOLHDL 5.8 08/23/2016 0312   VLDL 22 08/23/2016 0312   LDLCALC 128 (H) 08/23/2016 0312   HgbA1c: No results found for: HGBA1C Urine Drug Screen:     Component Value Date/Time   LABOPIA NONE DETECTED 08/22/2016 1929   COCAINSCRNUR NONE DETECTED 08/22/2016 1929   LABBENZ NONE DETECTED 08/22/2016 1929  AMPHETMU NONE DETECTED 08/22/2016 1929   THCU NONE DETECTED 08/22/2016 1929   LABBARB NONE DETECTED 08/22/2016 1929      IMAGING I have personally reviewed the radiological images below and agree with the radiology interpretations.  Dg Lumbar Spine 2-3 Views 08/23/2016  Anterior wedge compression fracture at L1 with slightly more wedging compared to prior study. No new fracture. No spondylolisthesis. 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 There is aortic atherosclerosis.   Mri and Mra Brain Wo Contrast 08/23/2016 1.  No acute intracranial abnormality. 2. Moderate for age but nonspecific cerebral white matter signal changes. There is evidence of a chronic micro-hemorrhage in the left centrum semiovale, thus these white matter changes may reflect chronic small  vessel disease. 3.  Negative intracranial MRA.  Mildly tortuous basilar artery.   Ct Head Code Stroke W/o Cm 08/22/2016 1. No acute intracranial infarct or other abnormality identified. 2. ASPECTS is 10 3. Mild chronic microvascular ischemic disease, stable.   TTE  06/2013 Left ventricle: The cavity size was normal. Systolic function was   normal. The estimated ejection fraction was in the range of 60%   to 65%. Wall motion was normal; there were no regional wall   motion abnormalities. Left ventricular diastolic function   parameters were normal. - Left atrium: The atrium was mildly dilated. - Atrial septum: No defect or patent foramen ovale was identified. - Impressions: Normal GLS - 17.1. Impressions: - Normal GLS - 17.1.   PHYSICAL EXAM  Temp:  [97.5 F (36.4 C)-98 F (36.7 C)] 97.9 F (36.6 C) (08/30 1430) Pulse Rate:  [74-82] 82 (08/30 1430) Resp:  [20] 20 (08/30 1430) BP: (131-185)/(90-93) 131/93 (08/30 1430) SpO2:  [97 %-98 %] 97 % (08/30 1430)  General - Well nourished, well developed, in no apparent distress.  Ophthalmologic - Fundi not visualized due to noncooperation.  Cardiovascular - Regular rate and rhythm.  Mental Status -  Level of arousal and orientation to time, place, and person were intact. Language including expression, naming, repetition, comprehension was assessed and found intact. Fund of Knowledge was assessed and was intact.  Cranial Nerves II - XII - II - Visual field intact OU. III, IV, VI - Extraocular movements intact. V - Facial sensation intact bilaterally. VII - Facial movement exam showed peripheral CN VII palsy. VIII - Hearing & vestibular intact bilaterally. X - Palate elevates symmetrically. XI - Chin turning & shoulder shrug intact bilaterally. XII - Tongue protrusion intact.  Motor Strength - The patient's strength was normal in all extremities and pronator drift was absent.  Bulk was normal and fasciculations were absent.    Motor Tone - Muscle tone was assessed at the neck and appendages and was normal.  Reflexes - The patient's reflexes were 1+ in all extremities and he had no pathological reflexes.  Sensory - Light touch, temperature/pinprick were assessed and were symmetrical.    Coordination - The patient had normal movements in the hands with no ataxia or dysmetria.  Tremor was absent.  Gait and Station - deferred due to ankle cuffs.    ASSESSMENT/PLAN Jacob [name] is a 50 y.o. male with history of HTN, cigarette smoker and schizophrenia presenting with slurred speech, L facial droop, unable to close L eye.   Bell's Palsy  MRI  No acute stroke or brain tumor  MRA  Unremarkable   2D Echo  07/17/2016 EF 60-65, No SOE  LDL 128  HgbA1c pending  Lovenox 40 mg sq daily for  VTE prophylaxis Diet Heart Room service appropriate? Yes; Fluid consistency: Thin  No antithrombotic prior to admission, now on aspirin 325 mg daily. Discontinue ASA as no acute stroke.   Valacyclovir 1000 mg tid x 7 days  Prednisone 60 mg daily x 7 days  artifical teas to L eye prn  lacrilube ointment to L eye Qhs  Therapy recommendations:  No therapy needs  Disposition:  [facility] (admitted from [facility])  Hypertensive Urgency  BP 180/100 on arrival in setting of neurologic symptoms  Improving BP, 160-180s Long-term BP goal normotensive  Hyperlipidemia  Home meds:  No statin  LDL 128  New lipitor 80 mg added this admission. As no stroke, ok to decrease to 10 mg daily  Continue statin at discharge  Other Stroke Risk Factors  Cigarette smoker, advised to stop smoking  ETOH use, advised to drink no more than 2 drink(s) a day  Overweight , Body mass index is 26.48 kg/m., recommend weight loss, diet and exercise as appropriate   Other Active Problems  Schizophrenia  Chronic low back pain  Stroke team will sign off, no follow up indicated  Hospital  day # 0  Jacob Hawking, MD PhD Stroke Neurology 08/23/2016 10:35 PM   To contact Stroke Continuity provider, please refer to [email or web address] After hours, contact General Neurology

## 2016-08-23 NOTE — Care Management Note (Signed)
Case Management Note  Patient Details  Name: Jacob [name] MRN: [id number] Date of Birth: 13-Jul-1966  Subjective/Objective:    Pt in to r/o CVA. MRI negative. He is from jail.                 Action/Plan: Awaiting PT/OT recommendations. CM following for discharge needs.   Expected Discharge Date:  08/24/16               Expected Discharge Plan:  [facility]  In-House Referral:     Discharge planning Services     Post Acute Care Choice:    Choice offered to:     DME Arranged:    DME Agency:     HH Arranged:    HH Agency:     Status of Service:  In process, will continue to follow  If discussed at Long Length of Stay Meetings, dates discussed:    Additional Comments:  Pollie Friar, RN 08/23/2016, 11:29 AM

## 2016-08-23 NOTE — Discharge Summary (Addendum)
Physician Discharge Summary  Jacob [name]:[id number] DOB: 1966/01/24  PCP: Harvie Junior, MD  Admit date: 08/22/2016 Discharge date: 08/23/2016  Admitted From: [facility]. Disposition:  [facility].  Recommendations for Outpatient Follow-up:  1. MD at [name], in 4 days re post hospital follow up regarding Bell's palsy and BP management. 2. Psychiatrist at [facility], re review of psychiatric medications at discharge from hospital. 3. Dr. York Ram, PCP, upon DC home. Please follow HbA1c results that were sent from the hospital. [place], Upon DC home.  Home Health: None Equipment/Devices: None    Discharge Condition: Improved and stable.  CODE STATUS: Full  Diet recommendation: Heart Healthy diet.  Discharge Diagnoses:  Principal Problem:   Cerebral thrombosis with cerebral infarction Active Problems:   Essential hypertension   LOW BACK PAIN   Schizophrenia, unspecified type ([place])   Facial droop   Stroke (cerebrum) ([place])   Brief/Interim Summary: 50 -year-old male, currently incarcerated in [facility] since 07/31/2016, PMH of bipolar disorder, chronic pain, gout, HTN,recent admission last month for syncope and dehydration, was brought from the detention center on 08/22/16 with complaints of left-sided facial weakness and slurred speech. As per report, patient woke up at 5:30 AM on 08/22/16 and noticed that he was having trouble and swallowing because the the side of his mouth was not moving normally. He also noticed that he was having trouble closing his left eye. He stated that he asked his CO if he could have his blood pressure checked then fell back to sleep. Per EMS, they were told by facility staff that the patient was currently kept on the medical unit and they last saw him normal at 1200 hrs. They noted him to have a left facial droop shortly  before calling EMS on that afternoon, unsure what time this was. Code stroke was called in the field and patient was met by neurologist in the ED. Emergent CT head showed no acute abnormality. He was admitted for possible stroke evaluation but has left seventh nerve palsy/possible Bell's palsy.  Left seventh nerve palsy/possible Bell's palsy - History as indicated above - MRI and MRA head unremarkable and detailed report as below. - Stroke MD followed up today and agreed that patient had Bell's palsy. Remainder of stroke workup was discontinued. Patient was started on Valtrex and high-dose prednisone 7 days, artificial tears and Lacri-Lube ointment to left eye and cleared to DC to the detention center.  Essential hypertension - BP 180/100 on arrival. Patient has not been receiving antihypertensives at the detention center. Resume prior antihypertensives and follow up.  Hyperlipidemia - LDL 128. Started atorvastatin 10 MG daily. Outpatient follow-up.  Tobacco abuse - Cessation counseled  Bipolar disorder/schizophrenia - Discussed with police officer guarding the patient who indicated that a physician and mental health officer are assigned to the detention center and periodically follow patients. As per home medication reconciliation performed on admission, patient has not been getting several of the psychiatric medications (amantadine, trazodone, BuSpar) that were prescribed by [facility] in the past.  - recommend review of his psychiatric medications by the mental health officer at the detention center. This was discussed with the officer.  Chronic back pain/L1 fracture - Patient gives history of past falls and Lumbar fracture. Pain seems to be adequately controlled and patient was able to work with PT and OT who do not have any recommendations for discharge. - lumbar spine x-ray results  are as below. Continue symptomatic treatment.     Discharge  Instructions  Discharge Instructions    Call MD for:    Complete by:  As directed   Worsening facial weakness.   Diet - low sodium heart healthy    Complete by:  As directed   Increase activity slowly    Complete by:  As directed       Medication List    STOP taking these medications   amantadine 100 MG capsule Commonly known as:  SYMMETREL   busPIRone 10 MG tablet Commonly known as:  BUSPAR   diclofenac sodium 1 % Gel Commonly known as:  VOLTAREN   fluticasone 50 MCG/ACT nasal spray Commonly known as:  FLONASE   loratadine 10 MG tablet Commonly known as:  CLARITIN   traZODone 100 MG tablet Commonly known as:  DESYREL     TAKE these medications   artificial tears Oint ophthalmic ointment Place into the left eye at bedtime.   atorvastatin 10 MG tablet Commonly known as:  LIPITOR Take 1 tablet (10 mg total) by mouth daily at 6 PM.   benztropine 1 MG tablet Commonly known as:  COGENTIN Take 1 tablet (1 mg total) by mouth 4 (four) times daily.   FLUoxetine 40 MG capsule Commonly known as:  PROZAC Take 40 mg by mouth every evening. What changed:  Another medication with the same name was removed. Continue taking this medication, and follow the directions you see here.   ibuprofen 600 MG tablet Commonly known as:  ADVIL,MOTRIN Take 600 mg by mouth 2 (two) times daily.   lisinopril-hydrochlorothiazide 10-12.5 MG tablet Commonly known as:  PRINZIDE,ZESTORETIC Take 1 tablet by mouth daily.   polyvinyl alcohol 1.4 % ophthalmic solution Commonly known as:  LIQUIFILM TEARS Place 1 drop into the left eye as needed for dry eyes.   predniSONE 20 MG tablet Commonly known as:  DELTASONE Take 3 tablets (60 mg total) by mouth daily with breakfast. Start taking on:  08/24/2016   risperidone 4 MG tablet Commonly known as:  RISPERDAL Take 4 mg by mouth every evening. What changed:  Another medication with the same name was removed. Continue taking this medication, and  follow the directions you see here.   valACYclovir 1000 MG tablet Commonly known as:  VALTREX Take 1 tablet (1,000 mg total) by mouth 3 (three) times daily.      Follow-up Information    Harvie Junior, MD. Schedule an appointment as soon as possible for a visit today.   Specialty:  Family Medicine Why:  upon discharge home. Contact information: [place] [address] [phone]        MD at the [facility]. Schedule an appointment as soon as possible for a visit in 4 day(s).   Why:  Post hospital follow up regarding Bell's palsy and BP management,       Psychiatrist at [facility]. Schedule an appointment as soon as possible for a visit in 4 day(s).   Why:  Review psychiatry medications at discharge from hospital.       [facility]. Schedule an appointment as soon as possible for a visit today.   Specialty:  Behavioral Health Why:  Upon discharge home. Contact information: [place] [address] [phone]          Allergies  Allergen Reactions  . Abilify [Aripiprazole] Other (See Comments)    Per patient "black outs" not sure what happens  .  Cogentin [Benztropine] Other (See Comments)    Dehydration    Consultations:  Neurology   Procedures/Studies: Dg Lumbar Spine 2-3 Views  Result Date: 08/23/2016 CLINICAL DATA:  Chronic lumbago EXAM: LUMBAR SPINE - 2-3 VIEW COMPARISON:  May 19, 2016 FINDINGS: Frontal, lateral, and spot lumbosacral lateral images were obtained. There are 5 non-rib-bearing lumbar type vertebral bodies. There is evidence of a prior anterior wedge compression fracture at L1. There is perhaps minimally more wedging currently than on the prior study. There is no new fracture. No spondylolisthesis. There is moderate disc space narrowing at L4-5 and L5-S1 with vacuum phenomenon at L5-S1. No erosive change. There are foci of atherosclerotic calcification in the aorta. IMPRESSION:  Anterior wedge compression fracture at L1 with slightly more wedging compared to prior study. No new fracture. No spondylolisthesis. 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 There is aortic atherosclerosis. Electronically Signed   By: Lowella Grip III M.D.   On: 08/23/2016 10:04   Mr Brain Wo Contrast  Result Date: 08/23/2016 CLINICAL DATA:  50 year old male with sudden onset left facial droop and slurred speech on 08/22/2016. Initial encounter. EXAM: MRI HEAD WITHOUT CONTRAST MRA HEAD WITHOUT CONTRAST TECHNIQUE: Multiplanar, multiecho pulse sequences of the brain and surrounding structures were obtained without intravenous contrast. Angiographic images of the head were obtained using MRA technique without contrast. COMPARISON:  Head CT without contrast 08/22/2016 and earlier. FINDINGS: MRI HEAD FINDINGS No restricted diffusion or evidence of acute infarction. Major intracranial vascular flow voids are preserved. No midline shift, mass effect, evidence of mass lesion, ventriculomegaly, extra-axial collection or acute intracranial hemorrhage. Cervicomedullary junction and pituitary are within normal limits. Negative visualized cervical spine. Scattered and patchy bilateral cerebral white matter T2 and FLAIR hyperintensity. The pattern is nonspecific. Periatrial white matter is most have Lee affected. No cortical encephalomalacia. There is evidence of a chronic micro hemorrhage in the left centrum semiovale on series 10, image 66. No other chronic cerebral blood products. Deep gray matter nuclei, brainstem, and cerebellum are normal. Visible internal auditory structures appear normal. Mastoids are clear. Stylomastoid foramina appear normal. Parotid glands appear normal. Mild fluid and mucosal thickening in the left sphenoid sinus. Other paranasal sinuses are clear. Negative orbit and scalp soft tissues. Normal bone marrow signal. MRA HEAD FINDINGS Antegrade flow in the posterior circulation. Mildly  tortuous codominant distal vertebral arteries. Normal vertebrobasilar junction. Tortuous basilar artery without stenosis. AICA, SCA, and PCA origins are normal. Posterior communicating arteries are diminutive or absent. Normal PCA branches. Antegrade flow in both ICA siphons. No siphon stenosis. Normal ophthalmic artery origins. Patent carotid termini. Normal MCA and ACA origins. Diminutive or absent anterior communicating artery. Visualized ACA branches are within normal limits. MCA M1 segments and bifurcations are patent. Visualized left MCA branches are within normal limits. Visualized right MCA branches are within normal limits. IMPRESSION: 1.  No acute intracranial abnormality. 2. Moderate for age but nonspecific cerebral white matter signal changes. There is evidence of a chronic micro-hemorrhage in the left centrum semiovale, thus these white matter changes may reflect chronic small vessel disease. 3.  Negative intracranial MRA.  Mildly tortuous basilar artery. Electronically Signed   By: Genevie Ann M.D.   On: 08/23/2016 10:04   Mr Lovenia Kim  Result Date: 08/23/2016 CLINICAL DATA:  50 year old male with sudden onset left facial droop and slurred speech on 08/22/2016. Initial encounter. EXAM: MRI HEAD WITHOUT CONTRAST MRA HEAD WITHOUT CONTRAST TECHNIQUE: Multiplanar, multiecho pulse sequences of the brain and surrounding structures were  obtained without intravenous contrast. Angiographic images of the head were obtained using MRA technique without contrast. COMPARISON:  Head CT without contrast 08/22/2016 and earlier. FINDINGS: MRI HEAD FINDINGS No restricted diffusion or evidence of acute infarction. Major intracranial vascular flow voids are preserved. No midline shift, mass effect, evidence of mass lesion, ventriculomegaly, extra-axial collection or acute intracranial hemorrhage. Cervicomedullary junction and pituitary are within normal limits. Negative visualized cervical spine. Scattered and patchy  bilateral cerebral white matter T2 and FLAIR hyperintensity. The pattern is nonspecific. Periatrial white matter is most have Lee affected. No cortical encephalomalacia. There is evidence of a chronic micro hemorrhage in the left centrum semiovale on series 10, image 66. No other chronic cerebral blood products. Deep gray matter nuclei, brainstem, and cerebellum are normal. Visible internal auditory structures appear normal. Mastoids are clear. Stylomastoid foramina appear normal. Parotid glands appear normal. Mild fluid and mucosal thickening in the left sphenoid sinus. Other paranasal sinuses are clear. Negative orbit and scalp soft tissues. Normal bone marrow signal. MRA HEAD FINDINGS Antegrade flow in the posterior circulation. Mildly tortuous codominant distal vertebral arteries. Normal vertebrobasilar junction. Tortuous basilar artery without stenosis. AICA, SCA, and PCA origins are normal. Posterior communicating arteries are diminutive or absent. Normal PCA branches. Antegrade flow in both ICA siphons. No siphon stenosis. Normal ophthalmic artery origins. Patent carotid termini. Normal MCA and ACA origins. Diminutive or absent anterior communicating artery. Visualized ACA branches are within normal limits. MCA M1 segments and bifurcations are patent. Visualized left MCA branches are within normal limits. Visualized right MCA branches are within normal limits. IMPRESSION: 1.  No acute intracranial abnormality. 2. Moderate for age but nonspecific cerebral white matter signal changes. There is evidence of a chronic micro-hemorrhage in the left centrum semiovale, thus these white matter changes may reflect chronic small vessel disease. 3.  Negative intracranial MRA.  Mildly tortuous basilar artery. Electronically Signed   By: Genevie Ann M.D.   On: 08/23/2016 10:04   Ct Head Code Stroke W/o Cm  Result Date: 08/22/2016 CLINICAL DATA:  Code stroke. Initial evaluation for acute left-sided facial droop. Slurred  speech. EXAM: CT HEAD WITHOUT CONTRAST TECHNIQUE: Contiguous axial images were obtained from the base of the skull through the vertex without intravenous contrast. COMPARISON:  Prior CT from 10/29/2014. FINDINGS: Stable cerebral volume. Mild chronic microvascular ischemic disease, unchanged. Gray-white matter differentiation maintained. Deep gray nuclei maintained. No insular ribbon sign. No evidence for acute large vessel territory infarct. No hyperdense vessel. No acute intracranial hemorrhage. No mass lesion, midline shift, or mass effect. No hydrocephalus. No extra-axial fluid collection. Scalp soft tissues demonstrate no acute abnormality. No acute abnormality about the globes and orbits. Scattered mucosal thickening within the left sphenoid sinus. Paranasal sinuses are otherwise clear. No mastoid effusion. Calvarium intact. ASPECTS [facility] Stroke Program Early CT Score) - Ganglionic level infarction (caudate, lentiform nuclei, internal capsule, insula, M1-M3 cortex): 7 - Supraganglionic infarction (M4-M6 cortex): 3 Total score (0-10 with 10 being normal): 10 IMPRESSION: 1. No acute intracranial infarct or other abnormality identified. 2. ASPECTS is 10 3. Mild chronic microvascular ischemic disease, stable. Critical Value/emergent results were called by telephone at the time of interpretation on 08/22/2016 at 6:02 pm to Dr. Shon Hale, who verbally acknowledged these results. Electronically Signed   By: Jeannine Boga M.D.   On: 08/22/2016 18:02      Subjective: Left facial weakness is better than on admission. Tolerated diet and no reported swallowing difficulties.  Discharge Exam:  Vitals:   08/23/16 0300  08/23/16 0500 08/23/16 0700 08/23/16 1430  BP: (!) 168/90 (!) 172/90 (!) 185/90 (!) 131/93  Pulse: 76 74 74 82  Resp: '20 20 20 20  ' Temp: 98 F (36.7 C) 98 F (36.7 C) 98 F (36.7 C) 97.9 F (36.6 C)  TempSrc: Oral Oral Oral Oral  SpO2: 98% 98% 98% 97%  Weight:      Height:         General: pleasant young male, moderately built and nourished, lying comfortably supine in bed. Legs shackled to bed. [employer] officer at bedside. Cardiovascular: S1 & S2 heard, RRR, S1/S2 +. No murmurs, rubs, gallops or clicks. No JVD or pedal edema. Telemetry: Sinus rhythm Respiratory: Clear to auscultation without wheezing, rhonchi or crackles. No increased work of breathing. Abdominal:  Non distended, non tender & soft. No organomegaly or masses appreciated. Normal bowel sounds heard. CNS: Alert and oriented. Findings consistent with left LMN seventh nerve weakness. No other cranial nerve deficits. Extremities: no edema, no cyanosis. Symmetric grade 5 x 5 power.    The results of significant diagnostics from this hospitalization (including imaging, microbiology, ancillary and laboratory) are listed below for reference.     Microbiology: No results found for this or any previous visit (from the past 240 hour(s)).   Labs: BNP (last 3 results) No results for input(s): BNP in the last 8760 hours. Basic Metabolic Panel:  Recent Labs Lab 08/22/16 1750 08/22/16 1805 08/23/16 0312  NA 138 140 141  K 3.8 4.0 3.9  CL 105 104 106  CO2 25  --  26  GLUCOSE 89 89 80  BUN '7 8 6  ' CREATININE 0.79 0.80 0.78  CALCIUM 9.3  --  9.3   Liver Function Tests:  Recent Labs Lab 08/22/16 1750 08/23/16 0312  AST 17 18  ALT 22 22  ALKPHOS 70 70  BILITOT 0.4 0.5  PROT 6.6 6.3*  ALBUMIN 3.6 3.4*   No results for input(s): LIPASE, AMYLASE in the last 168 hours. No results for input(s): AMMONIA in the last 168 hours. CBC:  Recent Labs Lab 08/22/16 1750 08/22/16 1805 08/23/16 0312  WBC 9.6  --  8.4  NEUTROABS 4.6  --   --   HGB 13.6 13.9 13.8  HCT 42.0 41.0 43.2  MCV 87.0  --  86.9  PLT 293  --  276   Cardiac Enzymes:  Recent Labs Lab 08/22/16 2218 08/23/16 0312 08/23/16 1056  TROPONINI <0.03 <0.03 <0.03   BNP: Invalid input(s): POCBNP CBG:  Recent Labs Lab  08/23/16 0749  GLUCAP 102*   D-Dimer No results for input(s): DDIMER in the last 72 hours. Hgb A1c No results for input(s): HGBA1C in the last 72 hours. Lipid Profile  Recent Labs  08/23/16 0312  CHOL 181  HDL 31*  LDLCALC 128*  TRIG 108  CHOLHDL 5.8      Time coordinating discharge: Over 30 minutes  SIGNED:  Vernell Leep, MD, FACP, FHM. [facility] Pager [phone]  If 7PM-7AM, please contact night-coverage [email or web address] Password [email or web address] 08/23/2016, 6:03 PM

## 2016-08-23 NOTE — Progress Notes (Signed)
Dc instructions given to pt at this time.  Pt verbalized understanding of all instructions.  No s/s of any acute distress.  Prescriptions and AVS given to officer.

## 2016-08-23 NOTE — Progress Notes (Deleted)
Seizure x 2 along with increased diaphoresis & neuro storm.  Ativan IV x2 with relief.  Explanations to patient's family re: seizure precautions, neuro storm & goals for care.  Suctioned x 3 with tan, copious sputum, cough strong.  Bath & bed change x 2 this shift to maintain dryness & comfort.
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
## 2016-08-23 NOTE — Progress Notes (Signed)
OT Cancellation Note  Patient Details Name: JEREMAIH TILLEY MRN: [id number] DOB: [birth year]   Cancelled Treatment:    Reason Eval/Treat Not Completed: Patient at procedure or test/ unavailable (MRI). Will follow up for OT eval as time allows.  Binnie Kand M.S., OTR/L Pager: [phone]  08/23/2016, 8:26 AM
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
## 2016-08-23 NOTE — Care Management Note (Signed)
Case Management Note  Patient Details  Name: Jacob [name] MRN: [id number] Date of Birth: Jun 01, 1966  Subjective/Objective:                    Action/Plan: Pt discharging back to jail today. MD to print paper prescriptions for the patient and bedside RN to give them to the guard at the bedside. RN aware and knows she will need to give the guard notice of when d/c goes in so he can arrange transport.   Expected Discharge Date:  08/24/16               Expected Discharge Plan:  Corrections Facility  In-House Referral:     Discharge planning Services     Post Acute Care Choice:    Choice offered to:     DME Arranged:    DME Agency:     HH Arranged:    HH Agency:     Status of Service:  In process, will continue to follow  If discussed at Long Length of Stay Meetings, dates discussed:    Additional Comments:  Pollie Friar, RN 08/23/2016, 4:32 PM

## 2016-08-23 NOTE — Discharge Instructions (Signed)
Bell Palsy °Bell palsy is a condition in which the muscles on one side of the face become paralyzed. This often causes one side of the face to droop. It is a common condition and most people recover completely. °RISK FACTORS °Risk factors for Bell palsy include: °· Pregnancy. °· Diabetes. °· An infection by a virus, such as infections that cause cold sores. °CAUSES  °Bell palsy is caused by damage to or inflammation of a nerve in your face. It is unclear why this happens, but an infection by a virus may lead to it. Most of the time the reason it happens is unknown. °SIGNS AND SYMPTOMS  °Symptoms can range from mild to severe and can take place over a number of hours. Symptoms may include: °· Being unable to: °¨ Raise one or both eyebrows. °¨ Close one or both eyes. °¨ Feel parts of your face (facial numbness). °· Drooping of the eyelid and corner of the mouth. °· Weakness in the face. °· Paralysis of half your face. °· Loss of taste. °· Sensitivity to loud noises. °· Difficulty chewing. °· Tearing up of the affected eye. °· Dryness in the affected eye. °· Drooling. °· Pain behind one ear. °DIAGNOSIS  °Diagnosis of Bell palsy may include: °· A medical history and physical exam. °· An MRI. °· A CT scan. °· Electromyography (EMG). This is a test that checks how your nerves are working. °TREATMENT  °Treatment may include antiviral medicine to help shorten the length of the condition. Sometimes treatment is not needed and the symptoms go away on their own. °HOME CARE INSTRUCTIONS  °· Take medicines only as directed by your health care provider. °· Do facial massages and exercises as directed by your health care provider. °· If your eye is affected: °¨ Use moisturizing eye drops to prevent drying of your eye as directed by your health care provider. °¨ Protect your eye as directed by your health care provider. °SEEK MEDICAL CARE IF: °· Your symptoms do not get better or get worse. °· You are drooling. °· Your eye is red,  irritated, or hurts. °SEEK IMMEDIATE MEDICAL CARE IF:  °· Another part of your body feels weak or numb. °· You have difficulty swallowing. °· You have a fever along with symptoms of Bell palsy. °· You develop neck pain. °MAKE SURE YOU:  °· Understand these instructions. °· Will watch your condition. °· Will get help right away if you are not doing well or get worse. °  °This information is not intended to replace advice given to you by your health care provider. Make sure you discuss any questions you have with your health care provider. °  °Document Released: 12/11/2005 Document Revised: 09/01/2015 Document Reviewed: 03/20/2014 °Elsevier Interactive Patient Education ©2016 [employer] ° °

## 2016-08-23 NOTE — Care Management Obs Status (Signed)
[facility] NOTIFICATION   Patient Details  Name: Jacob [name] MRN: [id number] Date of Birth: Jul 22, 1966   Medicare Observation Status Notification Given:  Yes (MRI negative)    Pollie Friar, RN 08/23/2016, 11:03 AM

## 2016-08-31 ENCOUNTER — Emergency Department (HOSPITAL_COMMUNITY)
Admission: EM | Admit: 2016-08-31 | Discharge: 2016-08-31 | Disposition: A | Discharge status: Home / Self Care | Payer: Medicare Other | Attending: Emergency Medicine | Admitting: Emergency Medicine

## 2016-08-31 ENCOUNTER — Encounter (HOSPITAL_COMMUNITY): Payer: Self-pay | Admitting: Emergency Medicine

## 2016-08-31 ENCOUNTER — Emergency Department (HOSPITAL_COMMUNITY): Payer: Medicare Other

## 2016-08-31 DIAGNOSIS — Z8673 Personal history of transient ischemic attack (TIA), and cerebral infarction without residual deficits: Secondary | ICD-10-CM | POA: Insufficient documentation

## 2016-08-31 DIAGNOSIS — F250 Schizoaffective disorder, bipolar type: Secondary | ICD-10-CM | POA: Diagnosis not present

## 2016-08-31 DIAGNOSIS — R7989 Other specified abnormal findings of blood chemistry: Secondary | ICD-10-CM

## 2016-08-31 DIAGNOSIS — I959 Hypotension, unspecified: Secondary | ICD-10-CM | POA: Diagnosis not present

## 2016-08-31 DIAGNOSIS — R791 Abnormal coagulation profile: Secondary | ICD-10-CM | POA: Insufficient documentation

## 2016-08-31 DIAGNOSIS — R51 Headache: Secondary | ICD-10-CM | POA: Insufficient documentation

## 2016-08-31 DIAGNOSIS — Z79899 Other long term (current) drug therapy: Secondary | ICD-10-CM | POA: Insufficient documentation

## 2016-08-31 DIAGNOSIS — F17210 Nicotine dependence, cigarettes, uncomplicated: Secondary | ICD-10-CM | POA: Diagnosis not present

## 2016-08-31 DIAGNOSIS — E860 Dehydration: Secondary | ICD-10-CM

## 2016-08-31 DIAGNOSIS — R519 Headache, unspecified: Secondary | ICD-10-CM

## 2016-08-31 LAB — DIFFERENTIAL
Basophils Absolute: 0 10*3/uL (ref 0.0–0.1)
Basophils Relative: 0 %
Eosinophils Absolute: 0 10*3/uL (ref 0.0–0.7)
Eosinophils Relative: 0 %
Lymphocytes Relative: 16 %
Lymphs Abs: 2.9 10*3/uL (ref 0.7–4.0)
Monocytes Absolute: 0.8 10*3/uL (ref 0.1–1.0)
Monocytes Relative: 5 %
Neutro Abs: 14.8 10*3/uL — ABNORMAL HIGH (ref 1.7–7.7)
Neutrophils Relative %: 79 %

## 2016-08-31 LAB — COMPREHENSIVE METABOLIC PANEL
ALT: 35 U/L (ref 17–63)
AST: 20 U/L (ref 15–41)
Albumin: 3.6 g/dL (ref 3.5–5.0)
Alkaline Phosphatase: 73 U/L (ref 38–126)
Anion gap: 9 (ref 5–15)
BUN: 22 mg/dL — ABNORMAL HIGH (ref 6–20)
CO2: 26 mmol/L (ref 22–32)
Calcium: 9.5 mg/dL (ref 8.9–10.3)
Chloride: 103 mmol/L (ref 101–111)
Creatinine, Ser: 1.82 mg/dL — ABNORMAL HIGH (ref 0.61–1.24)
GFR calc Af Amer: 49 mL/min — ABNORMAL LOW (ref >60)
GFR calc non Af Amer: 42 mL/min — ABNORMAL LOW (ref >60)
Glucose, Bld: 120 mg/dL — ABNORMAL HIGH (ref 65–99)
Potassium: 4.1 mmol/L (ref 3.5–5.1)
Sodium: 138 mmol/L (ref 135–145)
Total Bilirubin: 0.4 mg/dL (ref 0.3–1.2)
Total Protein: 6.7 g/dL (ref 6.5–8.1)

## 2016-08-31 LAB — I-STAT CHEM 8, ED
BUN: 26 mg/dL — ABNORMAL HIGH (ref 6–20)
Calcium, Ion: 1.11 mmol/L — ABNORMAL LOW (ref 1.15–1.40)
Chloride: 101 mmol/L (ref 101–111)
Creatinine, Ser: 1.5 mg/dL — ABNORMAL HIGH (ref 0.61–1.24)
Glucose, Bld: 119 mg/dL — ABNORMAL HIGH (ref 65–99)
HCT: 42 % (ref 39.0–52.0)
Hemoglobin: 14.3 g/dL (ref 13.0–17.0)
Potassium: 3.9 mmol/L (ref 3.5–5.1)
Sodium: 139 mmol/L (ref 135–145)
TCO2: 29 mmol/L (ref 0–100)

## 2016-08-31 LAB — CBC
HCT: 40.9 % (ref 39.0–52.0)
Hemoglobin: 13.3 g/dL (ref 13.0–17.0)
MCH: 27.9 pg (ref 26.0–34.0)
MCHC: 32.5 g/dL (ref 30.0–36.0)
MCV: 85.9 fL (ref 78.0–100.0)
Platelets: 295 10*3/uL (ref 150–400)
RBC: 4.76 MIL/uL (ref 4.22–5.81)
RDW: 13.2 % (ref 11.5–15.5)
WBC: 18.6 10*3/uL — ABNORMAL HIGH (ref 4.0–10.5)

## 2016-08-31 LAB — PROTIME-INR
INR: 0.96
Prothrombin Time: 12.8 seconds (ref 11.4–15.2)

## 2016-08-31 LAB — I-STAT TROPONIN, ED: Troponin i, poc: 0 ng/mL (ref 0.00–0.08)

## 2016-08-31 LAB — APTT: aPTT: 27 seconds (ref 24–36)

## 2016-08-31 MED ORDER — ACETAMINOPHEN 500 MG PO TABS
1000.0000 mg | ORAL_TABLET | Freq: Once | ORAL | Status: AC
  Administered 2016-08-31: 1000 mg via ORAL
  Filled 2016-08-31: qty 2

## 2016-08-31 MED ORDER — SODIUM CHLORIDE 0.9 % IV BOLUS (SEPSIS)
1000.0000 mL | INTRAVENOUS | Status: AC
  Administered 2016-08-31: 1000 mL via INTRAVENOUS

## 2016-08-31 NOTE — ED Triage Notes (Signed)
Pt states he went to clinic today to receive his resperdol injections. While there, he was told his BP was low and he needed to go to ED. Pt cannot recall how low. Pt states he had a stroke 6 days ago. Pt states he has had a headache for the past few days and has felt weak.

## 2016-08-31 NOTE — ED Notes (Signed)
updated
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
## 2016-08-31 NOTE — ED Notes (Signed)
EDP at bedside  

## 2016-08-31 NOTE — ED Notes (Signed)
Pt checking in because he states, "he left for 1 hour, has not been here to hear his name being called".

## 2016-08-31 NOTE — ED Triage Notes (Signed)
Pt states he has been feeling dizzy for months. Pt states he has not felt himself since he had a stroke. No new stroke like symptoms today.

## 2016-08-31 NOTE — Discharge Instructions (Signed)
1. Medications: Do not take your blood pressure medication tomorrow till you have had your blood pressure rechecked and to have confirmed with your doctor at it is okay to resume, usual home medications 2. Treatment: rest, drink plenty of fluids,  3. Follow Up: Please followup with your primary doctor in 1 days for discussion of your diagnoses and further evaluation after today's visit; if you do not have a primary care doctor use the resource guide provided to find one; Please return to the ER for return of symptoms or other concerns

## 2016-09-06 ENCOUNTER — Other Ambulatory Visit: Payer: Self-pay

## 2016-09-06 ENCOUNTER — Emergency Department (HOSPITAL_COMMUNITY)
Admission: EM | Admit: 2016-09-06 | Discharge: 2016-09-06 | Disposition: A | Discharge status: Home / Self Care | Payer: Medicare Other | Attending: Emergency Medicine | Admitting: Emergency Medicine

## 2016-09-06 ENCOUNTER — Encounter (HOSPITAL_COMMUNITY): Payer: Self-pay | Admitting: *Deleted

## 2016-09-06 ENCOUNTER — Emergency Department (HOSPITAL_COMMUNITY): Payer: Medicare Other

## 2016-09-06 DIAGNOSIS — I10 Essential (primary) hypertension: Secondary | ICD-10-CM | POA: Diagnosis not present

## 2016-09-06 DIAGNOSIS — R079 Chest pain, unspecified: Secondary | ICD-10-CM | POA: Diagnosis not present

## 2016-09-06 DIAGNOSIS — Z8673 Personal history of transient ischemic attack (TIA), and cerebral infarction without residual deficits: Secondary | ICD-10-CM | POA: Diagnosis not present

## 2016-09-06 DIAGNOSIS — F17210 Nicotine dependence, cigarettes, uncomplicated: Secondary | ICD-10-CM | POA: Diagnosis not present

## 2016-09-06 DIAGNOSIS — Z79899 Other long term (current) drug therapy: Secondary | ICD-10-CM | POA: Diagnosis not present

## 2016-09-06 DIAGNOSIS — R1013 Epigastric pain: Secondary | ICD-10-CM | POA: Diagnosis not present

## 2016-09-06 DIAGNOSIS — R208 Other disturbances of skin sensation: Secondary | ICD-10-CM | POA: Diagnosis not present

## 2016-09-06 DIAGNOSIS — R0789 Other chest pain: Secondary | ICD-10-CM | POA: Diagnosis not present

## 2016-09-06 LAB — CBC
HCT: 37 % — ABNORMAL LOW (ref 39.0–52.0)
Hemoglobin: 11.9 g/dL — ABNORMAL LOW (ref 13.0–17.0)
MCH: 27.6 pg (ref 26.0–34.0)
MCHC: 32.2 g/dL (ref 30.0–36.0)
MCV: 85.8 fL (ref 78.0–100.0)
Platelets: 291 10*3/uL (ref 150–400)
RBC: 4.31 MIL/uL (ref 4.22–5.81)
RDW: 13.3 % (ref 11.5–15.5)
WBC: 17.1 10*3/uL — ABNORMAL HIGH (ref 4.0–10.5)

## 2016-09-06 LAB — BASIC METABOLIC PANEL
Anion gap: 7 (ref 5–15)
BUN: 16 mg/dL (ref 6–20)
CO2: 26 mmol/L (ref 22–32)
Calcium: 8.9 mg/dL (ref 8.9–10.3)
Chloride: 103 mmol/L (ref 101–111)
Creatinine, Ser: 0.96 mg/dL (ref 0.61–1.24)
GFR calc Af Amer: >60 mL/min (ref >60)
GFR calc non Af Amer: >60 mL/min (ref >60)
Glucose, Bld: 84 mg/dL (ref 65–99)
Potassium: 4.3 mmol/L (ref 3.5–5.1)
Sodium: 136 mmol/L (ref 135–145)

## 2016-09-06 LAB — TROPONIN I: Troponin I: <0.03 ng/mL (ref <0.03)

## 2016-09-06 MED ORDER — HYDROCODONE-ACETAMINOPHEN 5-325 MG PO TABS
1.0000 | ORAL_TABLET | Freq: Once | ORAL | Status: AC
  Administered 2016-09-06: 1 via ORAL
  Filled 2016-09-06: qty 1

## 2016-09-06 MED ORDER — GI COCKTAIL ~~LOC~~
30.0000 mL | Freq: Once | ORAL | Status: AC
  Administered 2016-09-06: 30 mL via ORAL
  Filled 2016-09-06: qty 30

## 2016-09-06 NOTE — ED Notes (Signed)
The pt has been given gi cocktail  .  He has been given food to eat per his frequest  Pa reports it ok

## 2016-09-06 NOTE — ED Triage Notes (Signed)
The pt is c/o central chest pain for one week plus    Worse for 12 hours  He has vomited once iv by [facility] from home  He was given aspirin 325mg  and a sl nitro that dropped his bp  And it did not help his pain  He was given  219ml  Infused by ems for bp

## 2016-09-06 NOTE — ED Notes (Signed)
The pt reports that he has had chest pain for approx 12 hours he insists he has had a recent stroke.  When asked if he had been diagnosed with bells palsy he replied yes. Lt facial droop for 2 weeks  And his speech is affected also

## 2016-09-06 NOTE — Discharge Instructions (Signed)
Return here as needed. Follow up with your doctor. °
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
## 2016-09-19 ENCOUNTER — Encounter (HOSPITAL_COMMUNITY): Payer: Self-pay | Admitting: Emergency Medicine

## 2016-09-19 ENCOUNTER — Emergency Department (HOSPITAL_COMMUNITY): Payer: Medicare Other

## 2016-09-19 ENCOUNTER — Emergency Department (HOSPITAL_COMMUNITY)
Admission: EM | Admit: 2016-09-19 | Discharge: 2016-09-19 | Disposition: A | Discharge status: Home / Self Care | Payer: Medicare Other | Attending: Emergency Medicine | Admitting: Emergency Medicine

## 2016-09-19 DIAGNOSIS — S39012A Strain of muscle, fascia and tendon of lower back, initial encounter: Secondary | ICD-10-CM | POA: Diagnosis not present

## 2016-09-19 DIAGNOSIS — R55 Syncope and collapse: Secondary | ICD-10-CM | POA: Diagnosis not present

## 2016-09-19 DIAGNOSIS — M546 Pain in thoracic spine: Secondary | ICD-10-CM | POA: Diagnosis not present

## 2016-09-19 DIAGNOSIS — Y9355 Activity, bike riding: Secondary | ICD-10-CM | POA: Diagnosis not present

## 2016-09-19 DIAGNOSIS — I10 Essential (primary) hypertension: Secondary | ICD-10-CM | POA: Diagnosis not present

## 2016-09-19 DIAGNOSIS — Z8673 Personal history of transient ischemic attack (TIA), and cerebral infarction without residual deficits: Secondary | ICD-10-CM | POA: Insufficient documentation

## 2016-09-19 DIAGNOSIS — T148 Other injury of unspecified body region: Secondary | ICD-10-CM | POA: Diagnosis not present

## 2016-09-19 DIAGNOSIS — M25512 Pain in left shoulder: Secondary | ICD-10-CM | POA: Diagnosis not present

## 2016-09-19 DIAGNOSIS — M25561 Pain in right knee: Secondary | ICD-10-CM | POA: Diagnosis not present

## 2016-09-19 DIAGNOSIS — F17210 Nicotine dependence, cigarettes, uncomplicated: Secondary | ICD-10-CM | POA: Diagnosis not present

## 2016-09-19 DIAGNOSIS — S43402A Unspecified sprain of left shoulder joint, initial encounter: Secondary | ICD-10-CM | POA: Diagnosis not present

## 2016-09-19 DIAGNOSIS — Y999 Unspecified external cause status: Secondary | ICD-10-CM | POA: Diagnosis not present

## 2016-09-19 DIAGNOSIS — Y92480 Sidewalk as the place of occurrence of the external cause: Secondary | ICD-10-CM | POA: Insufficient documentation

## 2016-09-19 DIAGNOSIS — S8391XA Sprain of unspecified site of right knee, initial encounter: Secondary | ICD-10-CM | POA: Insufficient documentation

## 2016-09-19 DIAGNOSIS — Z79899 Other long term (current) drug therapy: Secondary | ICD-10-CM | POA: Insufficient documentation

## 2016-09-19 DIAGNOSIS — M545 Low back pain: Secondary | ICD-10-CM | POA: Diagnosis not present

## 2016-09-19 DIAGNOSIS — S4992XA Unspecified injury of left shoulder and upper arm, initial encounter: Secondary | ICD-10-CM | POA: Diagnosis present

## 2016-09-19 LAB — I-STAT CHEM 8, ED
BUN: 14 mg/dL (ref 6–20)
Calcium, Ion: 1.21 mmol/L (ref 1.15–1.40)
Chloride: 104 mmol/L (ref 101–111)
Creatinine, Ser: 1.1 mg/dL (ref 0.61–1.24)
Glucose, Bld: 77 mg/dL (ref 65–99)
HCT: 39 % (ref 39.0–52.0)
Hemoglobin: 13.3 g/dL (ref 13.0–17.0)
Potassium: 4.1 mmol/L (ref 3.5–5.1)
Sodium: 139 mmol/L (ref 135–145)
TCO2: 25 mmol/L (ref 0–100)

## 2016-09-19 MED ORDER — IBUPROFEN 800 MG PO TABS
800.0000 mg | ORAL_TABLET | Freq: Once | ORAL | Status: AC
  Administered 2016-09-19: 800 mg via ORAL
  Filled 2016-09-19: qty 1
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
## 2016-09-20 DIAGNOSIS — F419 Anxiety disorder, unspecified: Secondary | ICD-10-CM | POA: Diagnosis not present

## 2016-09-20 DIAGNOSIS — M79609 Pain in unspecified limb: Secondary | ICD-10-CM | POA: Diagnosis not present

## 2016-09-20 DIAGNOSIS — F209 Schizophrenia, unspecified: Secondary | ICD-10-CM | POA: Diagnosis not present

## 2016-10-11 ENCOUNTER — Encounter (HOSPITAL_COMMUNITY): Payer: Self-pay | Admitting: Emergency Medicine

## 2016-10-11 ENCOUNTER — Emergency Department (HOSPITAL_COMMUNITY)
Admission: EM | Admit: 2016-10-11 | Discharge: 2016-10-11 | Disposition: A | Discharge status: Home / Self Care | Payer: Medicare Other | Attending: Emergency Medicine | Admitting: Emergency Medicine

## 2016-10-11 ENCOUNTER — Emergency Department (HOSPITAL_COMMUNITY): Payer: Medicare Other

## 2016-10-11 DIAGNOSIS — Y999 Unspecified external cause status: Secondary | ICD-10-CM | POA: Diagnosis not present

## 2016-10-11 DIAGNOSIS — F17210 Nicotine dependence, cigarettes, uncomplicated: Secondary | ICD-10-CM | POA: Diagnosis not present

## 2016-10-11 DIAGNOSIS — M25532 Pain in left wrist: Secondary | ICD-10-CM | POA: Diagnosis not present

## 2016-10-11 DIAGNOSIS — M79642 Pain in left hand: Secondary | ICD-10-CM | POA: Insufficient documentation

## 2016-10-11 DIAGNOSIS — Z8673 Personal history of transient ischemic attack (TIA), and cerebral infarction without residual deficits: Secondary | ICD-10-CM | POA: Insufficient documentation

## 2016-10-11 DIAGNOSIS — S6992XA Unspecified injury of left wrist, hand and finger(s), initial encounter: Secondary | ICD-10-CM | POA: Diagnosis not present

## 2016-10-11 DIAGNOSIS — W1830XA Fall on same level, unspecified, initial encounter: Secondary | ICD-10-CM | POA: Diagnosis not present

## 2016-10-11 DIAGNOSIS — Y939 Activity, unspecified: Secondary | ICD-10-CM | POA: Diagnosis not present

## 2016-10-11 DIAGNOSIS — Y92000 Kitchen of unspecified non-institutional (private) residence as  the place of occurrence of the external cause: Secondary | ICD-10-CM | POA: Diagnosis not present

## 2016-10-11 DIAGNOSIS — Z23 Encounter for immunization: Secondary | ICD-10-CM | POA: Diagnosis not present

## 2016-10-11 DIAGNOSIS — I10 Essential (primary) hypertension: Secondary | ICD-10-CM | POA: Insufficient documentation

## 2016-10-11 DIAGNOSIS — R000 Tachycardia, unspecified: Secondary | ICD-10-CM | POA: Diagnosis not present

## 2016-10-11 DIAGNOSIS — W19XXXA Unspecified fall, initial encounter: Secondary | ICD-10-CM

## 2016-10-11 MED ORDER — HYDROCODONE-ACETAMINOPHEN 5-325 MG PO TABS: 1.0000 | ORAL_TABLET | ORAL | 0 refills | Status: DC | PRN

## 2016-10-11 MED ORDER — HYDROCODONE-ACETAMINOPHEN 5-325 MG PO TABS
1.0000 | ORAL_TABLET | Freq: Once | ORAL | Status: AC
  Administered 2016-10-11: 1 via ORAL
  Filled 2016-10-11: qty 1

## 2016-10-11 MED ORDER — HYDROCODONE-ACETAMINOPHEN 5-325 MG PO TABS: 1.0000 | ORAL_TABLET | Freq: Four times a day (QID) | ORAL | 0 refills | Status: DC | PRN

## 2016-10-11 NOTE — Discharge Instructions (Signed)
Follow-up with your primary care physician in 2-3 days to be reevaluated. Use ice on your hand as often as you can, 20 minutes on, 20 minutes off. Return immediately to the emergency department if you experience worsening hand pain, swelling, numbness/timing, weakness, headache, dizziness, or you pass out, or any other concerning symptoms.
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
## 2016-10-11 NOTE — ED Notes (Signed)
Pt is in stable condition upon d/c and ambulates from ED. 

## 2016-10-11 NOTE — ED Notes (Signed)
Patient transported to X-ray 

## 2016-10-11 NOTE — ED Triage Notes (Signed)
Pt arrived via [facility]. EMS states pt fell and injured left thumb. Been having equilibrium issues for 6 months. Recent fall 6 months ago and fx'd back. Pt hx of bells palsy. [facility] performed 12 lead, reported unremarkable. 148/94, cbg 108, 100-P, R-20, 98% RA.   Pt states he 3-4 hours ago at home in his kitchen. Pt reports he was dizzy at the time he fell but denies any dizziness now.

## 2016-11-03 ENCOUNTER — Other Ambulatory Visit (INDEPENDENT_AMBULATORY_CARE_PROVIDER_SITE_OTHER): Payer: Self-pay | Admitting: Orthopedic Surgery

## 2016-11-21 DIAGNOSIS — R531 Weakness: Secondary | ICD-10-CM | POA: Diagnosis not present

## 2016-11-22 DIAGNOSIS — F250 Schizoaffective disorder, bipolar type: Secondary | ICD-10-CM | POA: Diagnosis not present

## 2016-12-12 ENCOUNTER — Encounter (INDEPENDENT_AMBULATORY_CARE_PROVIDER_SITE_OTHER): Payer: Self-pay | Admitting: Orthopedic Surgery

## 2016-12-12 ENCOUNTER — Ambulatory Visit (INDEPENDENT_AMBULATORY_CARE_PROVIDER_SITE_OTHER): Payer: Medicare Other | Admitting: Orthopedic Surgery

## 2016-12-12 VITALS — Ht 73.0 in | Wt 200.0 lb

## 2016-12-12 DIAGNOSIS — M170 Bilateral primary osteoarthritis of knee: Secondary | ICD-10-CM | POA: Diagnosis not present

## 2016-12-12 MED ORDER — HYDROCODONE-ACETAMINOPHEN 5-325 MG PO TABS: 1.0000 | ORAL_TABLET | ORAL | 0 refills | Status: DC | PRN

## 2016-12-12 MED ORDER — LIDOCAINE HCL 1 % IJ SOLN
5.0000 mL | INTRAMUSCULAR | Status: AC | PRN
  Administered 2016-12-12: 5 mL

## 2016-12-12 MED ORDER — DICLOFENAC SODIUM 1 % TD GEL: 2.0000 g | Freq: Four times a day (QID) | TRANSDERMAL | 6 refills | Status: AC | PRN

## 2016-12-12 MED ORDER — METHYLPREDNISOLONE ACETATE 40 MG/ML IJ SUSP
40.0000 mg | INTRAMUSCULAR | Status: AC | PRN
  Administered 2016-12-12: 40 mg via INTRA_ARTICULAR

## 2016-12-12 NOTE — Addendum Note (Signed)
Addended by: Pamella Pert on: 12/12/2016 02:14 PM   Modules accepted: Orders
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
## 2016-12-19 ENCOUNTER — Emergency Department (HOSPITAL_COMMUNITY)
Admission: EM | Admit: 2016-12-19 | Discharge: 2016-12-19 | Discharge status: Against Medical Advice | Payer: Medicare Other

## 2016-12-19 NOTE — ED Notes (Signed)
Called x 2 for triage. 

## 2016-12-19 NOTE — ED Notes (Signed)
Pt called x 2 for triage.

## 2016-12-20 ENCOUNTER — Encounter (HOSPITAL_COMMUNITY): Payer: Self-pay

## 2016-12-20 ENCOUNTER — Emergency Department (HOSPITAL_COMMUNITY)
Admission: EM | Admit: 2016-12-20 | Discharge: 2016-12-20 | Disposition: A | Discharge status: Home / Self Care | Payer: Medicare Other | Attending: Emergency Medicine | Admitting: Emergency Medicine

## 2016-12-20 DIAGNOSIS — M2550 Pain in unspecified joint: Secondary | ICD-10-CM | POA: Insufficient documentation

## 2016-12-20 DIAGNOSIS — Z8673 Personal history of transient ischemic attack (TIA), and cerebral infarction without residual deficits: Secondary | ICD-10-CM | POA: Insufficient documentation

## 2016-12-20 DIAGNOSIS — F17210 Nicotine dependence, cigarettes, uncomplicated: Secondary | ICD-10-CM | POA: Insufficient documentation

## 2016-12-20 DIAGNOSIS — Z79899 Other long term (current) drug therapy: Secondary | ICD-10-CM | POA: Insufficient documentation

## 2016-12-20 DIAGNOSIS — I10 Essential (primary) hypertension: Secondary | ICD-10-CM | POA: Diagnosis not present

## 2016-12-20 LAB — RAPID URINE DRUG SCREEN, HOSP PERFORMED
Amphetamines: NOT DETECTED
Barbiturates: NOT DETECTED
Benzodiazepines: NOT DETECTED
Cocaine: NOT DETECTED
Opiates: NOT DETECTED
Tetrahydrocannabinol: NOT DETECTED

## 2016-12-20 MED ORDER — IBUPROFEN 400 MG PO TABS
400.0000 mg | ORAL_TABLET | Freq: Once | ORAL | Status: AC | PRN
  Administered 2016-12-20: 400 mg via ORAL
  Filled 2016-12-20: qty 1

## 2016-12-20 MED ORDER — ACETAMINOPHEN 325 MG PO TABS
650.0000 mg | ORAL_TABLET | Freq: Once | ORAL | Status: AC
  Administered 2016-12-20: 650 mg via ORAL
  Filled 2016-12-20: qty 2

## 2016-12-20 NOTE — ED Notes (Signed)
Pt came up to tech first and asked how much longer it would be before he would go back to a room. Tech first informed pt that he had been placed into a room and that we were waiting on the room to be prepared for him and that someone would come get him shortly. Pt was called for room placement 4x with no answer.

## 2016-12-20 NOTE — ED Notes (Signed)
Pt given a meal. Reviewed discharge instructions. Pt refused wheelchair and is ambulatory on discharge.
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
## 2016-12-20 NOTE — ED Notes (Signed)
Pt located. Pt eating a ham and cheese sandwich and drinking a coke. Vitals obtained.

## 2016-12-20 NOTE — ED Triage Notes (Addendum)
Per Pt, Pt reports it is painful to swallow and he has sores noted to his mouth that started "right after Thanksgiving" along with congestion for three days. Pt also complains of frequent falls due to unleveled right and left leg. Reports generalized joint pain due to gout. He has been seeing MD Sharol Given and given topical gel with no relief. Pt reports MD pulling fluid off of knees one week ago. Denies new swelling.

## 2016-12-20 NOTE — Discharge Instructions (Signed)
There are no specific treatments for arthralgia.  Some things that can help include heat, and taking Tylenol.  It is important to follow-up with a primary care doctor. Use the phone number attached, to help you find a primary care physician.

## 2017-01-03 ENCOUNTER — Telehealth (INDEPENDENT_AMBULATORY_CARE_PROVIDER_SITE_OTHER): Payer: Self-pay | Admitting: *Deleted

## 2017-01-03 NOTE — Telephone Encounter (Signed)
Pt stated he hasn't received call from pain management yet

## 2017-01-08 DIAGNOSIS — F209 Schizophrenia, unspecified: Secondary | ICD-10-CM | POA: Diagnosis not present

## 2017-01-08 DIAGNOSIS — M79609 Pain in unspecified limb: Secondary | ICD-10-CM | POA: Diagnosis not present

## 2017-01-08 DIAGNOSIS — F419 Anxiety disorder, unspecified: Secondary | ICD-10-CM | POA: Diagnosis not present

## 2017-01-09 NOTE — Telephone Encounter (Signed)
I called pt and advised him that his referral is in review and should be hearing from someone to set up appt

## 2017-01-09 NOTE — Telephone Encounter (Signed)
Pt is wanting to check the status of his pain management referral.

## 2017-01-20 ENCOUNTER — Emergency Department (HOSPITAL_COMMUNITY)
Admission: EM | Admit: 2017-01-20 | Discharge: 2017-01-20 | Disposition: A | Discharge status: Home / Self Care | Payer: Medicare Other | Attending: Emergency Medicine | Admitting: Emergency Medicine

## 2017-01-20 ENCOUNTER — Encounter (HOSPITAL_COMMUNITY): Payer: Self-pay | Admitting: Emergency Medicine

## 2017-01-20 DIAGNOSIS — Z79899 Other long term (current) drug therapy: Secondary | ICD-10-CM | POA: Diagnosis not present

## 2017-01-20 DIAGNOSIS — I10 Essential (primary) hypertension: Secondary | ICD-10-CM | POA: Insufficient documentation

## 2017-01-20 DIAGNOSIS — G8929 Other chronic pain: Secondary | ICD-10-CM

## 2017-01-20 DIAGNOSIS — F17210 Nicotine dependence, cigarettes, uncomplicated: Secondary | ICD-10-CM | POA: Diagnosis not present

## 2017-01-20 DIAGNOSIS — R0981 Nasal congestion: Secondary | ICD-10-CM | POA: Diagnosis present

## 2017-01-20 DIAGNOSIS — M109 Gout, unspecified: Secondary | ICD-10-CM | POA: Diagnosis not present

## 2017-01-20 DIAGNOSIS — J069 Acute upper respiratory infection, unspecified: Secondary | ICD-10-CM | POA: Insufficient documentation

## 2017-01-20 DIAGNOSIS — M545 Low back pain: Secondary | ICD-10-CM | POA: Diagnosis not present

## 2017-01-20 DIAGNOSIS — M1990 Unspecified osteoarthritis, unspecified site: Secondary | ICD-10-CM | POA: Diagnosis not present

## 2017-01-20 MED ORDER — ACETAMINOPHEN 500 MG PO TABS
1000.0000 mg | ORAL_TABLET | Freq: Once | ORAL | Status: AC
  Administered 2017-01-20: 1000 mg via ORAL

## 2017-01-20 MED ORDER — CETIRIZINE HCL 10 MG PO TABS: 10.0000 mg | ORAL_TABLET | Freq: Every day | ORAL | 1 refills | Status: DC

## 2017-01-20 NOTE — ED Notes (Signed)
Pt requesting pain medication, EDP to be notified. 

## 2017-01-20 NOTE — ED Triage Notes (Signed)
Per pt, gout has flared up this past 4 days, "it won't go away".  He also c/o congestion for the past two days
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
## 2017-01-29 ENCOUNTER — Telehealth (INDEPENDENT_AMBULATORY_CARE_PROVIDER_SITE_OTHER): Payer: Self-pay | Admitting: *Deleted

## 2017-01-29 NOTE — Telephone Encounter (Signed)
Patient called in this morning regarding having some questions about a referral to the pain clinic that Dr. Sharol Given was working on. He would like to know if the referral has been sent? because he has not heard from anyone regarding this. His CB # [phone]. Thank you

## 2017-02-01 NOTE — Telephone Encounter (Signed)
Can you please call and check the status of the referral for this pt to pain management and call the pt with update?

## 2017-02-02 NOTE — Telephone Encounter (Signed)
I spoke with pt and advised him that Dr. Andree Elk at [facility] pain mgmt had declined his referral but is waiting for new PA to come in a review, and once reviewed and approved will call pt to schedule.

## 2017-02-07 ENCOUNTER — Ambulatory Visit (INDEPENDENT_AMBULATORY_CARE_PROVIDER_SITE_OTHER): Payer: Medicare Other | Admitting: Orthopedic Surgery

## 2017-02-09 ENCOUNTER — Encounter (HOSPITAL_COMMUNITY): Payer: Self-pay

## 2017-02-09 ENCOUNTER — Ambulatory Visit (INDEPENDENT_AMBULATORY_CARE_PROVIDER_SITE_OTHER): Payer: Medicare Other | Admitting: Family

## 2017-02-09 ENCOUNTER — Encounter (INDEPENDENT_AMBULATORY_CARE_PROVIDER_SITE_OTHER): Payer: Self-pay | Admitting: Orthopedic Surgery

## 2017-02-09 ENCOUNTER — Emergency Department (HOSPITAL_COMMUNITY)
Admission: EM | Admit: 2017-02-09 | Discharge: 2017-02-10 | Disposition: A | Discharge status: Home / Self Care | Payer: Medicare Other | Attending: Emergency Medicine | Admitting: Emergency Medicine

## 2017-02-09 DIAGNOSIS — F17210 Nicotine dependence, cigarettes, uncomplicated: Secondary | ICD-10-CM | POA: Diagnosis not present

## 2017-02-09 DIAGNOSIS — I10 Essential (primary) hypertension: Secondary | ICD-10-CM | POA: Insufficient documentation

## 2017-02-09 DIAGNOSIS — G20 Parkinson's disease: Secondary | ICD-10-CM | POA: Insufficient documentation

## 2017-02-09 DIAGNOSIS — K2970 Gastritis, unspecified, without bleeding: Secondary | ICD-10-CM | POA: Diagnosis not present

## 2017-02-09 DIAGNOSIS — G510 Bell's palsy: Secondary | ICD-10-CM | POA: Diagnosis not present

## 2017-02-09 DIAGNOSIS — M1712 Unilateral primary osteoarthritis, left knee: Secondary | ICD-10-CM | POA: Diagnosis not present

## 2017-02-09 DIAGNOSIS — G20A1 Parkinson's disease without dyskinesia, without mention of fluctuations: Secondary | ICD-10-CM

## 2017-02-09 DIAGNOSIS — M25462 Effusion, left knee: Secondary | ICD-10-CM | POA: Diagnosis not present

## 2017-02-09 DIAGNOSIS — M25461 Effusion, right knee: Secondary | ICD-10-CM | POA: Diagnosis not present

## 2017-02-09 DIAGNOSIS — R112 Nausea with vomiting, unspecified: Secondary | ICD-10-CM | POA: Diagnosis not present

## 2017-02-09 DIAGNOSIS — Z79899 Other long term (current) drug therapy: Secondary | ICD-10-CM | POA: Diagnosis not present

## 2017-02-09 DIAGNOSIS — Z8673 Personal history of transient ischemic attack (TIA), and cerebral infarction without residual deficits: Secondary | ICD-10-CM | POA: Insufficient documentation

## 2017-02-09 DIAGNOSIS — M1711 Unilateral primary osteoarthritis, right knee: Secondary | ICD-10-CM | POA: Diagnosis not present

## 2017-02-09 LAB — CBC
HCT: 40 % (ref 39.0–52.0)
Hemoglobin: 13.6 g/dL (ref 13.0–17.0)
MCH: 28 pg (ref 26.0–34.0)
MCHC: 34 g/dL (ref 30.0–36.0)
MCV: 82.3 fL (ref 78.0–100.0)
Platelets: 327 10*3/uL (ref 150–400)
RBC: 4.86 MIL/uL (ref 4.22–5.81)
RDW: 13.7 % (ref 11.5–15.5)
WBC: 7.5 10*3/uL (ref 4.0–10.5)

## 2017-02-09 LAB — COMPREHENSIVE METABOLIC PANEL
ALT: 31 U/L (ref 17–63)
AST: 35 U/L (ref 15–41)
Albumin: 4.1 g/dL (ref 3.5–5.0)
Alkaline Phosphatase: 81 U/L (ref 38–126)
Anion gap: 9 (ref 5–15)
BUN: 16 mg/dL (ref 6–20)
CO2: 23 mmol/L (ref 22–32)
Calcium: 9.2 mg/dL (ref 8.9–10.3)
Chloride: 103 mmol/L (ref 101–111)
Creatinine, Ser: 1.02 mg/dL (ref 0.61–1.24)
GFR calc Af Amer: >60 mL/min (ref >60)
GFR calc non Af Amer: >60 mL/min (ref >60)
Glucose, Bld: 229 mg/dL — ABNORMAL HIGH (ref 65–99)
Potassium: 4.8 mmol/L (ref 3.5–5.1)
Sodium: 135 mmol/L (ref 135–145)
Total Bilirubin: 0.6 mg/dL (ref 0.3–1.2)
Total Protein: 7.3 g/dL (ref 6.5–8.1)

## 2017-02-09 LAB — URINALYSIS, ROUTINE W REFLEX MICROSCOPIC
Bilirubin Urine: NEGATIVE
Glucose, UA: 50 mg/dL — AB
Hgb urine dipstick: NEGATIVE
Ketones, ur: 5 mg/dL — AB
Leukocytes, UA: NEGATIVE
Nitrite: NEGATIVE
Protein, ur: NEGATIVE mg/dL
Specific Gravity, Urine: 1.014 (ref 1.005–1.030)
pH: 5 (ref 5.0–8.0)

## 2017-02-09 LAB — LIPASE, BLOOD: Lipase: 25 U/L (ref 11–51)

## 2017-02-09 MED ORDER — SODIUM CHLORIDE 0.9 % IV BOLUS (SEPSIS)
1000.0000 mL | Freq: Once | INTRAVENOUS | Status: AC
  Administered 2017-02-09: 1000 mL via INTRAVENOUS

## 2017-02-09 MED ORDER — METHYLPREDNISOLONE ACETATE 40 MG/ML IJ SUSP
40.0000 mg | INTRAMUSCULAR | Status: AC | PRN
  Administered 2017-02-09: 40 mg via INTRA_ARTICULAR

## 2017-02-09 MED ORDER — ONDANSETRON HCL 4 MG/2ML IJ SOLN
4.0000 mg | Freq: Once | INTRAMUSCULAR | Status: AC
  Administered 2017-02-09: 4 mg via INTRAVENOUS
  Filled 2017-02-09: qty 2

## 2017-02-09 MED ORDER — LIDOCAINE HCL 1 % IJ SOLN
5.0000 mL | INTRAMUSCULAR | Status: AC | PRN
  Administered 2017-02-09: 5 mL

## 2017-02-09 MED ORDER — LOPERAMIDE HCL 2 MG PO CAPS
4.0000 mg | ORAL_CAPSULE | Freq: Once | ORAL | Status: AC
  Administered 2017-02-09: 4 mg via ORAL
  Filled 2017-02-09: qty 2

## 2017-02-09 MED ORDER — IBUPROFEN 800 MG PO TABS
800.0000 mg | ORAL_TABLET | Freq: Once | ORAL | Status: AC
  Administered 2017-02-09: 800 mg via ORAL
  Filled 2017-02-09: qty 1

## 2017-02-09 MED ORDER — IBUPROFEN 800 MG PO TABS: 800.0000 mg | ORAL_TABLET | Freq: Three times a day (TID) | ORAL | 0 refills | Status: DC | PRN
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
## 2017-02-09 NOTE — ED Notes (Signed)
Pt went to [place] [facility].

## 2017-02-09 NOTE — ED Notes (Signed)
Called pt, but no response from waiting room.

## 2017-02-09 NOTE — ED Triage Notes (Addendum)
Pt BIB [facility] from home c/o nausea and vomiting for the last 3-4 days. HE also endorses diarrhea.Pt states that he was previously seen for the same thing, but doesn't remember when. Hx Bell's Palsy. He states that he is also experiencing LLQ abd pain.  A&Ox4. Ambulatory.

## 2017-02-10 DIAGNOSIS — R112 Nausea with vomiting, unspecified: Secondary | ICD-10-CM | POA: Diagnosis not present

## 2017-02-10 MED ORDER — ONDANSETRON HCL 4 MG PO TABS: 4.0000 mg | ORAL_TABLET | Freq: Four times a day (QID) | ORAL | 0 refills | Status: DC | PRN

## 2017-02-10 MED ORDER — POLYVINYL ALCOHOL 1.4 % OP SOLN: 1.0000 [drp] | OPHTHALMIC | 2 refills | Status: DC | PRN

## 2017-02-10 MED ORDER — BENZTROPINE MESYLATE 1 MG PO TABS: 1.0000 mg | ORAL_TABLET | Freq: Two times a day (BID) | ORAL | 0 refills | Status: DC

## 2017-02-10 MED ORDER — AMANTADINE HCL 100 MG PO CAPS: 100.0000 mg | ORAL_CAPSULE | Freq: Two times a day (BID) | ORAL | 0 refills | Status: DC

## 2017-02-10 NOTE — Discharge Instructions (Signed)
Take loperamide (Imodium A-D) as needed for diarrhea. ?

## 2017-02-13 ENCOUNTER — Telehealth (INDEPENDENT_AMBULATORY_CARE_PROVIDER_SITE_OTHER): Payer: Self-pay | Admitting: *Deleted

## 2017-02-13 NOTE — Telephone Encounter (Signed)
-----   Message from Suzan Slick, NP sent at 02/09/2017 11:28 AM EST ----- Would you mind sending a pain management referral to an office in [place] since the [place] clinic declined him

## 2017-02-13 NOTE — Telephone Encounter (Signed)
Noted, referral placed.  

## 2017-02-22 DIAGNOSIS — Z79891 Long term (current) use of opiate analgesic: Secondary | ICD-10-CM | POA: Diagnosis not present

## 2017-03-08 ENCOUNTER — Emergency Department (HOSPITAL_COMMUNITY)
Admission: EM | Admit: 2017-03-08 | Discharge: 2017-03-08 | Disposition: A | Discharge status: Home / Self Care | Payer: Medicare Other | Attending: Emergency Medicine | Admitting: Emergency Medicine

## 2017-03-08 ENCOUNTER — Encounter (HOSPITAL_COMMUNITY): Payer: Self-pay | Admitting: Emergency Medicine

## 2017-03-08 DIAGNOSIS — Z79899 Other long term (current) drug therapy: Secondary | ICD-10-CM | POA: Insufficient documentation

## 2017-03-08 DIAGNOSIS — R58 Hemorrhage, not elsewhere classified: Secondary | ICD-10-CM | POA: Diagnosis not present

## 2017-03-08 DIAGNOSIS — Z8673 Personal history of transient ischemic attack (TIA), and cerebral infarction without residual deficits: Secondary | ICD-10-CM | POA: Insufficient documentation

## 2017-03-08 DIAGNOSIS — I10 Essential (primary) hypertension: Secondary | ICD-10-CM | POA: Insufficient documentation

## 2017-03-08 DIAGNOSIS — F17210 Nicotine dependence, cigarettes, uncomplicated: Secondary | ICD-10-CM | POA: Insufficient documentation

## 2017-03-08 DIAGNOSIS — K921 Melena: Secondary | ICD-10-CM | POA: Diagnosis not present

## 2017-03-08 DIAGNOSIS — K625 Hemorrhage of anus and rectum: Secondary | ICD-10-CM | POA: Insufficient documentation

## 2017-03-08 LAB — CBC
HCT: 38.4 % — ABNORMAL LOW (ref 39.0–52.0)
Hemoglobin: 12.7 g/dL — ABNORMAL LOW (ref 13.0–17.0)
MCH: 27.4 pg (ref 26.0–34.0)
MCHC: 33.1 g/dL (ref 30.0–36.0)
MCV: 82.8 fL (ref 78.0–100.0)
Platelets: 334 10*3/uL (ref 150–400)
RBC: 4.64 MIL/uL (ref 4.22–5.81)
RDW: 14.1 % (ref 11.5–15.5)
WBC: 7.6 10*3/uL (ref 4.0–10.5)

## 2017-03-08 LAB — COMPREHENSIVE METABOLIC PANEL
ALT: 20 U/L (ref 17–63)
AST: 17 U/L (ref 15–41)
Albumin: 3.7 g/dL (ref 3.5–5.0)
Alkaline Phosphatase: 68 U/L (ref 38–126)
Anion gap: 8 (ref 5–15)
BUN: 17 mg/dL (ref 6–20)
CO2: 22 mmol/L (ref 22–32)
Calcium: 8.7 mg/dL — ABNORMAL LOW (ref 8.9–10.3)
Chloride: 108 mmol/L (ref 101–111)
Creatinine, Ser: 0.97 mg/dL (ref 0.61–1.24)
GFR calc Af Amer: >60 mL/min (ref >60)
GFR calc non Af Amer: >60 mL/min (ref >60)
Glucose, Bld: 100 mg/dL — ABNORMAL HIGH (ref 65–99)
Potassium: 3.2 mmol/L — ABNORMAL LOW (ref 3.5–5.1)
Sodium: 138 mmol/L (ref 135–145)
Total Bilirubin: 0.3 mg/dL (ref 0.3–1.2)
Total Protein: 7.1 g/dL (ref 6.5–8.1)

## 2017-03-08 LAB — TYPE AND SCREEN
ABO/RH(D): O NEG
Antibody Screen: NEGATIVE

## 2017-03-08 LAB — ABO/RH: ABO/RH(D): O NEG

## 2017-03-08 LAB — POC OCCULT BLOOD, ED: Fecal Occult Bld: POSITIVE — AB

## 2017-03-08 MED ORDER — ACETAMINOPHEN 325 MG PO TABS
650.0000 mg | ORAL_TABLET | Freq: Once | ORAL | Status: AC
  Administered 2017-03-08: 650 mg via ORAL
  Filled 2017-03-08: qty 2

## 2017-03-08 NOTE — ED Notes (Signed)
Patient called x1 to be taken to treatment room. No answer.
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
## 2017-03-08 NOTE — ED Triage Notes (Addendum)
Per EMS, patient from home, c/o "blood clots on toilet paper after wiping" x2 days. Denies abdominal pain, N/V/D. Ambulatory with EMS.

## 2017-03-08 NOTE — Discharge Instructions (Signed)
Please follow up with gastroenterology, [facility], for a colonoscopy. Call tomorrow to schedule appointment. Drink at least 8 glasses of water and maintain a high fiber diet.   Get help right away if: Your bleeding increases. You feel light-headed or you faint. You feel weak. You have severe cramps in your back or abdomen. You pass large blood clots in your stool. Your symptoms are getting worse.

## 2017-03-13 ENCOUNTER — Encounter: Payer: Self-pay | Admitting: Nurse Practitioner

## 2017-03-13 ENCOUNTER — Ambulatory Visit (INDEPENDENT_AMBULATORY_CARE_PROVIDER_SITE_OTHER): Payer: Medicare Other | Admitting: Nurse Practitioner

## 2017-03-13 VITALS — BP 134/90 | HR 104 | Ht 73.0 in | Wt 216.0 lb

## 2017-03-13 DIAGNOSIS — K625 Hemorrhage of anus and rectum: Secondary | ICD-10-CM | POA: Diagnosis not present

## 2017-03-13 MED ORDER — NA SULFATE-K SULFATE-MG SULF 17.5-3.13-1.6 GM/177ML PO SOLN: 1.0000 | Freq: Once | ORAL | 0 refills | Status: AC

## 2017-03-13 NOTE — Patient Instructions (Signed)

## 2017-03-13 NOTE — Progress Notes (Signed)
Thank you for sending this case to me. I have reviewed the entire note, and the outlined plan seems appropriate.   Oluwadamilola Rosamond Danis, MD  
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
## 2017-03-26 ENCOUNTER — Encounter: Payer: Self-pay | Admitting: Pain Medicine

## 2017-03-26 DIAGNOSIS — G894 Chronic pain syndrome: Secondary | ICD-10-CM | POA: Insufficient documentation

## 2017-03-26 DIAGNOSIS — Z79891 Long term (current) use of opiate analgesic: Secondary | ICD-10-CM | POA: Diagnosis not present

## 2017-03-26 DIAGNOSIS — F1190 Opioid use, unspecified, uncomplicated: Secondary | ICD-10-CM | POA: Insufficient documentation

## 2017-03-26 NOTE — Progress Notes (Signed)
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 complete loss of appetite and sleep deprivation.  Blood pressure and heart  rate becomes significantly elevated. Caution: If elevated blood pressure triggers a pounding headache associated with blurred vision, then the patient should immediately seek attention at an urgent or emergency care unit, as these may be signs of an impending stroke.    Emergency Department Pain Levels (6-10/10)  Emergency Room Pain 6 Severely limiting. Requires emergency care and should not be seen or managed at an outpatient pain management facility. Communication becomes difficult and requires great effort. Assistance to reach the emergency department may be required. Facial flushing and profuse sweating along with potentially dangerous increases in heart rate and blood pressure will be evident.   Distressing pain 7 Self-care is very difficult. Assistance is required to transport, or use restroom. Assistance to reach the emergency department will be required. Tasks requiring coordination, such as bathing and getting dressed become very difficult.   Disabling pain 8 Self-care is no longer possible. At this level, pain is disabling. The individual is unable to do even the most "basic" activities such as walking, eating, bathing, dressing, transferring to a bed, or toileting. Fine motor skills are lost. It is difficult to think clearly.   Incapacitating pain 9 Pain becomes incapacitating. Thought processing is no longer possible. Difficult to remember your own name. Control of movement and coordination are lost.   The worst pain imaginable 10 At this level, most patients pass out from pain. When this level is reached, collapse of the autonomic nervous system occurs, leading to a sudden drop in blood pressure and heart rate. This in turn results in a temporary and dramatic drop in blood flow to the brain, leading to a loss  of consciousness. Fainting is one of the body's self defense mechanisms. Passing out puts the brain in a calmed state and causes it to shut down for a while, in order to begin the healing process.    Summary: 1. Refer to this scale when providing [place] with your pain level. 2. Be accurate and careful when reporting your pain level. This will help with your care. 3. Over-reporting your pain level will lead to loss of credibility. 4. Even a level of 1/10 means that there is pain and will be treated at our facility. 5. High, inaccurate reporting will be documented as "Symptom Exaggeration", leading to loss of credibility and suspicions of possible secondary gains such as obtaining more narcotics, or wanting to appear disabled, for fraudulent reasons. 6. Only pain levels of 5 or below will be seen at our facility. 7. Pain levels of 6 and above will be sent to the Emergency Department and the appointment cancelled. _____________________________________________________________________________________________

## 2017-03-27 ENCOUNTER — Ambulatory Visit: Payer: Medicare Other | Attending: Pain Medicine | Admitting: Pain Medicine

## 2017-03-27 ENCOUNTER — Encounter: Payer: Self-pay | Admitting: Pain Medicine

## 2017-03-27 VITALS — BP 143/100 | HR 102 | Temp 98.0°F | Resp 16 | Ht 73.0 in | Wt 215.0 lb

## 2017-03-27 DIAGNOSIS — F259 Schizoaffective disorder, unspecified: Secondary | ICD-10-CM | POA: Diagnosis not present

## 2017-03-27 DIAGNOSIS — F1110 Opioid abuse, uncomplicated: Secondary | ICD-10-CM

## 2017-03-27 DIAGNOSIS — M5136 Other intervertebral disc degeneration, lumbar region: Secondary | ICD-10-CM | POA: Diagnosis not present

## 2017-03-27 DIAGNOSIS — F17210 Nicotine dependence, cigarettes, uncomplicated: Secondary | ICD-10-CM | POA: Diagnosis not present

## 2017-03-27 DIAGNOSIS — M19011 Primary osteoarthritis, right shoulder: Secondary | ICD-10-CM | POA: Diagnosis not present

## 2017-03-27 DIAGNOSIS — M25572 Pain in left ankle and joints of left foot: Secondary | ICD-10-CM | POA: Diagnosis not present

## 2017-03-27 DIAGNOSIS — R209 Unspecified disturbances of skin sensation: Secondary | ICD-10-CM

## 2017-03-27 DIAGNOSIS — M546 Pain in thoracic spine: Secondary | ICD-10-CM

## 2017-03-27 DIAGNOSIS — M47897 Other spondylosis, lumbosacral region: Secondary | ICD-10-CM | POA: Diagnosis not present

## 2017-03-27 DIAGNOSIS — M4722 Other spondylosis with radiculopathy, cervical region: Secondary | ICD-10-CM | POA: Diagnosis not present

## 2017-03-27 DIAGNOSIS — M9981 Other biomechanical lesions of cervical region: Secondary | ICD-10-CM

## 2017-03-27 DIAGNOSIS — M25562 Pain in left knee: Secondary | ICD-10-CM

## 2017-03-27 DIAGNOSIS — G894 Chronic pain syndrome: Secondary | ICD-10-CM

## 2017-03-27 DIAGNOSIS — F14980 Cocaine use, unspecified with cocaine-induced anxiety disorder: Secondary | ICD-10-CM

## 2017-03-27 DIAGNOSIS — M47816 Spondylosis without myelopathy or radiculopathy, lumbar region: Secondary | ICD-10-CM

## 2017-03-27 DIAGNOSIS — M4802 Spinal stenosis, cervical region: Secondary | ICD-10-CM | POA: Diagnosis not present

## 2017-03-27 DIAGNOSIS — M545 Low back pain, unspecified: Secondary | ICD-10-CM

## 2017-03-27 DIAGNOSIS — M51369 Other intervertebral disc degeneration, lumbar region without mention of lumbar back pain or lower extremity pain: Secondary | ICD-10-CM | POA: Insufficient documentation

## 2017-03-27 DIAGNOSIS — M79674 Pain in right toe(s): Secondary | ICD-10-CM | POA: Diagnosis not present

## 2017-03-27 DIAGNOSIS — M47896 Other spondylosis, lumbar region: Secondary | ICD-10-CM | POA: Insufficient documentation

## 2017-03-27 DIAGNOSIS — M79642 Pain in left hand: Secondary | ICD-10-CM

## 2017-03-27 DIAGNOSIS — M19012 Primary osteoarthritis, left shoulder: Secondary | ICD-10-CM

## 2017-03-27 DIAGNOSIS — Z79891 Long term (current) use of opiate analgesic: Secondary | ICD-10-CM | POA: Diagnosis not present

## 2017-03-27 DIAGNOSIS — F1410 Cocaine abuse, uncomplicated: Secondary | ICD-10-CM | POA: Insufficient documentation

## 2017-03-27 DIAGNOSIS — M4856XS Collapsed vertebra, not elsewhere classified, lumbar region, sequela of fracture: Secondary | ICD-10-CM | POA: Insufficient documentation

## 2017-03-27 DIAGNOSIS — M542 Cervicalgia: Secondary | ICD-10-CM | POA: Diagnosis not present

## 2017-03-27 DIAGNOSIS — S32010S Wedge compression fracture of first lumbar vertebra, sequela: Secondary | ICD-10-CM

## 2017-03-27 DIAGNOSIS — G8929 Other chronic pain: Secondary | ICD-10-CM

## 2017-03-27 DIAGNOSIS — Z79899 Other long term (current) drug therapy: Secondary | ICD-10-CM | POA: Insufficient documentation

## 2017-03-27 DIAGNOSIS — M25571 Pain in right ankle and joints of right foot: Secondary | ICD-10-CM | POA: Diagnosis not present

## 2017-03-27 DIAGNOSIS — M5137 Other intervertebral disc degeneration, lumbosacral region: Secondary | ICD-10-CM | POA: Insufficient documentation

## 2017-03-27 DIAGNOSIS — F1190 Opioid use, unspecified, uncomplicated: Secondary | ICD-10-CM

## 2017-03-27 DIAGNOSIS — M25511 Pain in right shoulder: Secondary | ICD-10-CM | POA: Diagnosis not present

## 2017-03-27 DIAGNOSIS — M170 Bilateral primary osteoarthritis of knee: Secondary | ICD-10-CM | POA: Diagnosis not present

## 2017-03-27 DIAGNOSIS — M25512 Pain in left shoulder: Secondary | ICD-10-CM

## 2017-03-27 DIAGNOSIS — M25561 Pain in right knee: Secondary | ICD-10-CM

## 2017-03-27 DIAGNOSIS — M5412 Radiculopathy, cervical region: Secondary | ICD-10-CM

## 2017-03-27 DIAGNOSIS — M79675 Pain in left toe(s): Secondary | ICD-10-CM

## 2017-03-27 NOTE — Patient Instructions (Signed)

## 2017-04-25 ENCOUNTER — Telehealth: Payer: Self-pay | Admitting: Gastroenterology

## 2017-04-26 ENCOUNTER — Encounter: Payer: Medicare Other | Admitting: Gastroenterology

## 2017-04-26 NOTE — Telephone Encounter (Signed)
Naturally, I will not charge a cancel fee to this patient. Please give him a Suprep sample.

## 2017-04-26 NOTE — Telephone Encounter (Signed)
Left a message to come and pick up a free suprep.

## 2017-05-07 ENCOUNTER — Telehealth: Payer: Self-pay | Admitting: Gastroenterology

## 2017-05-07 ENCOUNTER — Encounter: Payer: Medicare Other | Admitting: Gastroenterology

## 2017-05-07 NOTE — Telephone Encounter (Signed)
Please see Dr. Loletha Carrow' note. I am not sure how to do this, but I knew who to route it to. Thank you.

## 2017-05-07 NOTE — Telephone Encounter (Signed)
That is two short-notice or same day cancellations.  Please charge procedure no-show fee and he is not to be rescheduled for another procedure.

## 2017-05-14 ENCOUNTER — Emergency Department (HOSPITAL_COMMUNITY)
Admission: EM | Admit: 2017-05-14 | Discharge: 2017-05-14 | Disposition: A | Discharge status: Home / Self Care | Payer: Medicare Other | Attending: Dermatology | Admitting: Dermatology

## 2017-05-14 DIAGNOSIS — R253 Fasciculation: Secondary | ICD-10-CM | POA: Diagnosis not present

## 2017-05-14 DIAGNOSIS — Z5321 Procedure and treatment not carried out due to patient leaving prior to being seen by health care provider: Secondary | ICD-10-CM | POA: Diagnosis not present

## 2017-05-14 DIAGNOSIS — I6789 Other cerebrovascular disease: Secondary | ICD-10-CM | POA: Diagnosis not present

## 2017-05-14 DIAGNOSIS — R251 Tremor, unspecified: Secondary | ICD-10-CM | POA: Diagnosis not present

## 2017-05-14 NOTE — ED Notes (Signed)
Called for recheck of vital signs, no answer

## 2017-05-14 NOTE — ED Notes (Signed)
Patient called for room again with no response ?

## 2017-05-14 NOTE — ED Triage Notes (Signed)
Per EMS pt complaint of left facial twitching related to hx of Bell's palsy; out of medications; requesting prescriptions.

## 2017-05-14 NOTE — ED Notes (Signed)
Patient called from lobby to room with no response.

## 2017-05-14 NOTE — Telephone Encounter (Signed)
Due to [employer], we are unable to bill patient the cancel fee.

## 2017-05-29 DIAGNOSIS — F250 Schizoaffective disorder, bipolar type: Secondary | ICD-10-CM | POA: Diagnosis not present

## 2017-06-07 ENCOUNTER — Ambulatory Visit (INDEPENDENT_AMBULATORY_CARE_PROVIDER_SITE_OTHER): Payer: Medicare Other | Admitting: Orthopedic Surgery

## 2017-06-07 ENCOUNTER — Encounter (INDEPENDENT_AMBULATORY_CARE_PROVIDER_SITE_OTHER): Payer: Self-pay | Admitting: Orthopedic Surgery

## 2017-06-07 VITALS — Ht 73.0 in | Wt 215.0 lb

## 2017-06-07 DIAGNOSIS — M170 Bilateral primary osteoarthritis of knee: Secondary | ICD-10-CM

## 2017-06-07 MED ORDER — LIDOCAINE HCL 1 % IJ SOLN
5.0000 mL | INTRAMUSCULAR | Status: AC | PRN
  Administered 2017-06-07: 5 mL

## 2017-06-07 MED ORDER — METHYLPREDNISOLONE ACETATE 40 MG/ML IJ SUSP
40.0000 mg | INTRAMUSCULAR | Status: AC | PRN
  Administered 2017-06-07: 40 mg via INTRA_ARTICULAR

## 2017-06-07 MED ORDER — DICLOFENAC SODIUM 1 % TD GEL: TRANSDERMAL | 0 refills | Status: DC
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
## 2017-07-10 DIAGNOSIS — Z79891 Long term (current) use of opiate analgesic: Secondary | ICD-10-CM | POA: Diagnosis not present

## 2017-07-14 ENCOUNTER — Emergency Department (HOSPITAL_COMMUNITY)
Admission: EM | Admit: 2017-07-14 | Discharge: 2017-07-15 | Disposition: A | Discharge status: Home / Self Care | Payer: Medicare Other | Attending: Emergency Medicine | Admitting: Emergency Medicine

## 2017-07-14 ENCOUNTER — Encounter (HOSPITAL_COMMUNITY): Payer: Self-pay

## 2017-07-14 DIAGNOSIS — Z79899 Other long term (current) drug therapy: Secondary | ICD-10-CM | POA: Diagnosis not present

## 2017-07-14 DIAGNOSIS — F209 Schizophrenia, unspecified: Secondary | ICD-10-CM | POA: Insufficient documentation

## 2017-07-14 DIAGNOSIS — F29 Unspecified psychosis not due to a substance or known physiological condition: Secondary | ICD-10-CM | POA: Diagnosis not present

## 2017-07-14 DIAGNOSIS — F319 Bipolar disorder, unspecified: Secondary | ICD-10-CM | POA: Diagnosis not present

## 2017-07-14 DIAGNOSIS — I10 Essential (primary) hypertension: Secondary | ICD-10-CM | POA: Insufficient documentation

## 2017-07-14 DIAGNOSIS — F17210 Nicotine dependence, cigarettes, uncomplicated: Secondary | ICD-10-CM | POA: Diagnosis not present

## 2017-07-14 DIAGNOSIS — Z760 Encounter for issue of repeat prescription: Secondary | ICD-10-CM | POA: Insufficient documentation

## 2017-07-14 DIAGNOSIS — Z048 Encounter for examination and observation for other specified reasons: Secondary | ICD-10-CM | POA: Diagnosis present

## 2017-07-14 LAB — CBC WITH DIFFERENTIAL/PLATELET
Basophils Absolute: 0.1 10*3/uL (ref 0.0–0.1)
Basophils Relative: 1 %
Eosinophils Absolute: 0.1 10*3/uL (ref 0.0–0.7)
Eosinophils Relative: 1 %
HCT: 35.8 % — ABNORMAL LOW (ref 39.0–52.0)
Hemoglobin: 12.5 g/dL — ABNORMAL LOW (ref 13.0–17.0)
Lymphocytes Relative: 44 %
Lymphs Abs: 5.3 10*3/uL — ABNORMAL HIGH (ref 0.7–4.0)
MCH: 28.9 pg (ref 26.0–34.0)
MCHC: 34.9 g/dL (ref 30.0–36.0)
MCV: 82.9 fL (ref 78.0–100.0)
Monocytes Absolute: 1 10*3/uL (ref 0.1–1.0)
Monocytes Relative: 9 %
Neutro Abs: 5.6 10*3/uL (ref 1.7–7.7)
Neutrophils Relative %: 47 %
Platelets: 294 10*3/uL (ref 150–400)
RBC: 4.32 MIL/uL (ref 4.22–5.81)
RDW: 14.1 % (ref 11.5–15.5)
WBC: 12 10*3/uL — ABNORMAL HIGH (ref 4.0–10.5)

## 2017-07-14 LAB — RAPID URINE DRUG SCREEN, HOSP PERFORMED
Amphetamines: NOT DETECTED
Barbiturates: NOT DETECTED
Benzodiazepines: POSITIVE — AB
Cocaine: POSITIVE — AB
Opiates: NOT DETECTED
Tetrahydrocannabinol: NOT DETECTED

## 2017-07-14 LAB — COMPREHENSIVE METABOLIC PANEL
ALT: 35 U/L (ref 17–63)
AST: 29 U/L (ref 15–41)
Albumin: 3.6 g/dL (ref 3.5–5.0)
Alkaline Phosphatase: 58 U/L (ref 38–126)
Anion gap: 10 (ref 5–15)
BUN: 13 mg/dL (ref 6–20)
CO2: 21 mmol/L — ABNORMAL LOW (ref 22–32)
Calcium: 9 mg/dL (ref 8.9–10.3)
Chloride: 108 mmol/L (ref 101–111)
Creatinine, Ser: 0.99 mg/dL (ref 0.61–1.24)
GFR calc Af Amer: >60 mL/min (ref >60)
GFR calc non Af Amer: >60 mL/min (ref >60)
Glucose, Bld: 96 mg/dL (ref 65–99)
Potassium: 3.9 mmol/L (ref 3.5–5.1)
Sodium: 139 mmol/L (ref 135–145)
Total Bilirubin: 0.6 mg/dL (ref 0.3–1.2)
Total Protein: 6.2 g/dL — ABNORMAL LOW (ref 6.5–8.1)

## 2017-07-14 LAB — ETHANOL: Alcohol, Ethyl (B): <5 mg/dL (ref <5)

## 2017-07-14 MED ORDER — BENZTROPINE MESYLATE 1 MG PO TABS
1.0000 mg | ORAL_TABLET | Freq: Every day | ORAL | Status: DC
  Administered 2017-07-14: 1 mg via ORAL
  Filled 2017-07-14: qty 1

## 2017-07-14 MED ORDER — RISPERIDONE 3 MG PO TABS
4.0000 mg | ORAL_TABLET | ORAL | Status: DC
  Administered 2017-07-14 – 2017-07-15 (×2): 4 mg via ORAL
  Filled 2017-07-14: qty 2
  Filled 2017-07-14: qty 1

## 2017-07-14 MED ORDER — DOXEPIN HCL 10 MG PO CAPS
10.0000 mg | ORAL_CAPSULE | Freq: Every day | ORAL | Status: DC
  Administered 2017-07-14: 10 mg via ORAL
  Filled 2017-07-14: qty 1

## 2017-07-14 MED ORDER — ONDANSETRON HCL 4 MG PO TABS: 4.0000 mg | ORAL_TABLET | Freq: Three times a day (TID) | ORAL | Status: DC | PRN

## 2017-07-14 MED ORDER — HYDROXYZINE HCL 25 MG PO TABS
25.0000 mg | ORAL_TABLET | Freq: Three times a day (TID) | ORAL | Status: DC | PRN
  Administered 2017-07-15: 25 mg via ORAL
  Filled 2017-07-14: qty 1

## 2017-07-14 MED ORDER — NICOTINE 21 MG/24HR TD PT24
21.0000 mg | MEDICATED_PATCH | Freq: Every day | TRANSDERMAL | Status: DC
  Administered 2017-07-14 – 2017-07-15 (×2): 21 mg via TRANSDERMAL
  Filled 2017-07-14 (×2): qty 1

## 2017-07-14 MED ORDER — ACETAMINOPHEN 325 MG PO TABS
650.0000 mg | ORAL_TABLET | ORAL | Status: DC | PRN
  Administered 2017-07-14 – 2017-07-15 (×3): 650 mg via ORAL
  Filled 2017-07-14 (×3): qty 2

## 2017-07-14 MED ORDER — VENLAFAXINE HCL ER 75 MG PO CP24
75.0000 mg | ORAL_CAPSULE | Freq: Every day | ORAL | Status: DC
  Administered 2017-07-14 – 2017-07-15 (×2): 75 mg via ORAL
  Filled 2017-07-14 (×2): qty 1

## 2017-07-14 NOTE — BH Assessment (Signed)
Pt placement referral submitted to the following facilities:   [facility]  [facility]  [facility]  [facility]  [facility]  

## 2017-07-14 NOTE — ED Notes (Signed)
Pt ambulatory to F8 by RN. Per report, pt's belongings placed in [place] #6 and valuables w/security. Pt alert, oriented. Signed and voiced understanding of Medical Clearance Pt Policy - copy given to pt. Pt given coffee and Caff-Free Coke as requested while waiting on lunch. Pt voiced understanding of tx plan - waiting for inpt tx.
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
## 2017-07-14 NOTE — ED Notes (Signed)
Lunch tray ordered; regular diet, no sharps

## 2017-07-14 NOTE — ED Notes (Signed)
Pt noted w/brace to left knee - states given by orthopedic dr for knee pain - intact. Pt able to wiggle toes - no swelling noted.

## 2017-07-14 NOTE — ED Notes (Signed)
EDP at bedside  

## 2017-07-14 NOTE — ED Notes (Signed)
On TTS with counselor.
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
## 2017-07-14 NOTE — ED Notes (Signed)
Pt ambulatory to nurses' desk asking for another cup of coffee - states "I'm trying to stay awake waiting for my lunch tray and I keep falling asleep." Advised pt he may drink the Coke given - voiced understanding and returned to his room.

## 2017-07-15 DIAGNOSIS — F29 Unspecified psychosis not due to a substance or known physiological condition: Secondary | ICD-10-CM | POA: Diagnosis not present

## 2017-07-15 NOTE — ED Notes (Signed)
Re-TTS being performed.  

## 2017-07-15 NOTE — ED Notes (Signed)
Outpt resources given along w/bus pass.

## 2017-07-15 NOTE — ED Notes (Signed)
Advised pt he may shower when staff available to escort him. Verbalized understanding.

## 2017-07-15 NOTE — BHH Counselor (Signed)
Pt presented to [facility] on 07/14/17 with complaint of paranoid ideation.  Pt was reassessed today in presence of L. Romilda Garret, NP.  Pt stated that he is not experiencing current hallucination, suicidal ideation, or paranoia.  Consulted w/L. Romilda Garret, NP who determined that Pt does not meet criteria for inpatient treatment as he has been re-started on his medication.

## 2017-07-15 NOTE — ED Provider Notes (Signed)
Psychiatry has determined patient is stable for discharge from psychiatric perspective. He is alert and nontoxic. He is up and ambulatory without difficulty. Speech is clear and cognitive function normal. No respiratory distress. At this time patient is discharged with resource guide provided for outpatient follow-up.   Charlesetta Shanks, MD 07/15/17 [phone]
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
## 2017-07-15 NOTE — ED Notes (Signed)
Dr Johnney Killian aware [facility] recommends d/c.

## 2017-07-15 NOTE — ED Notes (Signed)
Pt given Vistaril as requested d/t states is experiencing thoughts of his mother and grandmother who have passed.

## 2017-07-15 NOTE — ED Notes (Signed)
Pt ambulatory to shower w/NT.

## 2017-07-16 ENCOUNTER — Other Ambulatory Visit (INDEPENDENT_AMBULATORY_CARE_PROVIDER_SITE_OTHER): Payer: Self-pay | Admitting: Orthopedic Surgery

## 2017-07-19 DIAGNOSIS — M545 Low back pain: Secondary | ICD-10-CM | POA: Diagnosis not present

## 2017-07-19 DIAGNOSIS — G894 Chronic pain syndrome: Secondary | ICD-10-CM | POA: Diagnosis not present

## 2017-07-31 ENCOUNTER — Telehealth (INDEPENDENT_AMBULATORY_CARE_PROVIDER_SITE_OTHER): Payer: Self-pay | Admitting: Orthopedic Surgery

## 2017-07-31 NOTE — Telephone Encounter (Signed)
Pt has been advised that pain clinic in [place] has Denied him, waiting on approval from other pain clinics

## 2017-07-31 NOTE — Telephone Encounter (Signed)
Patient called asked if he has been referred to a pain clinic. Patient said he was suppose to be referred back in March. The number to contact patient is [phone]

## 2017-08-09 ENCOUNTER — Ambulatory Visit (INDEPENDENT_AMBULATORY_CARE_PROVIDER_SITE_OTHER): Payer: Medicare Other | Admitting: Orthopedic Surgery

## 2017-09-18 DIAGNOSIS — Z23 Encounter for immunization: Secondary | ICD-10-CM | POA: Diagnosis not present

## 2017-09-19 ENCOUNTER — Telehealth (INDEPENDENT_AMBULATORY_CARE_PROVIDER_SITE_OTHER): Payer: Self-pay | Admitting: Orthopedic Surgery

## 2017-09-19 DIAGNOSIS — F250 Schizoaffective disorder, bipolar type: Secondary | ICD-10-CM | POA: Diagnosis not present

## 2017-09-19 NOTE — Telephone Encounter (Signed)
Patient called asking about his referral to the pain clinic. If you could give him a call back as soon as possible. Thank you. [phone]

## 2017-09-19 NOTE — Telephone Encounter (Signed)
IC pt back left message to return call

## 2017-09-25 NOTE — Telephone Encounter (Signed)
Tried calling pt again no answer

## 2017-09-26 NOTE — Telephone Encounter (Signed)
I have tried calling pt back X 3 no answer, left vm to return my call

## 2017-09-28 ENCOUNTER — Telehealth (INDEPENDENT_AMBULATORY_CARE_PROVIDER_SITE_OTHER): Payer: Self-pay | Admitting: Orthopedic Surgery

## 2017-09-28 NOTE — Telephone Encounter (Signed)
I called pt againg this am, no answer. Closing phone note due to no response after numerous attempts

## 2017-09-28 NOTE — Telephone Encounter (Signed)
Spoke to pt he called about pain management

## 2017-10-03 NOTE — Telephone Encounter (Signed)
I have tried calling this pt numerous times, left messages, even tried calling to make sure I wasn't hitting wrong number. I will wait for pt to call me.

## 2017-10-04 DIAGNOSIS — F250 Schizoaffective disorder, bipolar type: Secondary | ICD-10-CM | POA: Diagnosis not present

## 2017-10-05 ENCOUNTER — Other Ambulatory Visit (INDEPENDENT_AMBULATORY_CARE_PROVIDER_SITE_OTHER): Payer: Self-pay | Admitting: *Deleted

## 2017-10-05 DIAGNOSIS — G8929 Other chronic pain: Secondary | ICD-10-CM

## 2017-10-05 DIAGNOSIS — M25562 Pain in left knee: Principal | ICD-10-CM

## 2017-10-05 DIAGNOSIS — G894 Chronic pain syndrome: Secondary | ICD-10-CM

## 2017-10-05 DIAGNOSIS — M25561 Pain in right knee: Principal | ICD-10-CM

## 2017-10-05 NOTE — Telephone Encounter (Signed)
Pt called back and stated that he would like to go to [facility] in [place], I will submit a new referral for pain mgmt. Pt voiced understanding that it would take a couple weeks for review.

## 2017-10-05 NOTE — Telephone Encounter (Signed)
Referral submitted to [facility] in [place]

## 2017-10-08 DIAGNOSIS — Z79891 Long term (current) use of opiate analgesic: Secondary | ICD-10-CM | POA: Diagnosis not present

## 2017-10-11 ENCOUNTER — Emergency Department (HOSPITAL_COMMUNITY)
Admission: EM | Admit: 2017-10-11 | Discharge: 2017-10-11 | Disposition: A | Discharge status: Home / Self Care | Payer: Medicare Other | Attending: Emergency Medicine | Admitting: Emergency Medicine

## 2017-10-11 ENCOUNTER — Emergency Department (HOSPITAL_COMMUNITY): Payer: Medicare Other

## 2017-10-11 ENCOUNTER — Encounter (HOSPITAL_COMMUNITY): Payer: Self-pay | Admitting: Radiology

## 2017-10-11 DIAGNOSIS — R0981 Nasal congestion: Secondary | ICD-10-CM | POA: Diagnosis not present

## 2017-10-11 DIAGNOSIS — F17210 Nicotine dependence, cigarettes, uncomplicated: Secondary | ICD-10-CM | POA: Insufficient documentation

## 2017-10-11 DIAGNOSIS — M545 Low back pain: Secondary | ICD-10-CM | POA: Insufficient documentation

## 2017-10-11 DIAGNOSIS — R220 Localized swelling, mass and lump, head: Secondary | ICD-10-CM | POA: Diagnosis not present

## 2017-10-11 DIAGNOSIS — G8929 Other chronic pain: Secondary | ICD-10-CM | POA: Insufficient documentation

## 2017-10-11 DIAGNOSIS — J3489 Other specified disorders of nose and nasal sinuses: Secondary | ICD-10-CM | POA: Diagnosis not present

## 2017-10-11 DIAGNOSIS — R4781 Slurred speech: Secondary | ICD-10-CM | POA: Insufficient documentation

## 2017-10-11 DIAGNOSIS — I10 Essential (primary) hypertension: Secondary | ICD-10-CM | POA: Insufficient documentation

## 2017-10-11 DIAGNOSIS — R29818 Other symptoms and signs involving the nervous system: Secondary | ICD-10-CM | POA: Diagnosis not present

## 2017-10-11 DIAGNOSIS — R29810 Facial weakness: Secondary | ICD-10-CM | POA: Insufficient documentation

## 2017-10-11 DIAGNOSIS — Z8673 Personal history of transient ischemic attack (TIA), and cerebral infarction without residual deficits: Secondary | ICD-10-CM | POA: Diagnosis not present

## 2017-10-11 DIAGNOSIS — H539 Unspecified visual disturbance: Secondary | ICD-10-CM | POA: Diagnosis not present

## 2017-10-11 DIAGNOSIS — H538 Other visual disturbances: Secondary | ICD-10-CM | POA: Diagnosis not present

## 2017-10-11 DIAGNOSIS — R9431 Abnormal electrocardiogram [ECG] [EKG]: Secondary | ICD-10-CM | POA: Diagnosis not present

## 2017-10-11 LAB — DIFFERENTIAL
Basophils Absolute: 0 10*3/uL (ref 0.0–0.1)
Basophils Relative: 0 %
Eosinophils Absolute: 0.1 10*3/uL (ref 0.0–0.7)
Eosinophils Relative: 1 %
Lymphocytes Relative: 44 %
Lymphs Abs: 4.1 10*3/uL — ABNORMAL HIGH (ref 0.7–4.0)
Monocytes Absolute: 0.6 10*3/uL (ref 0.1–1.0)
Monocytes Relative: 6 %
Neutro Abs: 4.5 10*3/uL (ref 1.7–7.7)
Neutrophils Relative %: 49 %

## 2017-10-11 LAB — COMPREHENSIVE METABOLIC PANEL
ALT: 29 U/L (ref 17–63)
AST: 25 U/L (ref 15–41)
Albumin: 3.6 g/dL (ref 3.5–5.0)
Alkaline Phosphatase: 84 U/L (ref 38–126)
Anion gap: 11 (ref 5–15)
BUN: 9 mg/dL (ref 6–20)
CO2: 23 mmol/L (ref 22–32)
Calcium: 9 mg/dL (ref 8.9–10.3)
Chloride: 99 mmol/L — ABNORMAL LOW (ref 101–111)
Creatinine, Ser: 0.79 mg/dL (ref 0.61–1.24)
GFR calc Af Amer: >60 mL/min (ref >60)
GFR calc non Af Amer: >60 mL/min (ref >60)
Glucose, Bld: 146 mg/dL — ABNORMAL HIGH (ref 65–99)
Potassium: 3.8 mmol/L (ref 3.5–5.1)
Sodium: 133 mmol/L — ABNORMAL LOW (ref 135–145)
Total Bilirubin: 0.4 mg/dL (ref 0.3–1.2)
Total Protein: 7 g/dL (ref 6.5–8.1)

## 2017-10-11 LAB — CBC
HCT: 40.1 % (ref 39.0–52.0)
Hemoglobin: 13.2 g/dL (ref 13.0–17.0)
MCH: 27.8 pg (ref 26.0–34.0)
MCHC: 32.9 g/dL (ref 30.0–36.0)
MCV: 84.4 fL (ref 78.0–100.0)
Platelets: 332 10*3/uL (ref 150–400)
RBC: 4.75 MIL/uL (ref 4.22–5.81)
RDW: 13.5 % (ref 11.5–15.5)
WBC: 9.4 10*3/uL (ref 4.0–10.5)

## 2017-10-11 LAB — I-STAT CHEM 8, ED
BUN: 11 mg/dL (ref 6–20)
Calcium, Ion: 1.19 mmol/L (ref 1.15–1.40)
Chloride: 101 mmol/L (ref 101–111)
Creatinine, Ser: 0.7 mg/dL (ref 0.61–1.24)
Glucose, Bld: 148 mg/dL — ABNORMAL HIGH (ref 65–99)
HCT: 42 % (ref 39.0–52.0)
Hemoglobin: 14.3 g/dL (ref 13.0–17.0)
Potassium: 4 mmol/L (ref 3.5–5.1)
Sodium: 137 mmol/L (ref 135–145)
TCO2: 26 mmol/L (ref 22–32)

## 2017-10-11 LAB — CBG MONITORING, ED: Glucose-Capillary: 114 mg/dL — ABNORMAL HIGH (ref 65–99)

## 2017-10-11 LAB — APTT: aPTT: 29 seconds (ref 24–36)

## 2017-10-11 LAB — PROTIME-INR
INR: 0.9
Prothrombin Time: 12.1 seconds (ref 11.4–15.2)

## 2017-10-11 LAB — I-STAT TROPONIN, ED: Troponin i, poc: 0 ng/mL (ref 0.00–0.08)

## 2017-10-11 MED ORDER — CETIRIZINE HCL 10 MG PO CAPS: 10.0000 mg | ORAL_CAPSULE | Freq: Every day | ORAL | 0 refills | Status: DC
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
## 2017-10-11 NOTE — ED Provider Notes (Signed)
MSE was initiated and I personally evaluated the patient and placed orders (if any) at  3:09 PM on October 11, 2017.  I was asked to see this patient briefly to help start workup. He has multiple complaints but his primary complaint is bilateral blurry vision that started 2 days ago. He's also having a scab in between his eyes that he's concerned about that he does not remember an injury. He feels subjective facial swelling that I do not significantly appreciate. However since the blurry vision started he's also had some slurred speech. He has a trace left facial droop and his left eye does not close strongly says right. He has had Bell's palsy but states he thinks he was completely recovered. There are no other signs of acute stroke. Given the slurred speech with all of this, stroke workup will be started. However he is well outside of the code stroke window.   The patient appears stable so that the remainder of the MSE may be completed by another provider.     Sherwood Gambler, MD 10/11/17 [phone]
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
## 2017-10-11 NOTE — Discharge Instructions (Signed)
Followup with primary doctor at first availability. Return to ED for difficulty breathing or numbness/weakness of one area of your body

## 2017-10-30 ENCOUNTER — Telehealth (INDEPENDENT_AMBULATORY_CARE_PROVIDER_SITE_OTHER): Payer: Self-pay | Admitting: Orthopedic Surgery

## 2017-10-30 NOTE — Telephone Encounter (Signed)
Patient called advised he was suppose to be referred to the pain clinic and have not had a call from anyone. Patient advised he was suppose to be referred back in March. Patient said he was told he was going to be set up some time ago. The number to contact patient is [phone]

## 2017-10-30 NOTE — Telephone Encounter (Signed)
This pt is calling to check the status of his pain management referral

## 2017-10-31 NOTE — Telephone Encounter (Signed)
IC pt advised him that I had faxed his referral to [facility] pain mgmt back on 10/16, I gave pt number to call and see if can get appt.

## 2017-11-01 ENCOUNTER — Ambulatory Visit (INDEPENDENT_AMBULATORY_CARE_PROVIDER_SITE_OTHER): Payer: Medicare Other | Admitting: Orthopedic Surgery

## 2017-11-05 DIAGNOSIS — F250 Schizoaffective disorder, bipolar type: Secondary | ICD-10-CM | POA: Diagnosis not present

## 2017-11-21 DIAGNOSIS — M545 Low back pain: Secondary | ICD-10-CM | POA: Diagnosis not present

## 2017-11-21 DIAGNOSIS — G890 Central pain syndrome: Secondary | ICD-10-CM | POA: Diagnosis not present

## 2017-11-21 DIAGNOSIS — M546 Pain in thoracic spine: Secondary | ICD-10-CM | POA: Diagnosis not present

## 2017-11-21 DIAGNOSIS — G608 Other hereditary and idiopathic neuropathies: Secondary | ICD-10-CM | POA: Diagnosis not present

## 2017-11-21 DIAGNOSIS — G603 Idiopathic progressive neuropathy: Secondary | ICD-10-CM | POA: Diagnosis not present

## 2017-12-05 DIAGNOSIS — Z79891 Long term (current) use of opiate analgesic: Secondary | ICD-10-CM | POA: Diagnosis not present

## 2017-12-05 DIAGNOSIS — G8929 Other chronic pain: Secondary | ICD-10-CM | POA: Diagnosis not present

## 2017-12-05 DIAGNOSIS — G894 Chronic pain syndrome: Secondary | ICD-10-CM | POA: Diagnosis not present

## 2017-12-05 DIAGNOSIS — M25561 Pain in right knee: Secondary | ICD-10-CM | POA: Diagnosis not present

## 2017-12-05 DIAGNOSIS — M25562 Pain in left knee: Secondary | ICD-10-CM | POA: Diagnosis not present

## 2017-12-06 ENCOUNTER — Encounter (HOSPITAL_COMMUNITY): Payer: Self-pay | Admitting: Emergency Medicine

## 2017-12-06 ENCOUNTER — Other Ambulatory Visit: Payer: Self-pay

## 2017-12-06 ENCOUNTER — Emergency Department (HOSPITAL_COMMUNITY)
Admission: EM | Admit: 2017-12-06 | Discharge: 2017-12-06 | Disposition: A | Discharge status: Home / Self Care | Payer: Medicare Other | Attending: Emergency Medicine | Admitting: Emergency Medicine

## 2017-12-06 DIAGNOSIS — R100 Acute abdomen: Secondary | ICD-10-CM | POA: Diagnosis not present

## 2017-12-06 DIAGNOSIS — R52 Pain, unspecified: Secondary | ICD-10-CM | POA: Diagnosis not present

## 2017-12-06 DIAGNOSIS — F17210 Nicotine dependence, cigarettes, uncomplicated: Secondary | ICD-10-CM | POA: Insufficient documentation

## 2017-12-06 DIAGNOSIS — Z8673 Personal history of transient ischemic attack (TIA), and cerebral infarction without residual deficits: Secondary | ICD-10-CM | POA: Insufficient documentation

## 2017-12-06 DIAGNOSIS — M5489 Other dorsalgia: Secondary | ICD-10-CM | POA: Diagnosis not present

## 2017-12-06 DIAGNOSIS — M62838 Other muscle spasm: Secondary | ICD-10-CM | POA: Diagnosis not present

## 2017-12-06 DIAGNOSIS — Z79899 Other long term (current) drug therapy: Secondary | ICD-10-CM | POA: Diagnosis not present

## 2017-12-06 DIAGNOSIS — I10 Essential (primary) hypertension: Secondary | ICD-10-CM | POA: Insufficient documentation

## 2017-12-06 LAB — CBC WITH DIFFERENTIAL/PLATELET
Basophils Absolute: 0.1 10*3/uL (ref 0.0–0.1)
Basophils Relative: 0 %
Eosinophils Absolute: 0 10*3/uL (ref 0.0–0.7)
Eosinophils Relative: 0 %
HCT: 42 % (ref 39.0–52.0)
Hemoglobin: 14.1 g/dL (ref 13.0–17.0)
Lymphocytes Relative: 29 %
Lymphs Abs: 3.6 10*3/uL (ref 0.7–4.0)
MCH: 27.8 pg (ref 26.0–34.0)
MCHC: 33.6 g/dL (ref 30.0–36.0)
MCV: 82.7 fL (ref 78.0–100.0)
Monocytes Absolute: 0.6 10*3/uL (ref 0.1–1.0)
Monocytes Relative: 5 %
Neutro Abs: 8 10*3/uL — ABNORMAL HIGH (ref 1.7–7.7)
Neutrophils Relative %: 66 %
Platelets: 331 10*3/uL (ref 150–400)
RBC: 5.08 MIL/uL (ref 4.22–5.81)
RDW: 13.9 % (ref 11.5–15.5)
WBC: 12.3 10*3/uL — ABNORMAL HIGH (ref 4.0–10.5)

## 2017-12-06 LAB — COMPREHENSIVE METABOLIC PANEL
ALT: 48 U/L (ref 17–63)
AST: 32 U/L (ref 15–41)
Albumin: 3.8 g/dL (ref 3.5–5.0)
Alkaline Phosphatase: 73 U/L (ref 38–126)
Anion gap: 12 (ref 5–15)
BUN: 9 mg/dL (ref 6–20)
CO2: 22 mmol/L (ref 22–32)
Calcium: 9.4 mg/dL (ref 8.9–10.3)
Chloride: 98 mmol/L — ABNORMAL LOW (ref 101–111)
Creatinine, Ser: 1.22 mg/dL (ref 0.61–1.24)
GFR calc Af Amer: >60 mL/min (ref >60)
GFR calc non Af Amer: >60 mL/min (ref >60)
Glucose, Bld: 127 mg/dL — ABNORMAL HIGH (ref 65–99)
Potassium: 4.2 mmol/L (ref 3.5–5.1)
Sodium: 132 mmol/L — ABNORMAL LOW (ref 135–145)
Total Bilirubin: 0.3 mg/dL (ref 0.3–1.2)
Total Protein: 7.2 g/dL (ref 6.5–8.1)

## 2017-12-06 LAB — CK: Total CK: 324 U/L (ref 49–397)

## 2017-12-06 MED ORDER — SODIUM CHLORIDE 0.9 % IV BOLUS (SEPSIS)
1000.0000 mL | Freq: Once | INTRAVENOUS | Status: AC
  Administered 2017-12-06: 1000 mL via INTRAVENOUS

## 2017-12-06 MED ORDER — METAXALONE 800 MG PO TABS: 800.0000 mg | ORAL_TABLET | Freq: Three times a day (TID) | ORAL | 0 refills | Status: DC

## 2017-12-06 MED ORDER — ACETAMINOPHEN 500 MG PO TABS
1000.0000 mg | ORAL_TABLET | Freq: Once | ORAL | Status: AC
  Administered 2017-12-06: 1000 mg via ORAL
  Filled 2017-12-06: qty 2

## 2017-12-06 NOTE — ED Notes (Signed)
Pt provided with [place] sandwich and coffee

## 2017-12-06 NOTE — ED Notes (Signed)
Pt ambulatory up to nurse first desk requesting to know how much longer he will be waiting, advised we are working to get him a room and that it will be a little longer before he is seen by a doctor.

## 2017-12-06 NOTE — Discharge Instructions (Signed)
Return here as needed. follow-up with your doctor.  Increase your fluid intake.

## 2017-12-06 NOTE — ED Triage Notes (Signed)
Pt arrives via [facility] for c/o generalized cramping, pt a/ox4, vss, nad.

## 2017-12-06 NOTE — ED Notes (Signed)
Pt requesting to know how much longer he will be waiting. Pt advised he still has a few people in front of him but that we are working to get him back to be seen by a provider as soon as possible.
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
## 2017-12-18 ENCOUNTER — Other Ambulatory Visit: Payer: Self-pay

## 2017-12-18 ENCOUNTER — Encounter (HOSPITAL_COMMUNITY): Payer: Self-pay | Admitting: *Deleted

## 2017-12-18 ENCOUNTER — Emergency Department (HOSPITAL_COMMUNITY)
Admission: EM | Admit: 2017-12-18 | Discharge: 2017-12-19 | Disposition: A | Discharge status: Home / Self Care | Payer: Medicare Other | Attending: Emergency Medicine | Admitting: Emergency Medicine

## 2017-12-18 DIAGNOSIS — F17210 Nicotine dependence, cigarettes, uncomplicated: Secondary | ICD-10-CM | POA: Insufficient documentation

## 2017-12-18 DIAGNOSIS — W3400XA Accidental discharge from unspecified firearms or gun, initial encounter: Secondary | ICD-10-CM

## 2017-12-18 DIAGNOSIS — F259 Schizoaffective disorder, unspecified: Secondary | ICD-10-CM | POA: Diagnosis present

## 2017-12-18 DIAGNOSIS — I10 Essential (primary) hypertension: Secondary | ICD-10-CM | POA: Insufficient documentation

## 2017-12-18 DIAGNOSIS — F918 Other conduct disorders: Secondary | ICD-10-CM | POA: Diagnosis not present

## 2017-12-18 DIAGNOSIS — R456 Violent behavior: Secondary | ICD-10-CM | POA: Diagnosis not present

## 2017-12-18 DIAGNOSIS — Z79899 Other long term (current) drug therapy: Secondary | ICD-10-CM | POA: Insufficient documentation

## 2017-12-18 DIAGNOSIS — R4689 Other symptoms and signs involving appearance and behavior: Secondary | ICD-10-CM

## 2017-12-18 LAB — ETHANOL: Alcohol, Ethyl (B): <10 mg/dL (ref <10)

## 2017-12-18 LAB — COMPREHENSIVE METABOLIC PANEL
ALT: 40 U/L (ref 17–63)
AST: 36 U/L (ref 15–41)
Albumin: 4.1 g/dL (ref 3.5–5.0)
Alkaline Phosphatase: 77 U/L (ref 38–126)
Anion gap: 11 (ref 5–15)
BUN: 18 mg/dL (ref 6–20)
CO2: 24 mmol/L (ref 22–32)
Calcium: 9.4 mg/dL (ref 8.9–10.3)
Chloride: 101 mmol/L (ref 101–111)
Creatinine, Ser: 1.12 mg/dL (ref 0.61–1.24)
GFR calc Af Amer: >60 mL/min (ref >60)
GFR calc non Af Amer: >60 mL/min (ref >60)
Glucose, Bld: 148 mg/dL — ABNORMAL HIGH (ref 65–99)
Potassium: 4.3 mmol/L (ref 3.5–5.1)
Sodium: 136 mmol/L (ref 135–145)
Total Bilirubin: 0.7 mg/dL (ref 0.3–1.2)
Total Protein: 8 g/dL (ref 6.5–8.1)

## 2017-12-18 LAB — RAPID URINE DRUG SCREEN, HOSP PERFORMED
Amphetamines: NOT DETECTED
Barbiturates: NOT DETECTED
Benzodiazepines: NOT DETECTED
Cocaine: NOT DETECTED
Opiates: NOT DETECTED
Tetrahydrocannabinol: NOT DETECTED

## 2017-12-18 LAB — CBC
HCT: 42.2 % (ref 39.0–52.0)
Hemoglobin: 14.2 g/dL (ref 13.0–17.0)
MCH: 28 pg (ref 26.0–34.0)
MCHC: 33.6 g/dL (ref 30.0–36.0)
MCV: 83.2 fL (ref 78.0–100.0)
Platelets: 333 10*3/uL (ref 150–400)
RBC: 5.07 MIL/uL (ref 4.22–5.81)
RDW: 13.9 % (ref 11.5–15.5)
WBC: 16.9 10*3/uL — ABNORMAL HIGH (ref 4.0–10.5)

## 2017-12-18 MED ORDER — LISINOPRIL 10 MG PO TABS
10.0000 mg | ORAL_TABLET | Freq: Every day | ORAL | Status: DC
  Administered 2017-12-18: 10 mg via ORAL
  Filled 2017-12-18 (×2): qty 1

## 2017-12-18 MED ORDER — HYDROXYZINE HCL 25 MG PO TABS
25.0000 mg | ORAL_TABLET | ORAL | Status: DC | PRN
  Administered 2017-12-18: 25 mg via ORAL
  Filled 2017-12-18: qty 1

## 2017-12-18 MED ORDER — NICOTINE 21 MG/24HR TD PT24
21.0000 mg | MEDICATED_PATCH | Freq: Every day | TRANSDERMAL | Status: DC
  Administered 2017-12-18: 21 mg via TRANSDERMAL
  Filled 2017-12-18: qty 1

## 2017-12-18 MED ORDER — LISINOPRIL-HYDROCHLOROTHIAZIDE 10-12.5 MG PO TABS
1.0000 | ORAL_TABLET | Freq: Every day | ORAL | Status: DC
Start: 2017-12-18 — End: 2017-12-18

## 2017-12-18 MED ORDER — RISPERIDONE 2 MG PO TABS
4.0000 mg | ORAL_TABLET | ORAL | Status: DC
  Administered 2017-12-18 – 2017-12-19 (×2): 4 mg via ORAL
  Filled 2017-12-18 (×2): qty 2

## 2017-12-18 MED ORDER — ALUM & MAG HYDROXIDE-SIMETH 200-200-20 MG/5ML PO SUSP: 30.0000 mL | Freq: Four times a day (QID) | ORAL | Status: DC | PRN

## 2017-12-18 MED ORDER — DOXEPIN HCL 10 MG PO CAPS
10.0000 mg | ORAL_CAPSULE | Freq: Every day | ORAL | Status: DC
  Administered 2017-12-18: 10 mg via ORAL
  Filled 2017-12-18 (×2): qty 1

## 2017-12-18 MED ORDER — VENLAFAXINE HCL ER 75 MG PO CP24
75.0000 mg | ORAL_CAPSULE | Freq: Every day | ORAL | Status: DC
  Administered 2017-12-18 – 2017-12-19 (×2): 75 mg via ORAL
  Filled 2017-12-18 (×2): qty 1

## 2017-12-18 MED ORDER — HYDROCHLOROTHIAZIDE 12.5 MG PO CAPS
12.5000 mg | ORAL_CAPSULE | Freq: Every day | ORAL | Status: DC
  Administered 2017-12-18: 12.5 mg via ORAL
  Filled 2017-12-18 (×2): qty 1

## 2017-12-18 MED ORDER — IBUPROFEN 800 MG PO TABS
800.0000 mg | ORAL_TABLET | Freq: Once | ORAL | Status: AC
  Administered 2017-12-18: 800 mg via ORAL
  Filled 2017-12-18: qty 1

## 2017-12-18 MED ORDER — BENZTROPINE MESYLATE 1 MG PO TABS
1.0000 mg | ORAL_TABLET | Freq: Every day | ORAL | Status: DC
  Administered 2017-12-18: 1 mg via ORAL
  Filled 2017-12-18: qty 1

## 2017-12-18 MED ORDER — CYCLOBENZAPRINE HCL 10 MG PO TABS
10.0000 mg | ORAL_TABLET | Freq: Three times a day (TID) | ORAL | Status: DC | PRN
Start: 2017-12-18 — End: 2017-12-19
  Administered 2017-12-18 (×2): 10 mg via ORAL
  Filled 2017-12-18 (×2): qty 1

## 2017-12-18 MED ORDER — ONDANSETRON HCL 4 MG PO TABS: 4.0000 mg | ORAL_TABLET | Freq: Three times a day (TID) | ORAL | Status: DC | PRN

## 2017-12-18 NOTE — ED Notes (Signed)
Bed: EO71 Expected date:  Expected time:  Means of arrival:  Comments: 51 yo [employer] IVC
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
## 2017-12-18 NOTE — ED Notes (Signed)
Patient seen sleeping. No distress noted. Will continue to monitor patient.
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
## 2017-12-18 NOTE — BH Assessment (Signed)
[place] Assessment Progress Note  Romilda Garret FNP and Arfeen MD recommended patient be monitored for safety and observed. Patient will be seen by psychiatry in the a.m.

## 2017-12-18 NOTE — ED Notes (Signed)
TTS at bedside. 

## 2017-12-18 NOTE — ED Notes (Signed)
Patient has been calm and cooperative this shift.  He continues to deny SI/HI/AVH.  He is oriented x4.  He is compliant with scheduled medications.  Continue to monitor q15 minutes.  Continuue support and encouragement.
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
## 2017-12-19 DIAGNOSIS — F918 Other conduct disorders: Secondary | ICD-10-CM | POA: Diagnosis not present

## 2017-12-19 DIAGNOSIS — G8929 Other chronic pain: Secondary | ICD-10-CM | POA: Diagnosis not present

## 2017-12-19 DIAGNOSIS — M545 Low back pain: Secondary | ICD-10-CM | POA: Diagnosis not present

## 2017-12-19 DIAGNOSIS — M25552 Pain in left hip: Secondary | ICD-10-CM | POA: Diagnosis not present

## 2017-12-19 DIAGNOSIS — M25551 Pain in right hip: Secondary | ICD-10-CM | POA: Diagnosis not present

## 2017-12-19 NOTE — Discharge Instructions (Signed)
For your behavioral health needs, you are advised to continue treatment with [facility]:       [facility]      [address]      [place], [place] [address]      [phone]

## 2017-12-19 NOTE — BH Assessment (Signed)
[facility] Assessment Progress Note  Per Jacob Dresser, Jacob [name], this pt does not require psychiatric hospitalization at this time.  Pt presents under IVC initiated by his brother, which Dr Jacob [name] has rescinded.  Pt is to be discharged from [facility] with recommendation to continue treatment with [facility].  This has been included in pt's discharge instructions.  Pt's nurse, Caryl Pina, has been notified.  Jalene Mullet, [place] Triage Specialist [phone]

## 2017-12-19 NOTE — ED Notes (Signed)
Pt discharged ambulatory with discharge instructions.  All belongings were returned to patient.  Pt was in no distress .

## 2017-12-19 NOTE — BH Assessment (Signed)
[place] Assessment Progress Note  Pt reports this AM that he has an appt at the pain clinic today at 11am. He says his friend, Keane Scrape [phone]), will pick him up from the ED and take him to his appt and then take him home. Clinician called Keane Scrape and ver'd that he would pick pt up when he was ready to be d/c.   Kenna Gilbert. Lovena Le, [facility], [place], [facility] Counselor
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
## 2017-12-26 ENCOUNTER — Telehealth (INDEPENDENT_AMBULATORY_CARE_PROVIDER_SITE_OTHER): Payer: Self-pay | Admitting: Orthopedic Surgery

## 2017-12-26 NOTE — Telephone Encounter (Signed)
Insurance company called asking for office notes to be faxed over for brace. CB # [phone] Fax # [phone]

## 2017-12-26 NOTE — Telephone Encounter (Signed)
Faxed last office note to [phone]

## 2018-01-16 DIAGNOSIS — M79602 Pain in left arm: Secondary | ICD-10-CM | POA: Diagnosis not present

## 2018-01-16 DIAGNOSIS — M79605 Pain in left leg: Secondary | ICD-10-CM | POA: Diagnosis not present

## 2018-01-16 DIAGNOSIS — M79604 Pain in right leg: Secondary | ICD-10-CM | POA: Diagnosis not present

## 2018-01-16 DIAGNOSIS — G541 Lumbosacral plexus disorders: Secondary | ICD-10-CM | POA: Diagnosis not present

## 2018-01-16 DIAGNOSIS — M545 Low back pain: Secondary | ICD-10-CM | POA: Diagnosis not present

## 2018-01-16 DIAGNOSIS — G8929 Other chronic pain: Secondary | ICD-10-CM | POA: Diagnosis not present

## 2018-01-16 DIAGNOSIS — M79601 Pain in right arm: Secondary | ICD-10-CM | POA: Diagnosis not present

## 2018-01-20 ENCOUNTER — Emergency Department (HOSPITAL_COMMUNITY)
Admission: EM | Admit: 2018-01-20 | Discharge: 2018-01-20 | Disposition: A | Discharge status: Home / Self Care | Payer: Medicare Other | Attending: Emergency Medicine | Admitting: Emergency Medicine

## 2018-01-20 ENCOUNTER — Encounter (HOSPITAL_COMMUNITY): Payer: Self-pay

## 2018-01-20 ENCOUNTER — Emergency Department (HOSPITAL_COMMUNITY): Payer: Medicare Other

## 2018-01-20 DIAGNOSIS — R05 Cough: Secondary | ICD-10-CM | POA: Insufficient documentation

## 2018-01-20 DIAGNOSIS — M62838 Other muscle spasm: Secondary | ICD-10-CM | POA: Diagnosis not present

## 2018-01-20 DIAGNOSIS — R252 Cramp and spasm: Secondary | ICD-10-CM | POA: Insufficient documentation

## 2018-01-20 DIAGNOSIS — Z8673 Personal history of transient ischemic attack (TIA), and cerebral infarction without residual deficits: Secondary | ICD-10-CM | POA: Insufficient documentation

## 2018-01-20 DIAGNOSIS — S6990XA Unspecified injury of unspecified wrist, hand and finger(s), initial encounter: Secondary | ICD-10-CM | POA: Diagnosis not present

## 2018-01-20 DIAGNOSIS — I10 Essential (primary) hypertension: Secondary | ICD-10-CM | POA: Insufficient documentation

## 2018-01-20 DIAGNOSIS — Z79899 Other long term (current) drug therapy: Secondary | ICD-10-CM | POA: Insufficient documentation

## 2018-01-20 DIAGNOSIS — F17210 Nicotine dependence, cigarettes, uncomplicated: Secondary | ICD-10-CM | POA: Diagnosis not present

## 2018-01-20 DIAGNOSIS — M24542 Contracture, left hand: Secondary | ICD-10-CM | POA: Diagnosis not present

## 2018-01-20 LAB — I-STAT CHEM 8, ED
BUN: 15 mg/dL (ref 6–20)
Calcium, Ion: 1.11 mmol/L — ABNORMAL LOW (ref 1.15–1.40)
Chloride: 102 mmol/L (ref 101–111)
Creatinine, Ser: 0.8 mg/dL (ref 0.61–1.24)
Glucose, Bld: 184 mg/dL — ABNORMAL HIGH (ref 65–99)
HCT: 43 % (ref 39.0–52.0)
Hemoglobin: 14.6 g/dL (ref 13.0–17.0)
Potassium: 4.1 mmol/L (ref 3.5–5.1)
Sodium: 135 mmol/L (ref 135–145)
TCO2: 24 mmol/L (ref 22–32)

## 2018-01-20 MED ORDER — ACETAMINOPHEN 500 MG PO TABS
1000.0000 mg | ORAL_TABLET | Freq: Once | ORAL | Status: AC
  Administered 2018-01-20: 1000 mg via ORAL
  Filled 2018-01-20: qty 2

## 2018-01-20 MED ORDER — METHOCARBAMOL 500 MG PO TABS
1000.0000 mg | ORAL_TABLET | Freq: Once | ORAL | Status: AC
  Administered 2018-01-20: 1000 mg via ORAL
  Filled 2018-01-20: qty 2

## 2018-01-20 MED ORDER — METHOCARBAMOL 500 MG PO TABS: 500.0000 mg | ORAL_TABLET | Freq: Two times a day (BID) | ORAL | 0 refills | Status: DC

## 2018-01-20 MED ORDER — NAPROXEN 250 MG PO TABS
500.0000 mg | ORAL_TABLET | Freq: Once | ORAL | Status: AC
Start: 2018-01-20 — End: 2018-01-20
  Administered 2018-01-20: 500 mg via ORAL
  Filled 2018-01-20: qty 2

## 2018-01-20 NOTE — ED Triage Notes (Signed)
Pt comes via [facility] for hand cramping that goes up to his arm. Pt also adds that he has had a cough for the past five days, grey sputum
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
## 2018-01-20 NOTE — ED Notes (Signed)
Pt verbalizes understanding of d/c instructions. Pt received prescriptions. Pt ambulatory at d/c with all belongings.  

## 2018-01-23 ENCOUNTER — Encounter (HOSPITAL_COMMUNITY): Payer: Self-pay | Admitting: Emergency Medicine

## 2018-01-23 ENCOUNTER — Emergency Department (HOSPITAL_COMMUNITY)
Admission: EM | Admit: 2018-01-23 | Discharge: 2018-01-23 | Disposition: A | Discharge status: Home / Self Care | Payer: Medicare Other | Attending: Emergency Medicine | Admitting: Emergency Medicine

## 2018-01-23 DIAGNOSIS — Z8673 Personal history of transient ischemic attack (TIA), and cerebral infarction without residual deficits: Secondary | ICD-10-CM | POA: Diagnosis not present

## 2018-01-23 DIAGNOSIS — I10 Essential (primary) hypertension: Secondary | ICD-10-CM | POA: Diagnosis not present

## 2018-01-23 DIAGNOSIS — S8990XA Unspecified injury of unspecified lower leg, initial encounter: Secondary | ICD-10-CM | POA: Diagnosis not present

## 2018-01-23 DIAGNOSIS — F17210 Nicotine dependence, cigarettes, uncomplicated: Secondary | ICD-10-CM | POA: Insufficient documentation

## 2018-01-23 DIAGNOSIS — Z79899 Other long term (current) drug therapy: Secondary | ICD-10-CM | POA: Diagnosis not present

## 2018-01-23 DIAGNOSIS — M62838 Other muscle spasm: Secondary | ICD-10-CM | POA: Diagnosis not present

## 2018-01-23 DIAGNOSIS — M791 Myalgia, unspecified site: Secondary | ICD-10-CM | POA: Diagnosis not present

## 2018-01-23 DIAGNOSIS — G8911 Acute pain due to trauma: Secondary | ICD-10-CM | POA: Diagnosis not present

## 2018-01-23 MED ORDER — SODIUM CHLORIDE 0.9 % IV BOLUS (SEPSIS)
1000.0000 mL | Freq: Once | INTRAVENOUS | Status: AC
  Administered 2018-01-23: 1000 mL via INTRAVENOUS

## 2018-01-23 MED ORDER — ACETAMINOPHEN 325 MG PO TABS
650.0000 mg | ORAL_TABLET | Freq: Once | ORAL | Status: AC
Start: 2018-01-23 — End: 2018-01-23
  Administered 2018-01-23: 650 mg via ORAL
  Filled 2018-01-23: qty 2

## 2018-01-23 NOTE — ED Triage Notes (Signed)
BIB [facility] from home, pt reports spasms all over for past few days. Pt was recently seen here for same.
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
## 2018-01-23 NOTE — ED Triage Notes (Addendum)
Pt states Flexeril that was prescribed on 1/27** isnt working and he wants something stronger

## 2018-02-15 ENCOUNTER — Encounter (HOSPITAL_COMMUNITY): Payer: Self-pay | Admitting: Emergency Medicine

## 2018-02-15 ENCOUNTER — Other Ambulatory Visit: Payer: Self-pay

## 2018-02-15 ENCOUNTER — Emergency Department (HOSPITAL_COMMUNITY): Payer: Medicare Other

## 2018-02-15 ENCOUNTER — Emergency Department (HOSPITAL_COMMUNITY)
Admission: EM | Admit: 2018-02-15 | Discharge: 2018-02-15 | Disposition: A | Discharge status: Home / Self Care | Payer: Medicare Other | Attending: Emergency Medicine | Admitting: Emergency Medicine

## 2018-02-15 DIAGNOSIS — J069 Acute upper respiratory infection, unspecified: Secondary | ICD-10-CM | POA: Insufficient documentation

## 2018-02-15 DIAGNOSIS — F17210 Nicotine dependence, cigarettes, uncomplicated: Secondary | ICD-10-CM | POA: Insufficient documentation

## 2018-02-15 DIAGNOSIS — D3500 Benign neoplasm of unspecified adrenal gland: Secondary | ICD-10-CM | POA: Diagnosis not present

## 2018-02-15 DIAGNOSIS — K5730 Diverticulosis of large intestine without perforation or abscess without bleeding: Secondary | ICD-10-CM | POA: Diagnosis not present

## 2018-02-15 DIAGNOSIS — J9811 Atelectasis: Secondary | ICD-10-CM | POA: Diagnosis not present

## 2018-02-15 DIAGNOSIS — I10 Essential (primary) hypertension: Secondary | ICD-10-CM | POA: Diagnosis not present

## 2018-02-15 DIAGNOSIS — Z79899 Other long term (current) drug therapy: Secondary | ICD-10-CM | POA: Diagnosis not present

## 2018-02-15 DIAGNOSIS — R1012 Left upper quadrant pain: Secondary | ICD-10-CM

## 2018-02-15 DIAGNOSIS — K625 Hemorrhage of anus and rectum: Secondary | ICD-10-CM | POA: Diagnosis not present

## 2018-02-15 LAB — ETHANOL: Alcohol, Ethyl (B): <10 mg/dL (ref <10)

## 2018-02-15 LAB — URINALYSIS, ROUTINE W REFLEX MICROSCOPIC
Bilirubin Urine: NEGATIVE
Glucose, UA: NEGATIVE mg/dL
Hgb urine dipstick: NEGATIVE
Ketones, ur: 5 mg/dL — AB
Leukocytes, UA: NEGATIVE
Nitrite: NEGATIVE
Protein, ur: NEGATIVE mg/dL
Specific Gravity, Urine: 1.004 — ABNORMAL LOW (ref 1.005–1.030)
pH: 6 (ref 5.0–8.0)

## 2018-02-15 LAB — TYPE AND SCREEN
ABO/RH(D): O NEG
Antibody Screen: NEGATIVE

## 2018-02-15 LAB — COMPREHENSIVE METABOLIC PANEL
ALT: 20 U/L (ref 17–63)
AST: 35 U/L (ref 15–41)
Albumin: 3.7 g/dL (ref 3.5–5.0)
Alkaline Phosphatase: 78 U/L (ref 38–126)
Anion gap: 11 (ref 5–15)
BUN: 7 mg/dL (ref 6–20)
CO2: 26 mmol/L (ref 22–32)
Calcium: 9.6 mg/dL (ref 8.9–10.3)
Chloride: 100 mmol/L — ABNORMAL LOW (ref 101–111)
Creatinine, Ser: 1.08 mg/dL (ref 0.61–1.24)
GFR calc Af Amer: >60 mL/min (ref >60)
GFR calc non Af Amer: >60 mL/min (ref >60)
Glucose, Bld: 103 mg/dL — ABNORMAL HIGH (ref 65–99)
Potassium: 4.1 mmol/L (ref 3.5–5.1)
Sodium: 137 mmol/L (ref 135–145)
Total Bilirubin: 1.1 mg/dL (ref 0.3–1.2)
Total Protein: 7.1 g/dL (ref 6.5–8.1)

## 2018-02-15 LAB — CBC
HCT: 40 % (ref 39.0–52.0)
Hemoglobin: 13.4 g/dL (ref 13.0–17.0)
MCH: 28 pg (ref 26.0–34.0)
MCHC: 33.5 g/dL (ref 30.0–36.0)
MCV: 83.5 fL (ref 78.0–100.0)
Platelets: 332 10*3/uL (ref 150–400)
RBC: 4.79 MIL/uL (ref 4.22–5.81)
RDW: 14.3 % (ref 11.5–15.5)
WBC: 10.2 10*3/uL (ref 4.0–10.5)

## 2018-02-15 LAB — POC OCCULT BLOOD, ED: Fecal Occult Bld: POSITIVE — AB

## 2018-02-15 LAB — LIPASE, BLOOD: Lipase: 26 U/L (ref 11–51)

## 2018-02-15 MED ORDER — SODIUM CHLORIDE 0.9 % IV BOLUS (SEPSIS)
1000.0000 mL | Freq: Once | INTRAVENOUS | Status: AC
  Administered 2018-02-15: 1000 mL via INTRAVENOUS

## 2018-02-15 MED ORDER — HYDROCODONE-ACETAMINOPHEN 5-325 MG PO TABS: 1.0000 | ORAL_TABLET | ORAL | 0 refills | Status: DC | PRN

## 2018-02-15 MED ORDER — IOPAMIDOL (ISOVUE-300) INJECTION 61%
INTRAVENOUS | Status: AC
  Administered 2018-02-15: 100 mL
  Filled 2018-02-15: qty 100

## 2018-02-15 MED ORDER — FENTANYL CITRATE (PF) 100 MCG/2ML IJ SOLN
50.0000 ug | Freq: Once | INTRAMUSCULAR | Status: AC
  Administered 2018-02-15: 50 ug via INTRAVENOUS
  Filled 2018-02-15: qty 2

## 2018-02-15 MED ORDER — OMEPRAZOLE 20 MG PO CPDR: 20.0000 mg | DELAYED_RELEASE_CAPSULE | Freq: Every day | ORAL | 0 refills | Status: DC

## 2018-02-15 MED ORDER — ONDANSETRON HCL 4 MG/2ML IJ SOLN
4.0000 mg | Freq: Once | INTRAMUSCULAR | Status: AC
  Administered 2018-02-15: 4 mg via INTRAVENOUS
  Filled 2018-02-15: qty 2

## 2018-02-15 NOTE — ED Notes (Signed)
Patient transported to CT 

## 2018-02-15 NOTE — Discharge Instructions (Signed)
You have a nodule on your left adrenal gland that will need an MRI to better characterize.  This can be arranged by your primary doctor.  You should refrain from drinking alcohol.  Follow-up with the gastroenterologist at [facility] gastroenterology that he has seen in the past.  Return to the emergency department if you have any worsening symptoms including worsening rectal bleeding.

## 2018-02-15 NOTE — ED Triage Notes (Signed)
Per EMS pt complaint of left abdominal pain with bloating and black stool for 2 days.
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
## 2018-03-05 ENCOUNTER — Ambulatory Visit: Payer: Self-pay | Admitting: Nurse Practitioner

## 2018-03-05 DIAGNOSIS — M79661 Pain in right lower leg: Secondary | ICD-10-CM | POA: Diagnosis not present

## 2018-03-05 DIAGNOSIS — G8929 Other chronic pain: Secondary | ICD-10-CM | POA: Diagnosis not present

## 2018-03-05 DIAGNOSIS — M79662 Pain in left lower leg: Secondary | ICD-10-CM | POA: Diagnosis not present

## 2018-03-05 DIAGNOSIS — M79605 Pain in left leg: Secondary | ICD-10-CM | POA: Diagnosis not present

## 2018-03-05 DIAGNOSIS — M545 Low back pain: Secondary | ICD-10-CM | POA: Diagnosis not present

## 2018-03-05 DIAGNOSIS — M79604 Pain in right leg: Secondary | ICD-10-CM | POA: Diagnosis not present

## 2018-03-14 ENCOUNTER — Ambulatory Visit: Payer: Self-pay | Admitting: Nurse Practitioner

## 2018-03-27 ENCOUNTER — Other Ambulatory Visit: Payer: Self-pay

## 2018-03-27 ENCOUNTER — Emergency Department (HOSPITAL_COMMUNITY)
Admission: EM | Admit: 2018-03-27 | Discharge: 2018-03-28 | Disposition: A | Discharge status: Home / Self Care | Payer: Medicare Other | Attending: Emergency Medicine | Admitting: Emergency Medicine

## 2018-03-27 ENCOUNTER — Encounter (HOSPITAL_COMMUNITY): Payer: Self-pay | Admitting: Emergency Medicine

## 2018-03-27 DIAGNOSIS — Z79899 Other long term (current) drug therapy: Secondary | ICD-10-CM | POA: Insufficient documentation

## 2018-03-27 DIAGNOSIS — R1030 Lower abdominal pain, unspecified: Secondary | ICD-10-CM | POA: Diagnosis not present

## 2018-03-27 DIAGNOSIS — F17210 Nicotine dependence, cigarettes, uncomplicated: Secondary | ICD-10-CM | POA: Diagnosis not present

## 2018-03-27 DIAGNOSIS — Z8673 Personal history of transient ischemic attack (TIA), and cerebral infarction without residual deficits: Secondary | ICD-10-CM | POA: Diagnosis not present

## 2018-03-27 DIAGNOSIS — R197 Diarrhea, unspecified: Secondary | ICD-10-CM | POA: Diagnosis not present

## 2018-03-27 DIAGNOSIS — K625 Hemorrhage of anus and rectum: Secondary | ICD-10-CM

## 2018-03-27 DIAGNOSIS — R112 Nausea with vomiting, unspecified: Secondary | ICD-10-CM | POA: Insufficient documentation

## 2018-03-27 DIAGNOSIS — I10 Essential (primary) hypertension: Secondary | ICD-10-CM | POA: Insufficient documentation

## 2018-03-27 DIAGNOSIS — R109 Unspecified abdominal pain: Secondary | ICD-10-CM | POA: Diagnosis not present

## 2018-03-27 NOTE — ED Triage Notes (Signed)
Pt arriving via EMS for rectal bleed. Pt reports vomiting blood earlier today and having bright red blood in his stools with clots. Pt having sharp pains throughout his abdomen. Pt was seen for same in February.

## 2018-03-28 DIAGNOSIS — K625 Hemorrhage of anus and rectum: Secondary | ICD-10-CM | POA: Diagnosis not present

## 2018-03-28 LAB — BASIC METABOLIC PANEL
Anion gap: 13 (ref 5–15)
BUN: 22 mg/dL — ABNORMAL HIGH (ref 6–20)
CO2: 25 mmol/L (ref 22–32)
Calcium: 10.2 mg/dL (ref 8.9–10.3)
Chloride: 92 mmol/L — ABNORMAL LOW (ref 101–111)
Creatinine, Ser: 1.05 mg/dL (ref 0.61–1.24)
GFR calc Af Amer: >60 mL/min (ref >60)
GFR calc non Af Amer: >60 mL/min (ref >60)
Glucose, Bld: 113 mg/dL — ABNORMAL HIGH (ref 65–99)
Potassium: 4.5 mmol/L (ref 3.5–5.1)
Sodium: 130 mmol/L — ABNORMAL LOW (ref 135–145)

## 2018-03-28 LAB — CBC WITH DIFFERENTIAL/PLATELET
Basophils Absolute: 0.1 10*3/uL (ref 0.0–0.1)
Basophils Relative: 0 %
Eosinophils Absolute: 0.1 10*3/uL (ref 0.0–0.7)
Eosinophils Relative: 1 %
HCT: 44.7 % (ref 39.0–52.0)
Hemoglobin: 14.9 g/dL (ref 13.0–17.0)
Lymphocytes Relative: 34 %
Lymphs Abs: 5.5 10*3/uL — ABNORMAL HIGH (ref 0.7–4.0)
MCH: 27.6 pg (ref 26.0–34.0)
MCHC: 33.3 g/dL (ref 30.0–36.0)
MCV: 82.8 fL (ref 78.0–100.0)
Monocytes Absolute: 1.1 10*3/uL — ABNORMAL HIGH (ref 0.1–1.0)
Monocytes Relative: 7 %
Neutro Abs: 9.2 10*3/uL — ABNORMAL HIGH (ref 1.7–7.7)
Neutrophils Relative %: 58 %
Platelets: 446 10*3/uL — ABNORMAL HIGH (ref 150–400)
RBC: 5.4 MIL/uL (ref 4.22–5.81)
RDW: 14.2 % (ref 11.5–15.5)
WBC: 16 10*3/uL — ABNORMAL HIGH (ref 4.0–10.5)

## 2018-03-28 LAB — TYPE AND SCREEN
ABO/RH(D): O NEG
Antibody Screen: NEGATIVE

## 2018-03-28 LAB — POC OCCULT BLOOD, ED: Fecal Occult Bld: POSITIVE — AB

## 2018-03-28 MED ORDER — ONDANSETRON HCL 4 MG/2ML IJ SOLN
4.0000 mg | Freq: Once | INTRAMUSCULAR | Status: AC
  Administered 2018-03-28: 4 mg via INTRAVENOUS
  Filled 2018-03-28: qty 2

## 2018-03-28 MED ORDER — ONDANSETRON 4 MG PO TBDP: 4.0000 mg | ORAL_TABLET | Freq: Three times a day (TID) | ORAL | 0 refills | Status: DC | PRN

## 2018-03-28 MED ORDER — SODIUM CHLORIDE 0.9 % IV BOLUS
1000.0000 mL | Freq: Once | INTRAVENOUS | Status: AC
Start: 2018-03-28 — End: 2018-03-28
  Administered 2018-03-28: 1000 mL via INTRAVENOUS

## 2018-03-28 MED ORDER — PANTOPRAZOLE SODIUM 40 MG IV SOLR
40.0000 mg | Freq: Once | INTRAVENOUS | Status: AC
  Administered 2018-03-28: 40 mg via INTRAVENOUS
  Filled 2018-03-28: qty 40

## 2018-03-28 MED ORDER — MORPHINE SULFATE (PF) 4 MG/ML IV SOLN
4.0000 mg | Freq: Once | INTRAVENOUS | Status: AC
  Administered 2018-03-28: 4 mg via INTRAVENOUS
  Filled 2018-03-28: qty 1
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
## 2018-03-29 ENCOUNTER — Other Ambulatory Visit: Payer: Self-pay

## 2018-03-29 NOTE — Patient Outreach (Signed)
[facility] [place]) Care Management  03/29/2018  Jacob [name] Feb 17, 1966 [id number]   Telephone Screen Referral Date : 03/29/2018 Referral Source: [facility] ED Census Referral Reason: ED Utilization Insurance:Medicare   Outreach attempt # 1 To patient.  Telephone call to the patient. No answer. HIPAA compliant voicemail left with contact information  Plan: [place] will make an outreach attempt to the patient within three to four days.  Lazaro Arms RN, BSN, [place] Direct Dial:  [phone] Fax: [phone]

## 2018-04-04 ENCOUNTER — Other Ambulatory Visit: Payer: Self-pay

## 2018-04-04 ENCOUNTER — Other Ambulatory Visit (INDEPENDENT_AMBULATORY_CARE_PROVIDER_SITE_OTHER): Payer: Self-pay | Admitting: Orthopedic Surgery

## 2018-04-04 NOTE — Patient Outreach (Signed)
[facility]) Care Management  04/04/2018  Jacob [name] [birth year] [id number]  Telephone call to the patient for Screening.  No answer.  HIPAA compliant voicemail left with contact information.  Plan: RN Health Coach will send letter. RN Health Coach will make another attempt to the patient in three to four business days.   Lazaro Arms RN, BSN, [place] Direct Dial:  [phone]  Fax: [phone]

## 2018-04-04 NOTE — Patient Outreach (Signed)
[facility]) Care Management  04/04/2018  Jacob [name] [birth year] [id number]   Telephone Screen Referral Date : 03/29/2018  Referral Source: [facility] ED Census Referral Reason: ED Utilization Insurance: Medicare  Outreach attempt # 2 To patient.   Telephone call placed to th patient for ED Utilization.  HIPAA verified.  The patient states that he is doing better.  He has had some nausea last night  but the prescription given to him at the hospital has helped.  The patient states that he does not have a Primary care physician.  RN Health Coach offered the number to call and help find one.  The patient states that he sees mental health and they have given him numbers of physician to contact.  The patient states he does not have any other needs at this time.  Plan: RN Health Coach will close the case at this time due to patient has been assessed and no other interventions needed at this time.  Lazaro Arms RN, BSN, [place] Direct Dial:  [phone] Fax: [phone]

## 2018-04-11 ENCOUNTER — Ambulatory Visit: Payer: Self-pay

## 2018-04-12 DIAGNOSIS — Z1159 Encounter for screening for other viral diseases: Secondary | ICD-10-CM | POA: Diagnosis not present

## 2018-04-12 DIAGNOSIS — F319 Bipolar disorder, unspecified: Secondary | ICD-10-CM | POA: Diagnosis not present

## 2018-04-12 DIAGNOSIS — Z131 Encounter for screening for diabetes mellitus: Secondary | ICD-10-CM | POA: Diagnosis not present

## 2018-04-12 DIAGNOSIS — I10 Essential (primary) hypertension: Secondary | ICD-10-CM | POA: Diagnosis not present

## 2018-04-12 DIAGNOSIS — J302 Other seasonal allergic rhinitis: Secondary | ICD-10-CM | POA: Diagnosis not present

## 2018-04-12 DIAGNOSIS — H538 Other visual disturbances: Secondary | ICD-10-CM | POA: Diagnosis not present

## 2018-04-12 DIAGNOSIS — Z720 Tobacco use: Secondary | ICD-10-CM | POA: Diagnosis not present

## 2018-04-12 DIAGNOSIS — Z0110 Encounter for examination of ears and hearing without abnormal findings: Secondary | ICD-10-CM | POA: Diagnosis not present

## 2018-04-12 DIAGNOSIS — G8929 Other chronic pain: Secondary | ICD-10-CM | POA: Diagnosis not present

## 2018-04-12 DIAGNOSIS — Z136 Encounter for screening for cardiovascular disorders: Secondary | ICD-10-CM | POA: Diagnosis not present

## 2018-04-12 DIAGNOSIS — E669 Obesity, unspecified: Secondary | ICD-10-CM | POA: Diagnosis not present

## 2018-04-12 DIAGNOSIS — K219 Gastro-esophageal reflux disease without esophagitis: Secondary | ICD-10-CM | POA: Diagnosis not present

## 2018-04-12 DIAGNOSIS — Z125 Encounter for screening for malignant neoplasm of prostate: Secondary | ICD-10-CM | POA: Diagnosis not present

## 2018-04-12 DIAGNOSIS — Z5181 Encounter for therapeutic drug level monitoring: Secondary | ICD-10-CM | POA: Diagnosis not present

## 2018-04-18 ENCOUNTER — Emergency Department (HOSPITAL_COMMUNITY)
Admission: EM | Admit: 2018-04-18 | Discharge: 2018-04-18 | Disposition: A | Discharge status: Home / Self Care | Payer: Medicare Other | Attending: Emergency Medicine | Admitting: Emergency Medicine

## 2018-04-18 ENCOUNTER — Other Ambulatory Visit: Payer: Self-pay

## 2018-04-18 ENCOUNTER — Encounter (HOSPITAL_COMMUNITY): Payer: Self-pay | Admitting: Emergency Medicine

## 2018-04-18 DIAGNOSIS — Z202 Contact with and (suspected) exposure to infections with a predominantly sexual mode of transmission: Secondary | ICD-10-CM

## 2018-04-18 DIAGNOSIS — Z79899 Other long term (current) drug therapy: Secondary | ICD-10-CM | POA: Insufficient documentation

## 2018-04-18 DIAGNOSIS — R109 Unspecified abdominal pain: Secondary | ICD-10-CM | POA: Insufficient documentation

## 2018-04-18 DIAGNOSIS — F17210 Nicotine dependence, cigarettes, uncomplicated: Secondary | ICD-10-CM | POA: Diagnosis not present

## 2018-04-18 DIAGNOSIS — R110 Nausea: Secondary | ICD-10-CM | POA: Diagnosis not present

## 2018-04-18 DIAGNOSIS — R195 Other fecal abnormalities: Secondary | ICD-10-CM | POA: Diagnosis not present

## 2018-04-18 DIAGNOSIS — Z113 Encounter for screening for infections with a predominantly sexual mode of transmission: Secondary | ICD-10-CM | POA: Diagnosis not present

## 2018-04-18 DIAGNOSIS — I10 Essential (primary) hypertension: Secondary | ICD-10-CM | POA: Insufficient documentation

## 2018-04-18 DIAGNOSIS — Z8673 Personal history of transient ischemic attack (TIA), and cerebral infarction without residual deficits: Secondary | ICD-10-CM | POA: Diagnosis not present

## 2018-04-18 DIAGNOSIS — K921 Melena: Secondary | ICD-10-CM | POA: Diagnosis not present

## 2018-04-18 LAB — CBC
HCT: 39.5 % (ref 39.0–52.0)
Hemoglobin: 13.2 g/dL (ref 13.0–17.0)
MCH: 27.2 pg (ref 26.0–34.0)
MCHC: 33.4 g/dL (ref 30.0–36.0)
MCV: 81.4 fL (ref 78.0–100.0)
Platelets: 325 10*3/uL (ref 150–400)
RBC: 4.85 MIL/uL (ref 4.22–5.81)
RDW: 14.2 % (ref 11.5–15.5)
WBC: 10.6 10*3/uL — ABNORMAL HIGH (ref 4.0–10.5)

## 2018-04-18 LAB — COMPREHENSIVE METABOLIC PANEL
ALT: 42 U/L (ref 17–63)
AST: 28 U/L (ref 15–41)
Albumin: 4 g/dL (ref 3.5–5.0)
Alkaline Phosphatase: 74 U/L (ref 38–126)
Anion gap: 8 (ref 5–15)
BUN: 16 mg/dL (ref 6–20)
CO2: 19 mmol/L — ABNORMAL LOW (ref 22–32)
Calcium: 9.3 mg/dL (ref 8.9–10.3)
Chloride: 106 mmol/L (ref 101–111)
Creatinine, Ser: 0.86 mg/dL (ref 0.61–1.24)
GFR calc Af Amer: >60 mL/min (ref >60)
GFR calc non Af Amer: >60 mL/min (ref >60)
Glucose, Bld: 106 mg/dL — ABNORMAL HIGH (ref 65–99)
Potassium: 4.4 mmol/L (ref 3.5–5.1)
Sodium: 133 mmol/L — ABNORMAL LOW (ref 135–145)
Total Bilirubin: 0.8 mg/dL (ref 0.3–1.2)
Total Protein: 8.1 g/dL (ref 6.5–8.1)

## 2018-04-18 LAB — URINALYSIS, ROUTINE W REFLEX MICROSCOPIC
Bilirubin Urine: NEGATIVE
Glucose, UA: NEGATIVE mg/dL
Ketones, ur: NEGATIVE mg/dL
Leukocytes, UA: NEGATIVE
Nitrite: NEGATIVE
Protein, ur: NEGATIVE mg/dL
Specific Gravity, Urine: 1.004 — ABNORMAL LOW (ref 1.005–1.030)
pH: 5 (ref 5.0–8.0)

## 2018-04-18 LAB — CBC WITH DIFFERENTIAL/PLATELET
Basophils Absolute: 0 10*3/uL (ref 0.0–0.1)
Basophils Relative: 0 %
Eosinophils Absolute: 0.1 10*3/uL (ref 0.0–0.7)
Eosinophils Relative: 1 %
HCT: 38.6 % — ABNORMAL LOW (ref 39.0–52.0)
Hemoglobin: 13.2 g/dL (ref 13.0–17.0)
Lymphocytes Relative: 27 %
Lymphs Abs: 2.8 10*3/uL (ref 0.7–4.0)
MCH: 27.6 pg (ref 26.0–34.0)
MCHC: 34.2 g/dL (ref 30.0–36.0)
MCV: 80.6 fL (ref 78.0–100.0)
Monocytes Absolute: 0.7 10*3/uL (ref 0.1–1.0)
Monocytes Relative: 6 %
Neutro Abs: 6.9 10*3/uL (ref 1.7–7.7)
Neutrophils Relative %: 66 %
Platelets: 323 10*3/uL (ref 150–400)
RBC: 4.79 MIL/uL (ref 4.22–5.81)
RDW: 14.2 % (ref 11.5–15.5)
WBC: 10.6 10*3/uL — ABNORMAL HIGH (ref 4.0–10.5)

## 2018-04-18 LAB — RAPID HIV SCREEN (HIV 1/2 AB+AG)
HIV 1/2 Antibodies: NONREACTIVE
HIV-1 P24 Antigen - HIV24: NONREACTIVE

## 2018-04-18 LAB — LIPASE, BLOOD: Lipase: 31 U/L (ref 11–51)

## 2018-04-18 MED ORDER — CEFTRIAXONE SODIUM 250 MG IJ SOLR
250.0000 mg | Freq: Once | INTRAMUSCULAR | Status: AC
  Administered 2018-04-18: 250 mg via INTRAMUSCULAR
  Filled 2018-04-18: qty 250

## 2018-04-18 MED ORDER — AZITHROMYCIN 1 G PO PACK
1.0000 g | PACK | Freq: Once | ORAL | Status: AC
  Administered 2018-04-18: 1 g via ORAL
  Filled 2018-04-18: qty 1

## 2018-04-18 MED ORDER — IBUPROFEN 200 MG PO TABS
400.0000 mg | ORAL_TABLET | Freq: Once | ORAL | Status: AC | PRN
  Administered 2018-04-18: 400 mg via ORAL
  Filled 2018-04-18: qty 2

## 2018-04-18 MED ORDER — SODIUM CHLORIDE 0.9 % IV BOLUS
1000.0000 mL | Freq: Once | INTRAVENOUS | Status: AC
  Administered 2018-04-18: 1000 mL via INTRAVENOUS

## 2018-04-18 MED ORDER — MORPHINE SULFATE (PF) 4 MG/ML IV SOLN
4.0000 mg | Freq: Once | INTRAVENOUS | Status: AC
  Administered 2018-04-18: 4 mg via INTRAVENOUS
  Filled 2018-04-18: qty 1

## 2018-04-18 MED ORDER — METHOCARBAMOL 500 MG PO TABS: 500.0000 mg | ORAL_TABLET | Freq: Three times a day (TID) | ORAL | 0 refills | Status: DC | PRN

## 2018-04-18 NOTE — ED Triage Notes (Signed)
Pt brought in by EMS from home with c/o abd pain with N/V/D  Pt states the pain is upper abd/under his left rib area and it is worse when he coughs, sneezes, or blows his nose  Pt also states he has had diarrhea 5 times today and it has had some blood in it  Pt states he was recently started on zyrtec for his allergies

## 2018-04-18 NOTE — ED Notes (Signed)
Called in waiting room no answer. 

## 2018-04-18 NOTE — ED Notes (Addendum)
Pt is stating at this time of a request for bp check by pt that "room is spinning", "pain is off the charts", " pain is all over". Bp @ this time is 175/100. When pt was called in lobby for the bp check he requested, he was found outside smoking. Triage RN was notified of vitals and statements made by pt.
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
## 2018-04-18 NOTE — Discharge Instructions (Signed)
Take tylenol for pain.   Take robaxin for muscle spasms.   See your pain doctor.   Get colonoscopy as scheduled with GI   Use condoms.   Stay hydrated .  Return to ER if you have worse abdominal pain, vomiting, fever.
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
## 2018-04-19 LAB — RPR: RPR Ser Ql: NONREACTIVE

## 2018-04-19 LAB — HIV ANTIBODY (ROUTINE TESTING W REFLEX): HIV Screen 4th Generation wRfx: NONREACTIVE

## 2018-04-21 ENCOUNTER — Encounter (HOSPITAL_COMMUNITY): Payer: Self-pay | Admitting: Emergency Medicine

## 2018-04-21 ENCOUNTER — Other Ambulatory Visit: Payer: Self-pay

## 2018-04-21 ENCOUNTER — Emergency Department (HOSPITAL_COMMUNITY)
Admission: EM | Admit: 2018-04-21 | Discharge: 2018-04-21 | Disposition: A | Discharge status: Home / Self Care | Payer: Medicare Other | Attending: Emergency Medicine | Admitting: Emergency Medicine

## 2018-04-21 DIAGNOSIS — R404 Transient alteration of awareness: Secondary | ICD-10-CM | POA: Diagnosis not present

## 2018-04-21 DIAGNOSIS — R197 Diarrhea, unspecified: Secondary | ICD-10-CM | POA: Diagnosis not present

## 2018-04-21 DIAGNOSIS — Z8673 Personal history of transient ischemic attack (TIA), and cerebral infarction without residual deficits: Secondary | ICD-10-CM | POA: Diagnosis not present

## 2018-04-21 DIAGNOSIS — F17210 Nicotine dependence, cigarettes, uncomplicated: Secondary | ICD-10-CM | POA: Insufficient documentation

## 2018-04-21 DIAGNOSIS — R531 Weakness: Secondary | ICD-10-CM | POA: Diagnosis not present

## 2018-04-21 DIAGNOSIS — Z79899 Other long term (current) drug therapy: Secondary | ICD-10-CM | POA: Diagnosis not present

## 2018-04-21 DIAGNOSIS — R1013 Epigastric pain: Secondary | ICD-10-CM

## 2018-04-21 DIAGNOSIS — K2900 Acute gastritis without bleeding: Secondary | ICD-10-CM | POA: Diagnosis not present

## 2018-04-21 DIAGNOSIS — I10 Essential (primary) hypertension: Secondary | ICD-10-CM | POA: Diagnosis not present

## 2018-04-21 LAB — URINALYSIS, ROUTINE W REFLEX MICROSCOPIC
Bacteria, UA: NONE SEEN
Bilirubin Urine: NEGATIVE
Glucose, UA: NEGATIVE mg/dL
Ketones, ur: NEGATIVE mg/dL
Leukocytes, UA: NEGATIVE
Nitrite: NEGATIVE
Protein, ur: NEGATIVE mg/dL
Specific Gravity, Urine: 1.005 (ref 1.005–1.030)
pH: 6 (ref 5.0–8.0)

## 2018-04-21 LAB — CBC
HCT: 37.9 % — ABNORMAL LOW (ref 39.0–52.0)
Hemoglobin: 13.2 g/dL (ref 13.0–17.0)
MCH: 27.7 pg (ref 26.0–34.0)
MCHC: 34.8 g/dL (ref 30.0–36.0)
MCV: 79.5 fL (ref 78.0–100.0)
Platelets: 324 10*3/uL (ref 150–400)
RBC: 4.77 MIL/uL (ref 4.22–5.81)
RDW: 13.6 % (ref 11.5–15.5)
WBC: 8.8 10*3/uL (ref 4.0–10.5)

## 2018-04-21 LAB — COMPREHENSIVE METABOLIC PANEL
ALT: 30 U/L (ref 17–63)
AST: 26 U/L (ref 15–41)
Albumin: 3.7 g/dL (ref 3.5–5.0)
Alkaline Phosphatase: 64 U/L (ref 38–126)
Anion gap: 11 (ref 5–15)
BUN: 12 mg/dL (ref 6–20)
CO2: 21 mmol/L — ABNORMAL LOW (ref 22–32)
Calcium: 8.9 mg/dL (ref 8.9–10.3)
Chloride: 101 mmol/L (ref 101–111)
Creatinine, Ser: 0.85 mg/dL (ref 0.61–1.24)
GFR calc Af Amer: >60 mL/min (ref >60)
GFR calc non Af Amer: >60 mL/min (ref >60)
Glucose, Bld: 96 mg/dL (ref 65–99)
Potassium: 3.9 mmol/L (ref 3.5–5.1)
Sodium: 133 mmol/L — ABNORMAL LOW (ref 135–145)
Total Bilirubin: 0.4 mg/dL (ref 0.3–1.2)
Total Protein: 7 g/dL (ref 6.5–8.1)

## 2018-04-21 LAB — LIPASE, BLOOD: Lipase: 25 U/L (ref 11–51)

## 2018-04-21 MED ORDER — LOPERAMIDE HCL 2 MG PO CAPS: 2.0000 mg | ORAL_CAPSULE | Freq: Four times a day (QID) | ORAL | 0 refills | Status: DC | PRN

## 2018-04-21 MED ORDER — RISPERIDONE 2 MG PO TABS
2.0000 mg | ORAL_TABLET | Freq: Once | ORAL | Status: AC
  Administered 2018-04-21: 2 mg via ORAL
  Filled 2018-04-21: qty 1

## 2018-04-21 MED ORDER — DICYCLOMINE HCL 20 MG PO TABS: 20.0000 mg | ORAL_TABLET | Freq: Four times a day (QID) | ORAL | 0 refills | Status: DC

## 2018-04-21 MED ORDER — DICYCLOMINE HCL 10 MG/ML IM SOLN
20.0000 mg | Freq: Once | INTRAMUSCULAR | Status: AC
Start: 2018-04-21 — End: 2018-04-21
  Administered 2018-04-21: 20 mg via INTRAMUSCULAR
  Filled 2018-04-21: qty 2

## 2018-04-21 MED ORDER — SODIUM CHLORIDE 0.9 % IV BOLUS
1000.0000 mL | Freq: Once | INTRAVENOUS | Status: AC
  Administered 2018-04-21: 1000 mL via INTRAVENOUS

## 2018-04-21 MED ORDER — RISPERIDONE 2 MG PO TABS: 2.0000 mg | ORAL_TABLET | Freq: Two times a day (BID) | ORAL | 0 refills | Status: DC

## 2018-04-21 NOTE — ED Triage Notes (Addendum)
Patient arrives by [facility] with complaints of diarrhea for the past 9 days-patient states he has been out of his Risperdal for the past 3 days. Patient states he has been very anxious since off Risperdal. Patient states Dr. Alyson Ingles was his primary and he no longer has a primary care physician. Patient states he was given a primary at [facility] and patient states he has no way to get there.

## 2018-04-21 NOTE — ED Notes (Signed)
Bed: VI71 Expected date:  Expected time:  Means of arrival:  Comments: EMS diarrhea

## 2018-04-21 NOTE — ED Notes (Addendum)
Patient called out. Patient states his legs are twitching every 3 to 4 minutes and it wakes him up. No twitching seen on observation.
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
## 2018-04-22 LAB — GC/CHLAMYDIA PROBE AMP (~~LOC~~) NOT AT ARMC
Chlamydia: NEGATIVE
Neisseria Gonorrhea: NEGATIVE

## 2018-04-23 ENCOUNTER — Encounter: Payer: Self-pay | Admitting: Nurse Practitioner

## 2018-04-23 ENCOUNTER — Other Ambulatory Visit: Payer: Medicare Other

## 2018-04-23 ENCOUNTER — Ambulatory Visit (INDEPENDENT_AMBULATORY_CARE_PROVIDER_SITE_OTHER): Payer: Medicare Other | Admitting: Nurse Practitioner

## 2018-04-23 VITALS — BP 130/84 | HR 90 | Ht 73.0 in | Wt 222.6 lb

## 2018-04-23 DIAGNOSIS — R634 Abnormal weight loss: Secondary | ICD-10-CM

## 2018-04-23 DIAGNOSIS — R197 Diarrhea, unspecified: Secondary | ICD-10-CM
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
## 2018-04-23 NOTE — Patient Instructions (Addendum)
If you are age 51 or older, your body mass index should be between 23-30. Your Body mass index is 29.37 kg/m. If this is out of the aforementioned range listed, please consider follow up with your Primary Care Provider.  If you are age 4 or younger, your body mass index should be between 19-25. Your Body mass index is 29.37 kg/m. If this is out of the aformentioned range listed, please consider follow up with your Primary Care Provider.   You have been scheduled for a colonoscopy. Please follow written instructions given to you at your visit today.  Please pick up your prep supplies at the pharmacy within the next 1-3 days. If you use inhalers (even only as needed), please bring them with you on the day of your procedure. Your physician has requested that you go to www.startemmi.com and enter the access code given to you at your visit today. This web site gives a general overview about your procedure. However, you should still follow specific instructions given to you by our office regarding your preparation for the procedure.  We have sent the following medications to your pharmacy for you to pick up at your convenience: [facility] provider has requested that you go to the basement level for lab work before leaving today. Press "B" on the elevator. The lab is located at the first door on the left as you exit the elevator. C Diff  Thank you for choosing me and [facility].   Tye Savoy, NP

## 2018-04-24 ENCOUNTER — Other Ambulatory Visit: Payer: Medicare Other

## 2018-04-25 LAB — CLOSTRIDIUM DIFFICILE TOXIN B, QUALITATIVE, REAL-TIME PCR: Toxigenic C. Difficile by PCR: NOT DETECTED

## 2018-04-26 ENCOUNTER — Encounter: Payer: Self-pay | Admitting: Nurse Practitioner

## 2018-04-29 NOTE — Progress Notes (Signed)
Thank you for sending this case to me. I have reviewed the entire note, and the outlined plan seems appropriate.  Please be certain the C diff result is checked before the patient comes for colonoscopy.  Wilfrid Lund, MD

## 2018-04-30 NOTE — Progress Notes (Signed)
Understood, thanks.  - HD 

## 2018-04-30 NOTE — Progress Notes (Signed)
C diff is negative.

## 2018-05-02 DIAGNOSIS — E785 Hyperlipidemia, unspecified: Secondary | ICD-10-CM | POA: Diagnosis not present

## 2018-05-02 DIAGNOSIS — E669 Obesity, unspecified: Secondary | ICD-10-CM | POA: Diagnosis not present

## 2018-05-02 DIAGNOSIS — Z720 Tobacco use: Secondary | ICD-10-CM | POA: Diagnosis not present

## 2018-05-02 DIAGNOSIS — M79605 Pain in left leg: Secondary | ICD-10-CM | POA: Diagnosis not present

## 2018-05-02 DIAGNOSIS — J069 Acute upper respiratory infection, unspecified: Secondary | ICD-10-CM | POA: Insufficient documentation

## 2018-05-02 DIAGNOSIS — M79604 Pain in right leg: Secondary | ICD-10-CM | POA: Diagnosis not present

## 2018-05-02 DIAGNOSIS — I10 Essential (primary) hypertension: Secondary | ICD-10-CM | POA: Diagnosis not present

## 2018-05-02 DIAGNOSIS — B9789 Other viral agents as the cause of diseases classified elsewhere: Secondary | ICD-10-CM | POA: Diagnosis not present

## 2018-05-02 DIAGNOSIS — R05 Cough: Secondary | ICD-10-CM | POA: Diagnosis present

## 2018-05-02 DIAGNOSIS — E1165 Type 2 diabetes mellitus with hyperglycemia: Secondary | ICD-10-CM | POA: Diagnosis not present

## 2018-05-02 DIAGNOSIS — G8929 Other chronic pain: Secondary | ICD-10-CM | POA: Insufficient documentation

## 2018-05-02 DIAGNOSIS — F17210 Nicotine dependence, cigarettes, uncomplicated: Secondary | ICD-10-CM | POA: Insufficient documentation

## 2018-05-02 DIAGNOSIS — F319 Bipolar disorder, unspecified: Secondary | ICD-10-CM | POA: Insufficient documentation

## 2018-05-02 DIAGNOSIS — R0981 Nasal congestion: Secondary | ICD-10-CM | POA: Insufficient documentation

## 2018-05-03 ENCOUNTER — Emergency Department (HOSPITAL_COMMUNITY)
Admission: EM | Admit: 2018-05-03 | Discharge: 2018-05-03 | Disposition: A | Discharge status: Home / Self Care | Payer: Medicare Other | Attending: Emergency Medicine | Admitting: Emergency Medicine

## 2018-05-03 ENCOUNTER — Encounter (HOSPITAL_COMMUNITY): Payer: Self-pay

## 2018-05-03 ENCOUNTER — Other Ambulatory Visit: Payer: Self-pay

## 2018-05-03 ENCOUNTER — Emergency Department (HOSPITAL_COMMUNITY): Payer: Medicare Other

## 2018-05-03 DIAGNOSIS — G8929 Other chronic pain: Secondary | ICD-10-CM

## 2018-05-03 DIAGNOSIS — J069 Acute upper respiratory infection, unspecified: Secondary | ICD-10-CM

## 2018-05-03 MED ORDER — ALBUTEROL SULFATE HFA 108 (90 BASE) MCG/ACT IN AERS: 1.0000 | INHALATION_SPRAY | Freq: Four times a day (QID) | RESPIRATORY_TRACT | 0 refills | Status: DC | PRN

## 2018-05-03 MED ORDER — OXYCODONE-ACETAMINOPHEN 5-325 MG PO TABS
1.0000 | ORAL_TABLET | ORAL | Status: DC | PRN
  Administered 2018-05-03: 1 via ORAL
  Filled 2018-05-03: qty 1

## 2018-05-03 NOTE — ED Triage Notes (Signed)
Pt states that he's hurting all over and he has a URI Pt has been having a productive cough also
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
## 2018-05-06 ENCOUNTER — Other Ambulatory Visit: Payer: Self-pay | Admitting: *Deleted

## 2018-05-06 NOTE — Patient Outreach (Signed)
[name]) Care Management  05/06/2018  Jacob [name] 1966/11/26 [id number]   Telephone Screen  Referral Date:  5/13/219 Referral Source:  [facility] ED Census Reason for Referral:  6 or more ED visits in the past 6 months Insurance:  Medicare & Medicaid   Outreach Attempt:  Outreach attempt #1 to patient for telephone screening. No answer. RN Health Coach left HIPAA compliant voicemail message along with contact information.  Plan:  RN Health Coach will send unsuccessful outreach letter to patient.  RN Health Coach will make another outreach attempt to patient within 3-4 business days if no return call back from patient.   [place] [phone] [email or web address]

## 2018-05-07 ENCOUNTER — Other Ambulatory Visit: Payer: Self-pay | Admitting: *Deleted
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
## 2018-05-08 ENCOUNTER — Telehealth: Payer: Self-pay | Admitting: Gastroenterology

## 2018-05-08 ENCOUNTER — Encounter: Payer: Medicare Other | Admitting: Gastroenterology

## 2018-05-08 NOTE — Telephone Encounter (Signed)
Understood. One chance to reschedule.

## 2018-05-08 NOTE — Telephone Encounter (Signed)
Patient rescheduled to next Friday 5.24.19 @4pm . Instructions were reread to patient and patient was advised that care partner had to stay the whole time.

## 2018-05-09 ENCOUNTER — Other Ambulatory Visit: Payer: Self-pay

## 2018-05-09 MED ORDER — NA SULFATE-K SULFATE-MG SULF 17.5-3.13-1.6 GM/177ML PO SOLN: 1.0000 | Freq: Once | ORAL | 0 refills | Status: AC

## 2018-05-09 NOTE — Telephone Encounter (Signed)
Done

## 2018-05-14 ENCOUNTER — Emergency Department (HOSPITAL_BASED_OUTPATIENT_CLINIC_OR_DEPARTMENT_OTHER)
Admission: EM | Admit: 2018-05-14 | Discharge: 2018-05-14 | Disposition: A | Discharge status: Home / Self Care | Payer: Medicare Other | Source: Home / Self Care

## 2018-05-14 ENCOUNTER — Encounter (HOSPITAL_COMMUNITY): Payer: Self-pay

## 2018-05-14 ENCOUNTER — Other Ambulatory Visit: Payer: Self-pay

## 2018-05-14 ENCOUNTER — Emergency Department (HOSPITAL_COMMUNITY)
Admission: EM | Admit: 2018-05-14 | Discharge: 2018-05-14 | Disposition: A | Discharge status: Home / Self Care | Payer: Medicare Other | Attending: Emergency Medicine | Admitting: Emergency Medicine

## 2018-05-14 DIAGNOSIS — R609 Edema, unspecified: Secondary | ICD-10-CM | POA: Diagnosis not present

## 2018-05-14 DIAGNOSIS — M79604 Pain in right leg: Secondary | ICD-10-CM | POA: Insufficient documentation

## 2018-05-14 DIAGNOSIS — I10 Essential (primary) hypertension: Secondary | ICD-10-CM | POA: Diagnosis not present

## 2018-05-14 DIAGNOSIS — M79661 Pain in right lower leg: Secondary | ICD-10-CM | POA: Diagnosis not present

## 2018-05-14 DIAGNOSIS — F17210 Nicotine dependence, cigarettes, uncomplicated: Secondary | ICD-10-CM | POA: Insufficient documentation

## 2018-05-14 DIAGNOSIS — M79606 Pain in leg, unspecified: Secondary | ICD-10-CM | POA: Diagnosis not present

## 2018-05-14 DIAGNOSIS — Z79899 Other long term (current) drug therapy: Secondary | ICD-10-CM | POA: Diagnosis not present

## 2018-05-14 MED ORDER — KETOROLAC TROMETHAMINE 30 MG/ML IJ SOLN
30.0000 mg | Freq: Once | INTRAMUSCULAR | Status: AC
Start: 2018-05-14 — End: 2018-05-14
  Administered 2018-05-14: 30 mg via INTRAMUSCULAR
  Filled 2018-05-14: qty 1
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
## 2018-05-14 NOTE — Progress Notes (Signed)
Right lower extremity venous duplex has been completed. Negative for DVT. Results were given to [facility] PA.  05/14/18 6:15 PM Carlos Levering RVT

## 2018-05-14 NOTE — Discharge Instructions (Addendum)
Please read attached information. If you experience any new or worsening signs or symptoms please return to the emergency room for evaluation. Please follow-up with your primary care provider or specialist as discussed.  °
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
## 2018-05-16 ENCOUNTER — Telehealth: Payer: Self-pay | Admitting: Gastroenterology

## 2018-05-16 NOTE — Telephone Encounter (Signed)
I returned patients call. Per Dr. Loletha Carrow, it is ok to proceed with scheduled colonoscopy after eating a piece of chicken. Jacob [name] states that he ate the chicken at 1:00 am. Patient was instructed to not eat any more solid food. Patient states that he has a 5th grade education and he can barely read or write and that is why he keeps messing up his instructions. I reviewed in detail his instructions, including a list of clear liquids as well as the times that he should drink his preps. I also instructed the patient to stop all fluids three hours before his procedure which is 1:00 Pm.  Patient verbalized understanding of instructions that were given.   Riki Sheer, LPN ( admitting )

## 2018-05-17 ENCOUNTER — Encounter: Payer: Self-pay | Admitting: *Deleted

## 2018-05-17 ENCOUNTER — Encounter: Payer: Medicare Other | Admitting: Gastroenterology

## 2018-05-31 DIAGNOSIS — E1165 Type 2 diabetes mellitus with hyperglycemia: Secondary | ICD-10-CM | POA: Diagnosis not present

## 2018-05-31 DIAGNOSIS — E785 Hyperlipidemia, unspecified: Secondary | ICD-10-CM | POA: Diagnosis not present

## 2018-05-31 DIAGNOSIS — I10 Essential (primary) hypertension: Secondary | ICD-10-CM | POA: Diagnosis not present

## 2018-05-31 DIAGNOSIS — Z720 Tobacco use: Secondary | ICD-10-CM | POA: Diagnosis not present

## 2018-05-31 DIAGNOSIS — E669 Obesity, unspecified: Secondary | ICD-10-CM | POA: Diagnosis not present

## 2018-05-31 DIAGNOSIS — G894 Chronic pain syndrome: Secondary | ICD-10-CM | POA: Diagnosis not present

## 2018-06-10 ENCOUNTER — Other Ambulatory Visit: Payer: Self-pay

## 2018-06-10 ENCOUNTER — Encounter: Payer: Self-pay | Admitting: Gastroenterology

## 2018-06-10 ENCOUNTER — Ambulatory Visit (AMBULATORY_SURGERY_CENTER): Payer: Medicare Other | Admitting: Gastroenterology

## 2018-06-10 VITALS — BP 127/94 | HR 99 | Temp 100.2°F | Resp 17 | Ht 73.0 in | Wt 222.0 lb

## 2018-06-10 DIAGNOSIS — K219 Gastro-esophageal reflux disease without esophagitis: Secondary | ICD-10-CM | POA: Diagnosis not present

## 2018-06-10 DIAGNOSIS — R634 Abnormal weight loss: Secondary | ICD-10-CM

## 2018-06-10 DIAGNOSIS — K625 Hemorrhage of anus and rectum: Secondary | ICD-10-CM

## 2018-06-10 DIAGNOSIS — R197 Diarrhea, unspecified: Secondary | ICD-10-CM | POA: Diagnosis not present

## 2018-06-10 DIAGNOSIS — F0390 Unspecified dementia without behavioral disturbance: Secondary | ICD-10-CM | POA: Diagnosis not present

## 2018-06-10 DIAGNOSIS — F319 Bipolar disorder, unspecified: Secondary | ICD-10-CM | POA: Diagnosis not present

## 2018-06-10 DIAGNOSIS — I10 Essential (primary) hypertension: Secondary | ICD-10-CM | POA: Diagnosis not present

## 2018-06-10 HISTORY — PX: COLONOSCOPY: SHX174

## 2018-06-10 MED ORDER — SODIUM CHLORIDE 0.9 % IV SOLN: 500.0000 mL | Freq: Once | INTRAVENOUS | Status: DC

## 2018-06-10 NOTE — Progress Notes (Signed)
Report to PACU, RN, vss, BBS= Clear.  

## 2018-06-10 NOTE — Op Note (Signed)
[facility] Patient Name: Jacob [name] Procedure Date: 06/10/2018 10:35 AM MRN: [id number] Endoscopist: Mallie Mussel L. Loletha Carrow , MD Age: 52 Referring MD:  Date of Birth: 1966/05/18 Gender: Male Account #: [id number] Procedure:                Colonoscopy Indications:              Rectal bleeding Medicines:                Monitored Anesthesia Care Procedure:                Pre-Anesthesia Assessment:                           - Prior to the procedure, a History and Physical                            was performed, and patient medications and                            allergies were reviewed. The patient's tolerance of                            previous anesthesia was also reviewed. The risks                            and benefits of the procedure and the sedation                            options and risks were discussed with the patient.                            All questions were answered, and informed consent                            was obtained. Prior Anticoagulants: The patient has                            taken no previous anticoagulant or antiplatelet                            agents. ASA Grade Assessment: III - A patient with                            severe systemic disease. After reviewing the risks                            and benefits, the patient was deemed in                            satisfactory condition to undergo the procedure.                           After obtaining informed consent, the colonoscope  was passed under direct vision. Throughout the                            procedure, the patient's blood pressure, pulse, and                            oxygen saturations were monitored continuously. The                            Colonoscope was introduced through the anus and                            advanced to the the cecum, identified by the                            ileocecal valve. The majoroty of the cecum,                             including the appendiceal orifice, could not be                            visualized due to retained solid stool. The                            majority of the cecum and some of the proximal                            ascending colon could not be visualized. The                            colonoscopy was performed without difficulty. The                            patient tolerated the procedure well. The quality                            of the bowel preparation was poor. The ileocecal                            valve and the rectum were photographed. Scope In: 10:39:16 AM Scope Out: 10:54:40 AM Scope Withdrawal Time: 0 hours 8 minutes 49 seconds  Total Procedure Duration: 0 hours 15 minutes 24 seconds  Findings:                 The perianal and digital rectal examinations were                            normal.                           Stool was found in the entire colon, interfering                            with visualization. Lavage of the area was  performed, resulting in incomplete clearance with                            continued poor visualization.                           Internal hemorrhoids were found. The hemorrhoids                            were Grade I (internal hemorrhoids that do not                            prolapse).                           The exam was otherwise without abnormality on                            direct and retroflexion views (given the                            limitations of the prep). Complications:            No immediate complications. Estimated Blood Loss:     Estimated blood loss: none. Impression:               - Preparation of the colon was poor.                           - Stool in the entire examined colon.                           - Internal hemorrhoids. These appear to be the                            cause of intermittent rectal bleeding (given the                             limitations of the prep).                           - The examination was otherwise normal on direct                            and retroflexion views.                           - No specimens collected. Recommendation:           - Patient has a contact number available for                            emergencies. The signs and symptoms of potential                            delayed complications were discussed with the  patient. Return to normal activities tomorrow.                            Written discharge instructions were provided to the                            patient.                           - Resume previous diet.                           - Continue present medications.                           - Repeat colonoscopy in 6 months for screening                            purposes. Strict attention to pre-procedure dietary                            restrictions and 2 -day bowel preparation required.                           There have been multiple significant challenges to                            achieving a proper bowel preparation and performing                            colonoscopy on this patient. Henry L. Loletha Carrow, MD 06/10/2018 11:04:32 AM This report has been signed electronically.

## 2018-06-10 NOTE — Patient Instructions (Signed)
Handouts given:  Hemorrhoids, and high fiber diet  YOU HAD AN ENDOSCOPIC PROCEDURE TODAY AT [facility]:   Refer to the procedure report that was given to you for any specific questions about what was found during the examination.  If the procedure report does not answer your questions, please call your gastroenterologist to clarify.  If you requested that your care partner not be given the details of your procedure findings, then the procedure report has been included in a sealed envelope for you to review at your convenience later.  YOU SHOULD EXPECT: Some feelings of bloating in the abdomen. Passage of more gas than usual.  Walking can help get rid of the air that was put into your GI tract during the procedure and reduce the bloating. If you had a lower endoscopy (such as a colonoscopy or flexible sigmoidoscopy) you may notice spotting of blood in your stool or on the toilet paper. If you underwent a bowel prep for your procedure, you may not have a normal bowel movement for a few days.  Please Note:  You might notice some irritation and congestion in your nose or some drainage.  This is from the oxygen used during your procedure.  There is no need for concern and it should clear up in a day or so.  SYMPTOMS TO REPORT IMMEDIATELY:   Following lower endoscopy (colonoscopy or flexible sigmoidoscopy):  Excessive amounts of blood in the stool  Significant tenderness or worsening of abdominal pains  Swelling of the abdomen that is new, acute  Fever of 100F or higher  For urgent or emergent issues, a gastroenterologist can be reached at any hour by calling [phone].   DIET:  We do recommend a small meal at first, but then you may proceed to your regular diet.  Drink plenty of fluids but you should avoid alcoholic beverages for 24 hours.  ACTIVITY:  You should plan to take it easy for the rest of today and you should NOT DRIVE or use heavy machinery until tomorrow (because  of the sedation medicines used during the test).    FOLLOW UP: Our staff will call the number listed on your records the next business day following your procedure to check on you and address any questions or concerns that you may have regarding the information given to you following your procedure. If we do not reach you, we will leave a message.  However, if you are feeling well and you are not experiencing any problems, there is no need to return our call.  We will assume that you have returned to your regular daily activities without incident.  If any biopsies were taken you will be contacted by phone or by letter within the next 1-3 weeks.  Please call us at [phone] if you have not heard about the biopsies in 3 weeks.    SIGNATURES/CONFIDENTIALITY: You and/or your care partner have signed paperwork which will be entered into your electronic medical record.  These signatures attest to the fact that that the information above on your After Visit Summary has been reviewed and is understood.  Full responsibility of the confidentiality of this discharge information lies with you and/or your care-partner.

## 2018-06-11 ENCOUNTER — Telehealth: Payer: Self-pay

## 2018-06-11 NOTE — Telephone Encounter (Signed)
  Follow up Call-  Call back number 06/10/2018  Post procedure Call Back phone  # [phone]  Permission to leave phone message Yes  Some recent data might be hidden     Patient questions:  Do you have a fever, pain , or abdominal swelling? No. Pain Score  0 *  Have you tolerated food without any problems? Yes.    Have you been able to return to your normal activities? Yes.    Do you have any questions about your discharge instructions: Diet   No. Medications  No. Follow up visit  No.  Do you have questions or concerns about your Care? No.  Actions: * If pain score is 4 or above: No action needed, pain <4.

## 2018-06-12 ENCOUNTER — Other Ambulatory Visit: Payer: Self-pay

## 2018-06-12 ENCOUNTER — Emergency Department (HOSPITAL_COMMUNITY)
Admission: EM | Admit: 2018-06-12 | Discharge: 2018-06-12 | Disposition: A | Discharge status: Home / Self Care | Payer: Medicare Other | Attending: Emergency Medicine | Admitting: Emergency Medicine

## 2018-06-12 ENCOUNTER — Encounter (HOSPITAL_COMMUNITY): Payer: Self-pay

## 2018-06-12 DIAGNOSIS — I10 Essential (primary) hypertension: Secondary | ICD-10-CM | POA: Diagnosis not present

## 2018-06-12 DIAGNOSIS — R000 Tachycardia, unspecified: Secondary | ICD-10-CM | POA: Diagnosis not present

## 2018-06-12 DIAGNOSIS — F17210 Nicotine dependence, cigarettes, uncomplicated: Secondary | ICD-10-CM | POA: Diagnosis not present

## 2018-06-12 DIAGNOSIS — N189 Chronic kidney disease, unspecified: Secondary | ICD-10-CM | POA: Diagnosis not present

## 2018-06-12 DIAGNOSIS — Z79899 Other long term (current) drug therapy: Secondary | ICD-10-CM | POA: Diagnosis not present

## 2018-06-12 DIAGNOSIS — I129 Hypertensive chronic kidney disease with stage 1 through stage 4 chronic kidney disease, or unspecified chronic kidney disease: Secondary | ICD-10-CM | POA: Insufficient documentation

## 2018-06-12 DIAGNOSIS — R252 Cramp and spasm: Secondary | ICD-10-CM | POA: Diagnosis not present

## 2018-06-12 LAB — I-STAT CG4 LACTIC ACID, ED
Lactic Acid, Venous: 2.66 mmol/L (ref 0.5–1.9)
Lactic Acid, Venous: 1.89 mmol/L (ref 0.5–1.9)

## 2018-06-12 LAB — CK: Total CK: 370 U/L (ref 49–397)

## 2018-06-12 LAB — URINALYSIS, ROUTINE W REFLEX MICROSCOPIC
Bilirubin Urine: NEGATIVE
Glucose, UA: NEGATIVE mg/dL
Hgb urine dipstick: NEGATIVE
Ketones, ur: 5 mg/dL — AB
Leukocytes, UA: NEGATIVE
Nitrite: NEGATIVE
Protein, ur: NEGATIVE mg/dL
Specific Gravity, Urine: 1.014 (ref 1.005–1.030)
pH: 5 (ref 5.0–8.0)

## 2018-06-12 LAB — CBC WITH DIFFERENTIAL/PLATELET
Basophils Absolute: 0 10*3/uL (ref 0.0–0.1)
Basophils Relative: 0 %
Eosinophils Absolute: 0.1 10*3/uL (ref 0.0–0.7)
Eosinophils Relative: 1 %
HCT: 40.9 % (ref 39.0–52.0)
Hemoglobin: 13.8 g/dL (ref 13.0–17.0)
Lymphocytes Relative: 34 %
Lymphs Abs: 3.7 10*3/uL (ref 0.7–4.0)
MCH: 27.8 pg (ref 26.0–34.0)
MCHC: 33.7 g/dL (ref 30.0–36.0)
MCV: 82.5 fL (ref 78.0–100.0)
Monocytes Absolute: 0.7 10*3/uL (ref 0.1–1.0)
Monocytes Relative: 7 %
Neutro Abs: 6.4 10*3/uL (ref 1.7–7.7)
Neutrophils Relative %: 58 %
Platelets: 341 10*3/uL (ref 150–400)
RBC: 4.96 MIL/uL (ref 4.22–5.81)
RDW: 14.5 % (ref 11.5–15.5)
WBC: 10.9 10*3/uL — ABNORMAL HIGH (ref 4.0–10.5)

## 2018-06-12 LAB — RAPID URINE DRUG SCREEN, HOSP PERFORMED
Amphetamines: NOT DETECTED
Benzodiazepines: NOT DETECTED
Cocaine: NOT DETECTED
Opiates: NOT DETECTED
Tetrahydrocannabinol: NOT DETECTED

## 2018-06-12 LAB — COMPREHENSIVE METABOLIC PANEL
ALT: 35 U/L (ref 17–63)
AST: 34 U/L (ref 15–41)
Albumin: 4.5 g/dL (ref 3.5–5.0)
Alkaline Phosphatase: 90 U/L (ref 38–126)
Anion gap: 12 (ref 5–15)
BUN: 13 mg/dL (ref 6–20)
CO2: 23 mmol/L (ref 22–32)
Calcium: 9.4 mg/dL (ref 8.9–10.3)
Chloride: 103 mmol/L (ref 101–111)
Creatinine, Ser: 1 mg/dL (ref 0.61–1.24)
GFR calc Af Amer: >60 mL/min (ref >60)
GFR calc non Af Amer: >60 mL/min (ref >60)
Glucose, Bld: 162 mg/dL — ABNORMAL HIGH (ref 65–99)
Potassium: 4.5 mmol/L (ref 3.5–5.1)
Sodium: 138 mmol/L (ref 135–145)
Total Bilirubin: 0.5 mg/dL (ref 0.3–1.2)
Total Protein: 8.3 g/dL — ABNORMAL HIGH (ref 6.5–8.1)

## 2018-06-12 LAB — MAGNESIUM: Magnesium: 2.1 mg/dL (ref 1.7–2.4)

## 2018-06-12 LAB — I-STAT TROPONIN, ED: Troponin i, poc: 0.01 ng/mL (ref 0.00–0.08)

## 2018-06-12 MED ORDER — SODIUM CHLORIDE 0.9 % IV BOLUS
1000.0000 mL | Freq: Once | INTRAVENOUS | Status: AC
Start: 2018-06-12 — End: 2018-06-12
  Administered 2018-06-12: 1000 mL via INTRAVENOUS

## 2018-06-12 MED ORDER — DIAZEPAM 5 MG PO TABS
5.0000 mg | ORAL_TABLET | Freq: Once | ORAL | Status: AC
  Administered 2018-06-12: 5 mg via ORAL
  Filled 2018-06-12: qty 1

## 2018-06-12 NOTE — ED Notes (Signed)
Bed: WTR5 Expected date:  Expected time:  Means of arrival:  Comments: 

## 2018-06-12 NOTE — Discharge Instructions (Addendum)
Make sure to drink plenty of fluids.  Avoid high sugar and high caffeine beverages.  Try Gatorade or Pedialyte.  Please follow-up with your family doctor as needed
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
## 2018-06-12 NOTE — ED Triage Notes (Addendum)
Per EMS- Patient c/o muscle cramps mainly in his hands,but does have them elsewhere x 3 days. Patient states he was prescribed a muscle relaxant,but it is not working.  Patient added at the end of triage that he took Robaxin 2 tabs at 0300 and 2 more tabs at 0600 today.

## 2018-06-12 NOTE — ED Notes (Signed)
Pt c/o generalized body aches, he sts he is a newly diagnosed diabetic, reprots excessive thirst and urination.

## 2018-06-13 ENCOUNTER — Other Ambulatory Visit: Payer: Self-pay | Admitting: *Deleted

## 2018-06-13 NOTE — Patient Outreach (Signed)
[facility]) Care Management  06/13/2018  Jacob [name] 03-Aug-1966 [id number]   Telephone Screen  Referral Date:  06/13/2018 Referral Source:  [facility] ED Census Reason for Referral:  6 or more ED visits in the past 6 months Insurance:  Medicare   Outreach Attempt:  Successful telephone outreach to patient for telephone screening.  HIPAA verified with patient.  No primary care provider listed for patient.  RN Health Coach confirmed patient had obtained primary care provider, Dr. Iona Beard Osei-Bonsu.  States he has seen Dr. Vista Lawman twice so far.  RN Health Coach has screened patient in the recent pass and patient has [facility] brochure.  Dr. Vista Lawman currently not [facility] Provider for [employer].  RN Health Coach will close case at this time.  Plan:  RN Health Coach will close case based on primary care provider not [place] provider for [employer] patients.   [place] [phone] [name]

## 2018-07-04 DIAGNOSIS — G894 Chronic pain syndrome: Secondary | ICD-10-CM | POA: Diagnosis not present

## 2018-07-04 DIAGNOSIS — Z720 Tobacco use: Secondary | ICD-10-CM | POA: Diagnosis not present

## 2018-07-04 DIAGNOSIS — E1165 Type 2 diabetes mellitus with hyperglycemia: Secondary | ICD-10-CM | POA: Diagnosis not present

## 2018-07-04 DIAGNOSIS — E669 Obesity, unspecified: Secondary | ICD-10-CM | POA: Diagnosis not present

## 2018-07-04 DIAGNOSIS — E785 Hyperlipidemia, unspecified: Secondary | ICD-10-CM | POA: Diagnosis not present

## 2018-07-04 DIAGNOSIS — I10 Essential (primary) hypertension: Secondary | ICD-10-CM | POA: Diagnosis not present

## 2018-07-07 ENCOUNTER — Other Ambulatory Visit: Payer: Self-pay

## 2018-07-07 ENCOUNTER — Emergency Department (HOSPITAL_COMMUNITY): Payer: Medicare Other

## 2018-07-07 ENCOUNTER — Emergency Department (HOSPITAL_COMMUNITY)
Admission: EM | Admit: 2018-07-07 | Discharge: 2018-07-08 | Disposition: A | Discharge status: Home / Self Care | Payer: Medicare Other | Attending: Emergency Medicine | Admitting: Emergency Medicine

## 2018-07-07 ENCOUNTER — Encounter (HOSPITAL_COMMUNITY): Payer: Self-pay | Admitting: Emergency Medicine

## 2018-07-07 DIAGNOSIS — R0789 Other chest pain: Secondary | ICD-10-CM | POA: Insufficient documentation

## 2018-07-07 DIAGNOSIS — R252 Cramp and spasm: Secondary | ICD-10-CM

## 2018-07-07 DIAGNOSIS — R000 Tachycardia, unspecified: Secondary | ICD-10-CM | POA: Diagnosis not present

## 2018-07-07 DIAGNOSIS — I10 Essential (primary) hypertension: Secondary | ICD-10-CM | POA: Insufficient documentation

## 2018-07-07 DIAGNOSIS — Z79899 Other long term (current) drug therapy: Secondary | ICD-10-CM | POA: Insufficient documentation

## 2018-07-07 DIAGNOSIS — F17210 Nicotine dependence, cigarettes, uncomplicated: Secondary | ICD-10-CM | POA: Diagnosis not present

## 2018-07-07 DIAGNOSIS — Z8673 Personal history of transient ischemic attack (TIA), and cerebral infarction without residual deficits: Secondary | ICD-10-CM | POA: Insufficient documentation

## 2018-07-07 DIAGNOSIS — R079 Chest pain, unspecified: Secondary | ICD-10-CM | POA: Diagnosis not present

## 2018-07-07 LAB — BASIC METABOLIC PANEL
Anion gap: 13 (ref 5–15)
BUN: 10 mg/dL (ref 6–20)
CO2: 23 mmol/L (ref 22–32)
Calcium: 9.1 mg/dL (ref 8.9–10.3)
Chloride: 103 mmol/L (ref 98–111)
Creatinine, Ser: 1.13 mg/dL (ref 0.61–1.24)
GFR calc Af Amer: >60 mL/min (ref >60)
GFR calc non Af Amer: >60 mL/min (ref >60)
Glucose, Bld: 115 mg/dL — ABNORMAL HIGH (ref 70–99)
Potassium: 4.1 mmol/L (ref 3.5–5.1)
Sodium: 139 mmol/L (ref 135–145)

## 2018-07-07 LAB — CBC
HCT: 39.6 % (ref 39.0–52.0)
Hemoglobin: 12.5 g/dL — ABNORMAL LOW (ref 13.0–17.0)
MCH: 26.7 pg (ref 26.0–34.0)
MCHC: 31.6 g/dL (ref 30.0–36.0)
MCV: 84.6 fL (ref 78.0–100.0)
Platelets: 351 10*3/uL (ref 150–400)
RBC: 4.68 MIL/uL (ref 4.22–5.81)
RDW: 14.2 % (ref 11.5–15.5)
WBC: 10.9 10*3/uL — ABNORMAL HIGH (ref 4.0–10.5)

## 2018-07-07 LAB — I-STAT TROPONIN, ED: Troponin i, poc: 0 ng/mL (ref 0.00–0.08)

## 2018-07-07 NOTE — ED Triage Notes (Addendum)
Pt reports he has been out of HCTZ for over a week, states he feels "my heart acting different." Reports chest pain, shob, poor appetite, dry mouth starting last night. Called 911 earlier today but refused transport.

## 2018-07-07 NOTE — ED Triage Notes (Signed)
Also concerned for sinus infection

## 2018-07-08 DIAGNOSIS — R0789 Other chest pain: Secondary | ICD-10-CM | POA: Diagnosis not present

## 2018-07-08 MED ORDER — DIAZEPAM 5 MG PO TABS
5.0000 mg | ORAL_TABLET | Freq: Once | ORAL | Status: AC
Start: 2018-07-08 — End: 2018-07-08
  Administered 2018-07-08: 5 mg via ORAL
  Filled 2018-07-08: qty 1

## 2018-07-08 MED ORDER — KETOROLAC TROMETHAMINE 60 MG/2ML IM SOLN
60.0000 mg | Freq: Once | INTRAMUSCULAR | Status: AC
  Administered 2018-07-08: 60 mg via INTRAMUSCULAR
  Filled 2018-07-08: qty 2
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
## 2018-07-08 NOTE — Discharge Instructions (Addendum)
Continue your medications as previously prescribed.  Follow up with your primary doctor if symptoms are not improving in the next 3 to 4 days.

## 2018-07-12 ENCOUNTER — Telehealth (INDEPENDENT_AMBULATORY_CARE_PROVIDER_SITE_OTHER): Payer: Self-pay | Admitting: Orthopedic Surgery

## 2018-07-12 DIAGNOSIS — H04123 Dry eye syndrome of bilateral lacrimal glands: Secondary | ICD-10-CM | POA: Diagnosis not present

## 2018-07-12 DIAGNOSIS — E119 Type 2 diabetes mellitus without complications: Secondary | ICD-10-CM | POA: Diagnosis not present

## 2018-07-12 DIAGNOSIS — H2513 Age-related nuclear cataract, bilateral: Secondary | ICD-10-CM | POA: Diagnosis not present

## 2018-07-12 NOTE — Telephone Encounter (Signed)
Patient has a issue with the current pain management facility that Dr. Sharol Given referred him to, he would like to be referred to a different pain management clinic.  PF#[phone]

## 2018-07-12 NOTE — Telephone Encounter (Signed)
Talked with patient and he stated that he has been going to [facility] for about 6 months, but would like to be referred to a better Pain Clinic.  Stated that he hasn't failed a drug test since he has been going there.  Patient aware that Dr. Sharol Given is not in the office today and can wait until he returns.  Please advise.

## 2018-07-15 NOTE — Telephone Encounter (Signed)
Call patient.  It is difficult to switch pain clinics.  If patient had a pain clinic he was interested in we could make the referral but again it is difficult to be accepted by a new pain clinic.
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
## 2018-07-16 NOTE — Telephone Encounter (Signed)
Left voicemail at [facility] that patient would like to be contacted for follow up appt.

## 2018-07-19 DIAGNOSIS — I10 Essential (primary) hypertension: Secondary | ICD-10-CM | POA: Diagnosis not present

## 2018-07-19 DIAGNOSIS — E669 Obesity, unspecified: Secondary | ICD-10-CM | POA: Diagnosis not present

## 2018-07-19 DIAGNOSIS — Z720 Tobacco use: Secondary | ICD-10-CM | POA: Diagnosis not present

## 2018-07-19 DIAGNOSIS — E785 Hyperlipidemia, unspecified: Secondary | ICD-10-CM | POA: Diagnosis not present

## 2018-07-19 DIAGNOSIS — Z0000 Encounter for general adult medical examination without abnormal findings: Secondary | ICD-10-CM | POA: Diagnosis not present

## 2018-07-19 DIAGNOSIS — G894 Chronic pain syndrome: Secondary | ICD-10-CM | POA: Diagnosis not present

## 2018-07-19 DIAGNOSIS — E1165 Type 2 diabetes mellitus with hyperglycemia: Secondary | ICD-10-CM | POA: Diagnosis not present

## 2018-08-01 ENCOUNTER — Ambulatory Visit: Payer: Self-pay | Admitting: Skilled Nursing Facility1

## 2018-08-19 DIAGNOSIS — M545 Low back pain: Secondary | ICD-10-CM | POA: Diagnosis not present

## 2018-08-19 DIAGNOSIS — M79605 Pain in left leg: Secondary | ICD-10-CM | POA: Diagnosis not present

## 2018-08-19 DIAGNOSIS — M79604 Pain in right leg: Secondary | ICD-10-CM | POA: Diagnosis not present

## 2018-08-19 DIAGNOSIS — G894 Chronic pain syndrome: Secondary | ICD-10-CM | POA: Diagnosis not present

## 2018-09-02 ENCOUNTER — Other Ambulatory Visit (INDEPENDENT_AMBULATORY_CARE_PROVIDER_SITE_OTHER): Payer: Self-pay | Admitting: Orthopedic Surgery

## 2018-09-03 DIAGNOSIS — G894 Chronic pain syndrome: Secondary | ICD-10-CM | POA: Diagnosis not present

## 2018-09-03 DIAGNOSIS — I10 Essential (primary) hypertension: Secondary | ICD-10-CM | POA: Diagnosis not present

## 2018-09-03 DIAGNOSIS — E1165 Type 2 diabetes mellitus with hyperglycemia: Secondary | ICD-10-CM | POA: Diagnosis not present

## 2018-09-03 DIAGNOSIS — E669 Obesity, unspecified: Secondary | ICD-10-CM | POA: Diagnosis not present

## 2018-09-03 DIAGNOSIS — Z720 Tobacco use: Secondary | ICD-10-CM | POA: Diagnosis not present

## 2018-09-03 DIAGNOSIS — E785 Hyperlipidemia, unspecified: Secondary | ICD-10-CM | POA: Diagnosis not present

## 2018-09-16 ENCOUNTER — Encounter (HOSPITAL_COMMUNITY): Payer: Self-pay | Admitting: Emergency Medicine

## 2018-09-16 ENCOUNTER — Other Ambulatory Visit: Payer: Self-pay

## 2018-09-16 ENCOUNTER — Emergency Department (HOSPITAL_COMMUNITY): Payer: Medicare Other

## 2018-09-16 ENCOUNTER — Emergency Department (HOSPITAL_COMMUNITY)
Admission: EM | Admit: 2018-09-16 | Discharge: 2018-09-17 | Disposition: A | Discharge status: Home / Self Care | Payer: Medicare Other | Attending: Emergency Medicine | Admitting: Emergency Medicine

## 2018-09-16 DIAGNOSIS — R059 Cough, unspecified: Secondary | ICD-10-CM

## 2018-09-16 DIAGNOSIS — M791 Myalgia, unspecified site: Secondary | ICD-10-CM | POA: Diagnosis not present

## 2018-09-16 DIAGNOSIS — I10 Essential (primary) hypertension: Secondary | ICD-10-CM | POA: Diagnosis not present

## 2018-09-16 DIAGNOSIS — R05 Cough: Secondary | ICD-10-CM | POA: Diagnosis not present

## 2018-09-16 DIAGNOSIS — Z8673 Personal history of transient ischemic attack (TIA), and cerebral infarction without residual deficits: Secondary | ICD-10-CM | POA: Diagnosis not present

## 2018-09-16 DIAGNOSIS — F17210 Nicotine dependence, cigarettes, uncomplicated: Secondary | ICD-10-CM | POA: Diagnosis not present
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
## 2018-09-16 NOTE — ED Notes (Signed)
Bed: WA12 Expected date:  Expected time:  Means of arrival:  Comments: 

## 2018-09-16 NOTE — ED Triage Notes (Signed)
Pt from home with c/o productive cough x 1 day. Pt has a juice bottle filled with white sputum he has coughed up today. Pt is able to speak in complete sentences and has oxygen saturation of 100% RA  EMS had vital signs of 140/102 120bmp 16rr

## 2018-09-17 MED ORDER — BENZONATATE 100 MG PO CAPS: 100.0000 mg | ORAL_CAPSULE | Freq: Three times a day (TID) | ORAL | 0 refills | Status: DC

## 2018-09-17 NOTE — Discharge Instructions (Addendum)
Your chest x-ray is negative for infection. Continue using Tylenol (can take 2 of your Equate acetaminophen every 6 hours), push fluids, take Tessalon for cough. If you have a vaporizer or humidifier for your room this may help provide relief as well. Follow up with your doctor for recheck this week.

## 2018-09-20 ENCOUNTER — Other Ambulatory Visit: Payer: Self-pay | Admitting: *Deleted

## 2018-09-20 NOTE — Patient Outreach (Signed)
[facility]) Care Management  09/20/2018  ERHARDT DADA 02/08/1966 [id number]    Outreach Attempt:  ED Utilizer referral received.  Patient has Medicare and Dr. Vista Lawman is patient's primary care provider.  Dr. Vista Lawman is not a [facility] Provider for Medicare Next Generation patients.  Plan:  RN Health Coach will close case based on patient not having [facility] network provider.  [place] [phone] [email or web address]

## 2018-10-13 ENCOUNTER — Other Ambulatory Visit: Payer: Self-pay

## 2018-10-13 ENCOUNTER — Emergency Department (HOSPITAL_COMMUNITY)
Admission: EM | Admit: 2018-10-13 | Discharge: 2018-10-13 | Disposition: A | Discharge status: Home / Self Care | Payer: Medicare Other | Attending: Emergency Medicine | Admitting: Emergency Medicine

## 2018-10-13 ENCOUNTER — Emergency Department (HOSPITAL_COMMUNITY): Payer: Medicare Other

## 2018-10-13 ENCOUNTER — Encounter (HOSPITAL_COMMUNITY): Payer: Self-pay | Admitting: Emergency Medicine

## 2018-10-13 DIAGNOSIS — I10 Essential (primary) hypertension: Secondary | ICD-10-CM | POA: Diagnosis not present

## 2018-10-13 DIAGNOSIS — Z8673 Personal history of transient ischemic attack (TIA), and cerebral infarction without residual deficits: Secondary | ICD-10-CM | POA: Diagnosis not present

## 2018-10-13 DIAGNOSIS — R05 Cough: Secondary | ICD-10-CM | POA: Diagnosis not present

## 2018-10-13 DIAGNOSIS — Z79899 Other long term (current) drug therapy: Secondary | ICD-10-CM | POA: Insufficient documentation

## 2018-10-13 DIAGNOSIS — R059 Cough, unspecified: Secondary | ICD-10-CM

## 2018-10-13 DIAGNOSIS — F17210 Nicotine dependence, cigarettes, uncomplicated: Secondary | ICD-10-CM | POA: Insufficient documentation

## 2018-10-13 DIAGNOSIS — R197 Diarrhea, unspecified: Secondary | ICD-10-CM

## 2018-10-13 DIAGNOSIS — R918 Other nonspecific abnormal finding of lung field: Secondary | ICD-10-CM | POA: Diagnosis not present

## 2018-10-13 LAB — C DIFFICILE QUICK SCREEN W PCR REFLEX
C Diff antigen: NEGATIVE
C Diff interpretation: NOT DETECTED
C Diff toxin: NEGATIVE

## 2018-10-13 LAB — CBC
HCT: 43.9 % (ref 39.0–52.0)
Hemoglobin: 14 g/dL (ref 13.0–17.0)
MCH: 27 pg (ref 26.0–34.0)
MCHC: 31.9 g/dL (ref 30.0–36.0)
MCV: 84.6 fL (ref 80.0–100.0)
Platelets: 413 10*3/uL — ABNORMAL HIGH (ref 150–400)
RBC: 5.19 MIL/uL (ref 4.22–5.81)
RDW: 13.9 % (ref 11.5–15.5)
WBC: 12.6 10*3/uL — ABNORMAL HIGH (ref 4.0–10.5)
nRBC: 0 % (ref 0.0–0.2)

## 2018-10-13 LAB — COMPREHENSIVE METABOLIC PANEL
ALT: 18 U/L (ref 0–44)
AST: 22 U/L (ref 15–41)
Albumin: 4.1 g/dL (ref 3.5–5.0)
Alkaline Phosphatase: 69 U/L (ref 38–126)
Anion gap: 16 — ABNORMAL HIGH (ref 5–15)
BUN: 20 mg/dL (ref 6–20)
CO2: 27 mmol/L (ref 22–32)
Calcium: 10.4 mg/dL — ABNORMAL HIGH (ref 8.9–10.3)
Chloride: 93 mmol/L — ABNORMAL LOW (ref 98–111)
Creatinine, Ser: 1.19 mg/dL (ref 0.61–1.24)
GFR calc Af Amer: >60 mL/min (ref >60)
GFR calc non Af Amer: >60 mL/min (ref >60)
Glucose, Bld: 90 mg/dL (ref 70–99)
Potassium: 4.3 mmol/L (ref 3.5–5.1)
Sodium: 136 mmol/L (ref 135–145)
Total Bilirubin: 0.3 mg/dL (ref 0.3–1.2)
Total Protein: 7.8 g/dL (ref 6.5–8.1)

## 2018-10-13 LAB — URINALYSIS, ROUTINE W REFLEX MICROSCOPIC
Bilirubin Urine: NEGATIVE
Glucose, UA: NEGATIVE mg/dL
Ketones, ur: 5 mg/dL — AB
Leukocytes, UA: NEGATIVE
Nitrite: NEGATIVE
Protein, ur: NEGATIVE mg/dL
Specific Gravity, Urine: 1.01 (ref 1.005–1.030)
pH: 8 (ref 5.0–8.0)

## 2018-10-13 LAB — LIPASE, BLOOD: Lipase: 33 U/L (ref 11–51)

## 2018-10-13 MED ORDER — DIPHENOXYLATE-ATROPINE 2.5-0.025 MG PO TABS: 1.0000 | ORAL_TABLET | Freq: Four times a day (QID) | ORAL | 0 refills | Status: DC | PRN

## 2018-10-13 MED ORDER — ACETAMINOPHEN 325 MG PO TABS
650.0000 mg | ORAL_TABLET | Freq: Once | ORAL | Status: AC
  Administered 2018-10-13: 650 mg via ORAL
  Filled 2018-10-13: qty 2
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
## 2018-10-13 NOTE — ED Triage Notes (Signed)
Patient reports n/v/d x 18 hours, also reports productive, rhinorrhea and scratchy throat x5 days. Has not taken any meds for symptoms.

## 2018-10-13 NOTE — ED Notes (Signed)
Patient seen drinking a pepsi while waiting to be seen. tolerating PO.

## 2018-10-13 NOTE — ED Notes (Signed)
Patient transported to X-ray 

## 2018-10-18 DIAGNOSIS — E785 Hyperlipidemia, unspecified: Secondary | ICD-10-CM | POA: Diagnosis not present

## 2018-10-18 DIAGNOSIS — I10 Essential (primary) hypertension: Secondary | ICD-10-CM | POA: Diagnosis not present

## 2018-10-18 DIAGNOSIS — Z23 Encounter for immunization: Secondary | ICD-10-CM | POA: Diagnosis not present

## 2018-10-18 DIAGNOSIS — E669 Obesity, unspecified: Secondary | ICD-10-CM | POA: Diagnosis not present

## 2018-10-18 DIAGNOSIS — G894 Chronic pain syndrome: Secondary | ICD-10-CM | POA: Diagnosis not present

## 2018-10-18 DIAGNOSIS — R05 Cough: Secondary | ICD-10-CM | POA: Diagnosis not present

## 2018-10-18 DIAGNOSIS — Z720 Tobacco use: Secondary | ICD-10-CM | POA: Diagnosis not present

## 2018-10-18 DIAGNOSIS — B80 Enterobiasis: Secondary | ICD-10-CM | POA: Diagnosis not present

## 2018-10-18 DIAGNOSIS — E1165 Type 2 diabetes mellitus with hyperglycemia: Secondary | ICD-10-CM | POA: Diagnosis not present

## 2019-01-06 DIAGNOSIS — G894 Chronic pain syndrome: Secondary | ICD-10-CM | POA: Diagnosis not present

## 2019-01-06 DIAGNOSIS — N528 Other male erectile dysfunction: Secondary | ICD-10-CM | POA: Diagnosis not present

## 2019-01-06 DIAGNOSIS — Z720 Tobacco use: Secondary | ICD-10-CM | POA: Diagnosis not present

## 2019-01-06 DIAGNOSIS — E669 Obesity, unspecified: Secondary | ICD-10-CM | POA: Diagnosis not present

## 2019-01-06 DIAGNOSIS — I10 Essential (primary) hypertension: Secondary | ICD-10-CM | POA: Diagnosis not present

## 2019-01-06 DIAGNOSIS — M25562 Pain in left knee: Secondary | ICD-10-CM | POA: Diagnosis not present

## 2019-01-06 DIAGNOSIS — E1165 Type 2 diabetes mellitus with hyperglycemia: Secondary | ICD-10-CM | POA: Diagnosis not present

## 2019-01-06 DIAGNOSIS — E785 Hyperlipidemia, unspecified: Secondary | ICD-10-CM | POA: Diagnosis not present

## 2019-01-08 ENCOUNTER — Telehealth (INDEPENDENT_AMBULATORY_CARE_PROVIDER_SITE_OTHER): Payer: Self-pay | Admitting: Orthopedic Surgery

## 2019-01-08 NOTE — Telephone Encounter (Signed)
Ora, I did not catch the facility she was calling from on the voicemail, but she was fu in regards to a fax that was sent over.  CB#[phone].  Thank you.

## 2019-01-10 NOTE — Telephone Encounter (Signed)
I looked through Dr. Jess Barters to sign stack and I did not see anything for signature. Do you have something on this pt he may have already signed?

## 2019-01-10 NOTE — Telephone Encounter (Signed)
IC, informed not received. Will re send

## 2019-01-14 DIAGNOSIS — M1712 Unilateral primary osteoarthritis, left knee: Secondary | ICD-10-CM | POA: Diagnosis not present

## 2019-01-17 ENCOUNTER — Telehealth (INDEPENDENT_AMBULATORY_CARE_PROVIDER_SITE_OTHER): Payer: Self-pay | Admitting: Orthopedic Surgery

## 2019-01-17 NOTE — Telephone Encounter (Signed)
Faxed back denied. We are an orthopedic office and they need to obtain this from the pts PCP or endocrinologist.

## 2019-01-17 NOTE — Telephone Encounter (Signed)
Shel[place] from [employer] left a message in regards to a fax that was sent over for the patient's diabetic medical supplies.  Their fax number is [phone].  I did not catch a CB#

## 2019-01-25 DIAGNOSIS — J189 Pneumonia, unspecified organism: Secondary | ICD-10-CM

## 2019-01-25 HISTORY — DX: Pneumonia, unspecified organism: J18.9

## 2019-01-30 DIAGNOSIS — R0689 Other abnormalities of breathing: Secondary | ICD-10-CM | POA: Diagnosis not present

## 2019-01-30 DIAGNOSIS — R140 Abdominal distension (gaseous): Secondary | ICD-10-CM | POA: Diagnosis not present

## 2019-01-30 DIAGNOSIS — R0902 Hypoxemia: Secondary | ICD-10-CM | POA: Diagnosis not present

## 2019-01-30 DIAGNOSIS — M25562 Pain in left knee: Secondary | ICD-10-CM | POA: Diagnosis not present

## 2019-01-30 DIAGNOSIS — R52 Pain, unspecified: Secondary | ICD-10-CM | POA: Diagnosis not present

## 2019-01-31 ENCOUNTER — Encounter (HOSPITAL_COMMUNITY): Payer: Self-pay | Admitting: Emergency Medicine

## 2019-01-31 ENCOUNTER — Inpatient Hospital Stay (HOSPITAL_COMMUNITY)
Admission: EM | Admit: 2019-01-31 | Discharge: 2019-02-01 | DRG: 193 | Disposition: A | Discharge status: Home / Self Care | Payer: Medicare Other | Attending: Internal Medicine | Admitting: Internal Medicine

## 2019-01-31 ENCOUNTER — Emergency Department (HOSPITAL_COMMUNITY): Payer: Medicare Other

## 2019-01-31 ENCOUNTER — Ambulatory Visit (INDEPENDENT_AMBULATORY_CARE_PROVIDER_SITE_OTHER): Payer: Medicare Other | Admitting: Physician Assistant

## 2019-01-31 ENCOUNTER — Other Ambulatory Visit: Payer: Self-pay

## 2019-01-31 DIAGNOSIS — F14980 Cocaine use, unspecified with cocaine-induced anxiety disorder: Secondary | ICD-10-CM | POA: Diagnosis not present

## 2019-01-31 DIAGNOSIS — Z765 Malingerer [conscious simulation]: Secondary | ICD-10-CM

## 2019-01-31 DIAGNOSIS — Z818 Family history of other mental and behavioral disorders: Secondary | ICD-10-CM | POA: Diagnosis not present

## 2019-01-31 DIAGNOSIS — Z79899 Other long term (current) drug therapy: Secondary | ICD-10-CM | POA: Diagnosis not present

## 2019-01-31 DIAGNOSIS — M25562 Pain in left knee: Secondary | ICD-10-CM

## 2019-01-31 DIAGNOSIS — E119 Type 2 diabetes mellitus without complications: Secondary | ICD-10-CM | POA: Diagnosis not present

## 2019-01-31 DIAGNOSIS — M109 Gout, unspecified: Secondary | ICD-10-CM | POA: Diagnosis present

## 2019-01-31 DIAGNOSIS — M25561 Pain in right knee: Secondary | ICD-10-CM | POA: Diagnosis not present

## 2019-01-31 DIAGNOSIS — F419 Anxiety disorder, unspecified: Secondary | ICD-10-CM | POA: Diagnosis present

## 2019-01-31 DIAGNOSIS — J189 Pneumonia, unspecified organism: Secondary | ICD-10-CM | POA: Diagnosis present

## 2019-01-31 DIAGNOSIS — Z23 Encounter for immunization: Secondary | ICD-10-CM

## 2019-01-31 DIAGNOSIS — F209 Schizophrenia, unspecified: Secondary | ICD-10-CM | POA: Diagnosis not present

## 2019-01-31 DIAGNOSIS — R531 Weakness: Secondary | ICD-10-CM | POA: Diagnosis not present

## 2019-01-31 DIAGNOSIS — F1410 Cocaine abuse, uncomplicated: Secondary | ICD-10-CM | POA: Diagnosis present

## 2019-01-31 DIAGNOSIS — F17210 Nicotine dependence, cigarettes, uncomplicated: Secondary | ICD-10-CM | POA: Diagnosis present

## 2019-01-31 DIAGNOSIS — I10 Essential (primary) hypertension: Secondary | ICD-10-CM | POA: Diagnosis not present

## 2019-01-31 DIAGNOSIS — Z8673 Personal history of transient ischemic attack (TIA), and cerebral infarction without residual deficits: Secondary | ICD-10-CM | POA: Diagnosis not present

## 2019-01-31 DIAGNOSIS — J42 Unspecified chronic bronchitis: Secondary | ICD-10-CM | POA: Diagnosis present

## 2019-01-31 DIAGNOSIS — K219 Gastro-esophageal reflux disease without esophagitis: Secondary | ICD-10-CM | POA: Diagnosis present

## 2019-01-31 DIAGNOSIS — G8929 Other chronic pain: Secondary | ICD-10-CM | POA: Diagnosis present

## 2019-01-31 DIAGNOSIS — Z7984 Long term (current) use of oral hypoglycemic drugs: Secondary | ICD-10-CM

## 2019-01-31 DIAGNOSIS — R0602 Shortness of breath: Secondary | ICD-10-CM | POA: Diagnosis not present

## 2019-01-31 DIAGNOSIS — J9601 Acute respiratory failure with hypoxia: Secondary | ICD-10-CM | POA: Diagnosis present

## 2019-01-31 DIAGNOSIS — M1990 Unspecified osteoarthritis, unspecified site: Secondary | ICD-10-CM | POA: Diagnosis present

## 2019-01-31 DIAGNOSIS — F319 Bipolar disorder, unspecified: Secondary | ICD-10-CM | POA: Diagnosis present

## 2019-01-31 DIAGNOSIS — N179 Acute kidney failure, unspecified: Secondary | ICD-10-CM | POA: Diagnosis present

## 2019-01-31 DIAGNOSIS — M170 Bilateral primary osteoarthritis of knee: Secondary | ICD-10-CM | POA: Diagnosis present

## 2019-01-31 DIAGNOSIS — R7989 Other specified abnormal findings of blood chemistry: Secondary | ICD-10-CM | POA: Diagnosis not present

## 2019-01-31 DIAGNOSIS — M25462 Effusion, left knee: Secondary | ICD-10-CM | POA: Diagnosis not present

## 2019-01-31 LAB — BRAIN NATRIURETIC PEPTIDE: B Natriuretic Peptide: 22.9 pg/mL (ref 0.0–100.0)

## 2019-01-31 LAB — CBC
HCT: 40.5 % (ref 39.0–52.0)
Hemoglobin: 13 g/dL (ref 13.0–17.0)
MCH: 27.7 pg (ref 26.0–34.0)
MCHC: 32.1 g/dL (ref 30.0–36.0)
MCV: 86.4 fL (ref 80.0–100.0)
Platelets: 392 10*3/uL (ref 150–400)
RBC: 4.69 MIL/uL (ref 4.22–5.81)
RDW: 13.9 % (ref 11.5–15.5)
WBC: 16.1 10*3/uL — ABNORMAL HIGH (ref 4.0–10.5)
nRBC: 0 % (ref 0.0–0.2)

## 2019-01-31 LAB — BASIC METABOLIC PANEL
Anion gap: 11 (ref 5–15)
BUN: 14 mg/dL (ref 6–20)
CO2: 25 mmol/L (ref 22–32)
Calcium: 8.7 mg/dL — ABNORMAL LOW (ref 8.9–10.3)
Chloride: 99 mmol/L (ref 98–111)
Creatinine, Ser: 0.91 mg/dL (ref 0.61–1.24)
GFR calc Af Amer: >60 mL/min (ref >60)
GFR calc non Af Amer: >60 mL/min (ref >60)
Glucose, Bld: 168 mg/dL — ABNORMAL HIGH (ref 70–99)
Potassium: 3.8 mmol/L (ref 3.5–5.1)
Sodium: 135 mmol/L (ref 135–145)

## 2019-01-31 LAB — RAPID URINE DRUG SCREEN, HOSP PERFORMED
Amphetamines: NOT DETECTED
Barbiturates: NOT DETECTED
Benzodiazepines: POSITIVE — AB
Cocaine: POSITIVE — AB
Opiates: NOT DETECTED
Tetrahydrocannabinol: NOT DETECTED

## 2019-01-31 LAB — STREP PNEUMONIAE URINARY ANTIGEN: Strep Pneumo Urinary Antigen: NEGATIVE

## 2019-01-31 LAB — D-DIMER, QUANTITATIVE: D-Dimer, Quant: 0.8 ug/mL-FEU — ABNORMAL HIGH (ref 0.00–0.50)

## 2019-01-31 LAB — HEPATIC FUNCTION PANEL
ALT: 33 U/L (ref 0–44)
AST: 33 U/L (ref 15–41)
Albumin: 3.4 g/dL — ABNORMAL LOW (ref 3.5–5.0)
Alkaline Phosphatase: 78 U/L (ref 38–126)
Bilirubin, Direct: <0.1 mg/dL (ref 0.0–0.2)
Total Bilirubin: 0.4 mg/dL (ref 0.3–1.2)
Total Protein: 6.6 g/dL (ref 6.5–8.1)

## 2019-01-31 LAB — HIV ANTIBODY (ROUTINE TESTING W REFLEX): HIV Screen 4th Generation wRfx: NONREACTIVE

## 2019-01-31 LAB — I-STAT TROPONIN, ED: Troponin i, poc: 0.01 ng/mL (ref 0.00–0.08)

## 2019-01-31 LAB — INFLUENZA PANEL BY PCR (TYPE A & B)
Influenza A By PCR: NEGATIVE
Influenza B By PCR: NEGATIVE

## 2019-01-31 MED ORDER — LABETALOL HCL 5 MG/ML IV SOLN: 10.0000 mg | INTRAVENOUS | Status: DC | PRN

## 2019-01-31 MED ORDER — SODIUM CHLORIDE 0.9 % IV SOLN: 1.0000 g | Freq: Once | INTRAVENOUS | Status: DC

## 2019-01-31 MED ORDER — VENLAFAXINE HCL ER 75 MG PO CP24
225.0000 mg | ORAL_CAPSULE | Freq: Every day | ORAL | Status: DC
  Administered 2019-01-31 – 2019-02-01 (×2): 225 mg via ORAL
  Filled 2019-01-31 (×2): qty 1

## 2019-01-31 MED ORDER — LORAZEPAM 1 MG PO TABS
1.5000 mg | ORAL_TABLET | Freq: Every day | ORAL | Status: DC
  Administered 2019-01-31 – 2019-02-01 (×2): 1.5 mg via ORAL
  Filled 2019-01-31 (×2): qty 1

## 2019-01-31 MED ORDER — ALBUTEROL SULFATE (2.5 MG/3ML) 0.083% IN NEBU: 3.0000 mL | INHALATION_SOLUTION | Freq: Four times a day (QID) | RESPIRATORY_TRACT | Status: DC | PRN

## 2019-01-31 MED ORDER — DOCUSATE SODIUM 100 MG PO CAPS: 100.0000 mg | ORAL_CAPSULE | Freq: Every day | ORAL | Status: DC | PRN

## 2019-01-31 MED ORDER — SODIUM CHLORIDE 0.9 % IV SOLN
500.0000 mg | Freq: Once | INTRAVENOUS | Status: AC
  Administered 2019-01-31: 500 mg via INTRAVENOUS
  Filled 2019-01-31: qty 500

## 2019-01-31 MED ORDER — AZITHROMYCIN 250 MG PO TABS
500.0000 mg | ORAL_TABLET | ORAL | Status: DC
  Administered 2019-02-01: 500 mg via ORAL
  Filled 2019-01-31: qty 2

## 2019-01-31 MED ORDER — DICLOFENAC SODIUM 1 % TD GEL
2.0000 g | Freq: Four times a day (QID) | TRANSDERMAL | Status: DC
  Administered 2019-01-31 – 2019-02-01 (×4): 2 g via TOPICAL
  Filled 2019-01-31 (×2): qty 100

## 2019-01-31 MED ORDER — SALINE SPRAY 0.65 % NA SOLN
1.0000 | NASAL | Status: DC | PRN
  Filled 2019-01-31: qty 44

## 2019-01-31 MED ORDER — LORAZEPAM 1 MG PO TABS: 1.5000 mg | ORAL_TABLET | Freq: Every day | ORAL | Status: DC

## 2019-01-31 MED ORDER — SODIUM CHLORIDE 0.9 % IV BOLUS
500.0000 mL | Freq: Once | INTRAVENOUS | Status: AC
  Administered 2019-01-31: 500 mL via INTRAVENOUS

## 2019-01-31 MED ORDER — SODIUM CHLORIDE 0.9% FLUSH
3.0000 mL | Freq: Once | INTRAVENOUS | Status: AC
  Administered 2019-01-31: 3 mL via INTRAVENOUS

## 2019-01-31 MED ORDER — LISINOPRIL 20 MG PO TABS
20.0000 mg | ORAL_TABLET | Freq: Every day | ORAL | Status: DC
  Administered 2019-02-01: 20 mg via ORAL
  Filled 2019-01-31: qty 1

## 2019-01-31 MED ORDER — SODIUM CHLORIDE 0.9 % IV SOLN
2.0000 g | Freq: Once | INTRAVENOUS | Status: AC
  Administered 2019-01-31: 2 g via INTRAVENOUS
  Filled 2019-01-31: qty 20

## 2019-01-31 MED ORDER — ALBUTEROL SULFATE (2.5 MG/3ML) 0.083% IN NEBU
2.5000 mg | INHALATION_SOLUTION | RESPIRATORY_TRACT | Status: DC | PRN
  Administered 2019-02-01: 2.5 mg via RESPIRATORY_TRACT
  Filled 2019-01-31: qty 3

## 2019-01-31 MED ORDER — ACETAMINOPHEN 500 MG PO TABS
1000.0000 mg | ORAL_TABLET | Freq: Four times a day (QID) | ORAL | Status: DC
  Administered 2019-01-31 – 2019-02-01 (×3): 1000 mg via ORAL
  Filled 2019-01-31 (×3): qty 2

## 2019-01-31 MED ORDER — ENOXAPARIN SODIUM 40 MG/0.4ML ~~LOC~~ SOLN
40.0000 mg | SUBCUTANEOUS | Status: DC
  Administered 2019-01-31: 40 mg via SUBCUTANEOUS
  Filled 2019-01-31: qty 0.4

## 2019-01-31 MED ORDER — ATORVASTATIN CALCIUM 10 MG PO TABS
10.0000 mg | ORAL_TABLET | Freq: Every day | ORAL | Status: DC
  Administered 2019-01-31 – 2019-02-01 (×2): 10 mg via ORAL
  Filled 2019-01-31 (×2): qty 1

## 2019-01-31 MED ORDER — HYDROMORPHONE HCL 1 MG/ML IJ SOLN
1.0000 mg | Freq: Once | INTRAMUSCULAR | Status: AC
  Administered 2019-01-31: 1 mg via INTRAVENOUS
  Filled 2019-01-31: qty 1

## 2019-01-31 MED ORDER — AMLODIPINE BESYLATE 5 MG PO TABS
5.0000 mg | ORAL_TABLET | Freq: Every day | ORAL | Status: DC
  Administered 2019-01-31 – 2019-02-01 (×2): 5 mg via ORAL
  Filled 2019-01-31 (×2): qty 1

## 2019-01-31 MED ORDER — SODIUM CHLORIDE 0.9 % IV SOLN
2.0000 g | INTRAVENOUS | Status: DC
  Administered 2019-02-01: 2 g via INTRAVENOUS
  Filled 2019-01-31: qty 20

## 2019-01-31 MED ORDER — PANTOPRAZOLE SODIUM 40 MG PO TBEC
80.0000 mg | DELAYED_RELEASE_TABLET | Freq: Every day | ORAL | Status: DC
  Administered 2019-01-31 – 2019-02-01 (×2): 80 mg via ORAL
  Filled 2019-01-31 (×2): qty 2

## 2019-01-31 MED ORDER — IBUPROFEN 600 MG PO TABS
600.0000 mg | ORAL_TABLET | Freq: Four times a day (QID) | ORAL | Status: DC | PRN
  Administered 2019-01-31: 600 mg via ORAL
  Filled 2019-01-31: qty 1

## 2019-01-31 MED ORDER — RISPERIDONE 2 MG PO TABS
2.0000 mg | ORAL_TABLET | Freq: Once | ORAL | Status: AC
  Administered 2019-01-31: 2 mg via ORAL
  Filled 2019-01-31: qty 1

## 2019-01-31 MED ORDER — SULINDAC 200 MG PO TABS
200.0000 mg | ORAL_TABLET | Freq: Two times a day (BID) | ORAL | Status: DC
  Administered 2019-01-31 – 2019-02-01 (×3): 200 mg via ORAL
  Filled 2019-01-31 (×3): qty 1

## 2019-01-31 MED ORDER — METHOCARBAMOL 500 MG PO TABS
500.0000 mg | ORAL_TABLET | Freq: Three times a day (TID) | ORAL | Status: DC | PRN
  Administered 2019-01-31 – 2019-02-01 (×4): 500 mg via ORAL
  Filled 2019-01-31 (×4): qty 1

## 2019-01-31 MED ORDER — LISINOPRIL 20 MG PO TABS
20.0000 mg | ORAL_TABLET | Freq: Once | ORAL | Status: AC
  Administered 2019-01-31: 20 mg via ORAL
  Filled 2019-01-31: qty 1

## 2019-01-31 MED ORDER — RISPERIDONE 2 MG PO TABS
2.0000 mg | ORAL_TABLET | Freq: Two times a day (BID) | ORAL | Status: DC
  Administered 2019-01-31 – 2019-02-01 (×3): 2 mg via ORAL
  Filled 2019-01-31 (×3): qty 1

## 2019-01-31 MED ORDER — DOXEPIN HCL 10 MG PO CAPS
20.0000 mg | ORAL_CAPSULE | Freq: Every day | ORAL | Status: DC
  Administered 2019-01-31: 20 mg via ORAL
  Filled 2019-01-31: qty 2

## 2019-01-31 MED ORDER — ORAL CARE MOUTH RINSE: 15.0000 mL | Freq: Two times a day (BID) | OROMUCOSAL | Status: DC

## 2019-01-31 MED ORDER — BENZTROPINE MESYLATE 1 MG PO TABS
1.0000 mg | ORAL_TABLET | Freq: Every day | ORAL | Status: DC
  Administered 2019-01-31: 1 mg via ORAL
  Filled 2019-01-31: qty 1

## 2019-01-31 MED ORDER — ALBUTEROL SULFATE (2.5 MG/3ML) 0.083% IN NEBU
5.0000 mg | INHALATION_SOLUTION | Freq: Once | RESPIRATORY_TRACT | Status: AC
  Administered 2019-01-31: 5 mg via RESPIRATORY_TRACT
  Filled 2019-01-31 (×2): qty 6

## 2019-01-31 MED ORDER — IOPAMIDOL (ISOVUE-370) INJECTION 76%
INTRAVENOUS | Status: AC
  Administered 2019-01-31: 100 mL
  Filled 2019-01-31: qty 100

## 2019-01-31 MED ORDER — PNEUMOCOCCAL VAC POLYVALENT 25 MCG/0.5ML IJ INJ
0.5000 mL | INJECTION | INTRAMUSCULAR | Status: AC
  Administered 2019-02-01: 0.5 mL via INTRAMUSCULAR
  Filled 2019-01-31: qty 0.5
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
## 2019-01-31 NOTE — Progress Notes (Signed)
PROGRESS NOTE    Jacob [name]   IOX:[id number]  DOB: [birth year]  DOA: 01/31/2019 PCP: [name], MD   Brief Narrative:  Jacob [name] is a 53 y.o. male with medical history significant of schizophrenia, hypertension and osteoarthritis who presents with increasing left knee pain. He is found to be hypoxic and have bilateral pneumonia and thus admitted.    Subjective: Cough is better today but not resolved. Left knee is hurting quite a bit but this is chronic.     Assessment & Plan:   Principal Problem:   Acute respiratory failure with hypoxia   CAP (community acquired pneumonia)  Leukocytosis, Tachycardia- sepsis - he has bilateral pneumonia - continue to treat with Ceftriaxone and Azithromycin, nebs, O2 - Influenza and Urine strep negative   Active Problems: Nicotine abuse - he likely has chronic bronchitis as he states he has been coughing up large amounts of clear sputum for months now - advised to quit smoking- needs PFTs   Cocaine abuse - complications from this discussed - last used > 1 wk ago - advised to stop using this    Osteoarthritis of knee (Bilateral) (R>L) - swelling noted- he has been told that he needs a replacement and he is pursuing this - cont Suldinac BID- add Voltaren gel- Knee was aspirated last week- does not appear to need aspiration again    Schizophrenia, chronic condition   - cont Risperdal, Effexor, Cogentin, Ativan & Doxepin    Essential hypertension- uncontrolled -cont Lisinopril - as BP is quite high today, add Norvasc and PRN Labetalol- will continue to follow   Time spent in minutes: 35 DVT prophylaxis: Lovenox Code Status: Full code Family Communication:  Disposition Plan: home when stable Consultants:   none Procedures:   none Antimicrobials:  Anti-infectives (From admission, onward)   Start     Dose/Rate Route Frequency Ordered Stop   02/01/19 1000  cefTRIAXone (ROCEPHIN) 2 g in sodium chloride 0.9  % 100 mL IVPB     2 g 200 mL/hr over 30 Minutes Intravenous Every 24 hours 01/31/19 0402 02/08/19 0959   02/01/19 1000  azithromycin (ZITHROMAX) tablet 500 mg     500 mg Oral Every 24 hours 01/31/19 0402 02/08/19 0959   01/31/19 0415  cefTRIAXone (ROCEPHIN) 1 g in sodium chloride 0.9 % 100 mL IVPB  Status:  Discontinued     1 g 200 mL/hr over 30 Minutes Intravenous  Once 01/31/19 0402 01/31/19 0402   01/31/19 0245  cefTRIAXone (ROCEPHIN) 2 g in sodium chloride 0.9 % 100 mL IVPB     2 g 200 mL/hr over 30 Minutes Intravenous  Once 01/31/19 0237 01/31/19 0411   01/31/19 0245  azithromycin (ZITHROMAX) 500 mg in sodium chloride 0.9 % 250 mL IVPB     500 mg 250 mL/hr over 60 Minutes Intravenous  Once 01/31/19 0237 01/31/19 0446       Objective: Vitals:   01/31/19 0415 01/31/19 0430 01/31/19 0518 01/31/19 0800  BP: (!) 145/78 (!) 149/87 (!) 169/100 (!) 184/108  Pulse:  (!) 114 (!) 118 (!) 120  Resp: (!) 31 (!) 27 (!) 22   Temp:   98 F (36.7 C) 97.8 F (36.6 C)  TempSrc:   Oral   SpO2:  95% 96% 98%  Weight:   105.4 kg   Height:   6\' 1"  (1.854 m)     Intake/Output Summary (Last 24 hours) at 01/31/2019 1334 Last data filed at 01/31/2019 1130 Gross per 24 hour  Intake 1860 ml  Output 3600 ml  Net -1740 ml   Filed Weights   01/31/19 0042 01/31/19 0518  Weight: 107.5 kg 105.4 kg    Examination: General exam: Appears comfortable  HEENT: PERRLA, oral mucosa moist, no sclera icterus or thrush Respiratory system: congested cough, mild rhonchi, Respiratory effort normal. Cardiovascular system: S1 & S2 heard, RRR.  Tachycardic Gastrointestinal system: Abdomen soft, non-tender, nondistended. Normal bowel sounds. Central nervous system: Alert and oriented. No focal neurological deficits. Extremities: No cyanosis, clubbing or edema Skin: No rashes or ulcers Psychiatry:  Mood & affect appropriate.     Data Reviewed: I have personally reviewed following labs and imaging  studies  CBC: Recent Labs  Lab 01/31/19 0055  WBC 16.1*  HGB 13.0  HCT 40.5  MCV 86.4  PLT 350   Basic Metabolic Panel: Recent Labs  Lab 01/31/19 0055  NA 135  K 3.8  CL 99  CO2 25  GLUCOSE 168*  BUN 14  CREATININE 0.91  CALCIUM 8.7*   GFR: Estimated Creatinine Clearance: 122.4 mL/min (by C-G formula based on SCr of 0.91 mg/dL). Liver Function Tests: Recent Labs  Lab 01/31/19 0055  AST 33  ALT 33  ALKPHOS 78  BILITOT 0.4  PROT 6.6  ALBUMIN 3.4*   No results for input(s): LIPASE, AMYLASE in the last 168 hours. No results for input(s): AMMONIA in the last 168 hours. Coagulation Profile: No results for input(s): INR, PROTIME in the last 168 hours. Cardiac Enzymes: No results for input(s): CKTOTAL, CKMB, CKMBINDEX, TROPONINI in the last 168 hours. BNP (last 3 results) No results for input(s): PROBNP in the last 8760 hours. HbA1C: No results for input(s): HGBA1C in the last 72 hours. CBG: No results for input(s): GLUCAP in the last 168 hours. Lipid Profile: No results for input(s): CHOL, HDL, LDLCALC, TRIG, CHOLHDL, LDLDIRECT in the last 72 hours. Thyroid Function Tests: No results for input(s): TSH, T4TOTAL, FREET4, T3FREE, THYROIDAB in the last 72 hours. Anemia Panel: No results for input(s): VITAMINB12, FOLATE, FERRITIN, TIBC, IRON, RETICCTPCT in the last 72 hours. Urine analysis:    Component Value Date/Time   COLORURINE YELLOW 10/13/2018 [place] 10/13/2018 0447   LABSPEC 1.010 10/13/2018 0447   PHURINE 8.0 10/13/2018 0447   GLUCOSEU NEGATIVE 10/13/2018 0447   HGBUR SMALL (A) 10/13/2018 0447   BILIRUBINUR NEGATIVE 10/13/2018 0447   KETONESUR 5 (A) 10/13/2018 0447   PROTEINUR NEGATIVE 10/13/2018 0447   UROBILINOGEN 0.2 01/10/2015 1956   NITRITE NEGATIVE 10/13/2018 0447   LEUKOCYTESUR NEGATIVE 10/13/2018 0447   Sepsis Labs: @LABRCNTIP (procalcitonin:4,lacticidven:4) )No results found for this or any previous visit (from the past  240 hour(s)).       Radiology Studies: Ct Angio Chest Pe W Or Wo Contrast  Result Date: 01/31/2019 CLINICAL DATA:  Rhonchi and shortness of breath. Positive D-dimer. EXAM: CT ANGIOGRAPHY CHEST WITH CONTRAST TECHNIQUE: Multidetector CT imaging of the chest was performed using the standard protocol during bolus administration of intravenous contrast. Multiplanar CT image reconstructions and MIPs were obtained to evaluate the vascular anatomy. CONTRAST:  159mL ISOVUE-370 IOPAMIDOL (ISOVUE-370) INJECTION 76% COMPARISON:  None. FINDINGS: Cardiovascular: Moderately good opacification of the central and proximal segmental pulmonary arteries. No filling defects demonstrated suggesting no evidence of central pulmonary embolus. Peripheral vessels are not well opacified and can not be evaluated. Normal caliber thoracic aorta. No aortic dissection. Normal heart size. No pericardial effusions. Coronary artery calcifications. Mediastinum/Nodes: Esophagus is mostly decompressed. No significant lymphadenopathy in the chest. Lungs/Pleura:  Motion artifact limits examination. Patchy infiltrates in both lungs most prominent in the bases. This could represent atelectasis or pneumonia. No pleural effusions. No pneumothorax. Airways are patent. Upper Abdomen: No acute changes identified. Musculoskeletal: Degenerative changes in the spine. Anterior compression of T12 without change since prior study. Review of the MIP images confirms the above findings. IMPRESSION: No evidence of significant pulmonary embolus. Patchy infiltrates in both lungs may represent atelectasis or pneumonia. Electronically Signed   By: Lucienne Capers M.D.   On: 01/31/2019 02:20   Dg Chest Portable 1 View  Result Date: 01/31/2019 CLINICAL DATA:  Shortness of breath EXAM: PORTABLE CHEST 1 VIEW COMPARISON:  10/13/2018 FINDINGS: The heart size and mediastinal contours are within normal limits. Both lungs are clear. The visualized skeletal structures are  unremarkable. IMPRESSION: No active disease. Electronically Signed   By: Kathreen Devoid   On: 01/31/2019 00:58   Dg Knee Left Port  Result Date: 01/31/2019 CLINICAL DATA:  Left knee pain EXAM: PORTABLE LEFT KNEE - 1-2 VIEW COMPARISON:  07/15/2016 FINDINGS: No acute fracture or dislocation. Generalized osteopenia. Severe lateral femorotibial compartment joint space narrowing. Mild medial femorotibial compartment joint space narrowing. Severe patellofemoral compartment joint space narrowing. Large joint effusion. Loose body in the suprapatellar joint space. Loose body in the posteromedial joint space. IMPRESSION: 1.  No acute osseous injury of the left knee. 2. Severe tricompartmental osteoarthritis of the left knee. Electronically Signed   By: Kathreen Devoid   On: 01/31/2019 00:59      Scheduled Meds: . amLODipine  5 mg Oral Daily  . atorvastatin  10 mg Oral Daily  . [START ON 02/01/2019] azithromycin  500 mg Oral Q24H  . benztropine  1 mg Oral QHS  . diclofenac sodium  2 g Topical QID  . doxepin  20 mg Oral QHS  . enoxaparin (LOVENOX) injection  40 mg Subcutaneous Q24H  . [START ON 02/01/2019] lisinopril  20 mg Oral Daily  . LORazepam  1.5 mg Oral Daily  . mouth rinse  15 mL Mouth Rinse BID  . pantoprazole  80 mg Oral Daily  . [START ON 02/01/2019] pneumococcal 23 valent vaccine  0.5 mL Intramuscular Tomorrow-1000  . risperiDONE  2 mg Oral BID  . sulindac  200 mg Oral BID  . venlafaxine XR  225 mg Oral Q breakfast   Continuous Infusions: . [START ON 02/01/2019] cefTRIAXone (ROCEPHIN)  IV       LOS: 0 days      Debbe Odea, MD [facility] Pager: [email or web address] Password [email or web address] 01/31/2019, 1:34 PM

## 2019-01-31 NOTE — Progress Notes (Signed)
Patients BP has been reading high, manual taken at 0800 per order and was 184/108.
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
## 2019-01-31 NOTE — ED Notes (Signed)
Patient transported to CT 
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
## 2019-02-01 DIAGNOSIS — J9601 Acute respiratory failure with hypoxia: Secondary | ICD-10-CM

## 2019-02-01 DIAGNOSIS — F209 Schizophrenia, unspecified: Secondary | ICD-10-CM

## 2019-02-01 DIAGNOSIS — I10 Essential (primary) hypertension: Secondary | ICD-10-CM

## 2019-02-01 DIAGNOSIS — F14980 Cocaine use, unspecified with cocaine-induced anxiety disorder: Secondary | ICD-10-CM

## 2019-02-01 DIAGNOSIS — E119 Type 2 diabetes mellitus without complications: Secondary | ICD-10-CM

## 2019-02-01 DIAGNOSIS — J189 Pneumonia, unspecified organism: Principal | ICD-10-CM

## 2019-02-01 DIAGNOSIS — M170 Bilateral primary osteoarthritis of knee: Secondary | ICD-10-CM

## 2019-02-01 LAB — BASIC METABOLIC PANEL WITH GFR
Anion gap: 13 (ref 5–15)
BUN: 9 mg/dL (ref 6–20)
CO2: 27 mmol/L (ref 22–32)
Calcium: 9.2 mg/dL (ref 8.9–10.3)
Chloride: 101 mmol/L (ref 98–111)
Creatinine, Ser: 0.87 mg/dL (ref 0.61–1.24)
GFR calc Af Amer: >60 mL/min
GFR calc non Af Amer: >60 mL/min
Glucose, Bld: 163 mg/dL — ABNORMAL HIGH (ref 70–99)
Potassium: 4.2 mmol/L (ref 3.5–5.1)
Sodium: 141 mmol/L (ref 135–145)

## 2019-02-01 LAB — CBC
HCT: 40.4 % (ref 39.0–52.0)
Hemoglobin: 12.6 g/dL — ABNORMAL LOW (ref 13.0–17.0)
MCH: 26.9 pg (ref 26.0–34.0)
MCHC: 31.2 g/dL (ref 30.0–36.0)
MCV: 86.3 fL (ref 80.0–100.0)
Platelets: 377 10*3/uL (ref 150–400)
RBC: 4.68 MIL/uL (ref 4.22–5.81)
RDW: 13.5 % (ref 11.5–15.5)
WBC: 15.3 10*3/uL — ABNORMAL HIGH (ref 4.0–10.5)
nRBC: 0 % (ref 0.0–0.2)

## 2019-02-01 MED ORDER — AZITHROMYCIN 250 MG PO TABS: 500.0000 mg | ORAL_TABLET | Freq: Every day | ORAL | 0 refills | Status: DC

## 2019-02-01 MED ORDER — CEFUROXIME AXETIL 500 MG PO TABS: 500.0000 mg | ORAL_TABLET | Freq: Two times a day (BID) | ORAL | 0 refills | Status: AC

## 2019-02-01 MED ORDER — AMLODIPINE BESYLATE 10 MG PO TABS: 10.0000 mg | ORAL_TABLET | Freq: Every day | ORAL | 0 refills | Status: DC

## 2019-02-01 NOTE — Discharge Summary (Addendum)
Physician Discharge Summary  Jacob [name] KGM:[id number] DOB: 1966/04/22 DOA: 01/31/2019  PCP: [name], MD  Admit date: 01/31/2019 Discharge date: 02/01/2019  Admitted From: home  Disposition:  home   Recommendations for Outpatient Follow-up:  1. Note- has narcotic seeking behavior  Discharge Condition:  stable   CODE STATUS:  Full code   Diet recommendation:  Heart healthy Consultations:  none    Discharge Diagnoses:  Principal Problem:   CAP (community acquired pneumonia)-  Acute respiratory failure with hypoxia  Active Problems:   Schizophrenia, chronic condition ([place])   Essential hypertension   Osteoarthritis of knee (Bilateral) (R>L)   Cocaine use     Brief Summary: Jacob [name] is a 53 y.o.malewith medical history significant ofschizophrenia, hypertension, glucose intolerance, narcotic abuse, cocaine abuse and osteoarthritis who presents with increasing left knee pain. He is found to be hypoxic and have bilateral pneumonia and thus admitted.  CTA > No evidence of significant pulmonary embolus. Patchy infiltrates in both lungs may represent atelectasis or pneumonia.   Hospital Course:  Principal Problem:   Acute respiratory failure with hypoxia   CAP (community acquired pneumonia)  Leukocytosis, Tachycardia- sepsis - he has bilateral pneumonia -  treated with Ceftriaxone and Azithromycin, nebs, O2 - Influenza and Urine strep negative - able to be weaned off of O2 yesterday-pulse ox 97 % on room air today - cough improving- no dyspnea when ambulating-    Active Problems: Nicotine abuse - he likely has chronic bronchitis as he states he has been coughing up large amounts of clear sputum for months now - advised to quit smoking- he needs PFTs as outpt   Cocaine abuse - complications from this discussed with patient- current pneumonia - last used > 1 wk ago - advised to stop using this    Osteoarthritis of knee (Bilateral) (L >  R) Narcotic seeking behavior  - he has been told that he needs a replacement and he is pursuing this - cont Suldinac BID which he has been on as outpt - added Voltaren gel- Knee was aspirated last week- it is slightly swollen now but does not appear to need aspiration again based on exam- he is a little tender in the upper part of his knee -  The RN, physical therapist and I note that he is trying to mislead [place] in regards to the severity of his knee pain- per RN, he has been ambulating back and forth in the room without much difficulty but when walking with PT, he holds his left leg in the air and states it is too painful to put down to walk- he has persistently been asking for more pain medications - he subsequently states that he has pain "all over his body" and needs increasing pain medications despite the fact that he appears to be resting comfortably     Schizophrenia, chronic condition   - cont Risperdal, Effexor, Cogentin, Ativan & Doxepin    Essential hypertension- uncontrolled -cont Lisinopril - as BP has been quite high -  added Norvasc 5 mg which has improved it to 154/103- will increase Norvasc to 10 mg- need further f/u of BP by PCP   Discharge Exam: Vitals:   02/01/19 0011 02/01/19 0746  BP:  (!) 154/103  Pulse:  (!) 110  Resp:    Temp:  97.8 F (36.6 C)  SpO2: 96% 91%   Vitals:   01/31/19 1853 01/31/19 2300 02/01/19 0011 02/01/19 0746  BP: (!) 153/90 (!) 172/111  Marland Kitchen)  154/103  Pulse:  98  (!) 110  Resp:  18    Temp:  97.9 F (36.6 C)  97.8 F (36.6 C)  TempSrc:  Oral  Oral  SpO2:  98% 96% 91%  Weight:      Height:        General: Pt is alert, awake, not in acute distress Cardiovascular: RRR, S1/S2 +, no rubs, no gallops Respiratory: CTA bilaterally, no wheezing, mild cough Abdominal: Soft, NT, ND, bowel sounds + Extremities:  no cyanosis- mild edema and tenderness of upper part of left knee and lower thigh   Discharge Instructions  Discharge  Instructions    Diet - low sodium heart healthy   Complete by:  As directed    Diet Carb Modified   Complete by:  As directed    Increase activity slowly   Complete by:  As directed      Allergies as of 02/01/2019      Reactions   Abilify [aripiprazole] Other (See Comments)   Per patient "black outs" not sure what happens      Medication List    TAKE these medications   albuterol 108 (90 Base) MCG/ACT inhaler Commonly known as:  PROVENTIL HFA;VENTOLIN HFA Inhale 1-2 puffs into the lungs every 6 (six) hours as needed for wheezing or shortness of breath.   amLODipine 10 MG tablet Commonly known as:  NORVASC Take 1 tablet (10 mg total) by mouth daily. Start taking on:  February 02, 2019   atorvastatin 10 MG tablet Commonly known as:  LIPITOR Take 10 mg by mouth daily.   azithromycin 250 MG tablet Commonly known as:  ZITHROMAX Take 2 tablets (500 mg total) by mouth daily.   benztropine 1 MG tablet Commonly known as:  COGENTIN Take 1 tablet (1 mg total) by mouth 2 (two) times daily. What changed:  when to take this   cefUROXime 500 MG tablet Commonly known as:  CEFTIN Take 1 tablet (500 mg total) by mouth 2 (two) times daily for 10 days.   Cetirizine HCl 10 MG Caps Commonly known as:  ZYRTEC ALLERGY Take 1 capsule (10 mg total) by mouth daily.   doxepin 10 MG capsule Commonly known as:  SINEQUAN Take 20 mg by mouth at bedtime.   fluticasone 50 MCG/ACT nasal spray Commonly known as:  FLONASE Place 2 sprays into both nostrils daily.   lisinopril 20 MG tablet Commonly known as:  PRINIVIL,ZESTRIL Take 20 mg by mouth daily.   LORazepam 1 MG tablet Commonly known as:  ATIVAN Take 1.5 mg by mouth daily.   metFORMIN 500 MG tablet Commonly known as:  GLUCOPHAGE Take 500 mg by mouth daily.   methocarbamol 500 MG tablet Commonly known as:  ROBAXIN Take 1 tablet (500 mg total) by mouth every 8 (eight) hours as needed for muscle spasms.   omeprazole 40 MG  capsule Commonly known as:  PRILOSEC Take 40 mg by mouth daily.   risperiDONE 2 MG tablet Commonly known as:  RISPERDAL Take 1 tablet (2 mg total) by mouth 2 (two) times daily.   sulindac 200 MG tablet Commonly known as:  CLINORIL Take 200 mg by mouth 2 (two) times daily.   venlafaxine XR 75 MG 24 hr capsule Commonly known as:  EFFEXOR-XR Take 225 mg by mouth daily with breakfast.       Allergies  Allergen Reactions  . Abilify [Aripiprazole] Other (See Comments)    Per patient "black outs" not sure what happens  Procedures/Studies:    Ct Angio Chest Pe W Or Wo Contrast  Result Date: 01/31/2019 CLINICAL DATA:  Rhonchi and shortness of breath. Positive D-dimer. EXAM: CT ANGIOGRAPHY CHEST WITH CONTRAST TECHNIQUE: Multidetector CT imaging of the chest was performed using the standard protocol during bolus administration of intravenous contrast. Multiplanar CT image reconstructions and MIPs were obtained to evaluate the vascular anatomy. CONTRAST:  164m ISOVUE-370 IOPAMIDOL (ISOVUE-370) INJECTION 76% COMPARISON:  None. FINDINGS: Cardiovascular: Moderately good opacification of the central and proximal segmental pulmonary arteries. No filling defects demonstrated suggesting no evidence of central pulmonary embolus. Peripheral vessels are not well opacified and can not be evaluated. Normal caliber thoracic aorta. No aortic dissection. Normal heart size. No pericardial effusions. Coronary artery calcifications. Mediastinum/Nodes: Esophagus is mostly decompressed. No significant lymphadenopathy in the chest. Lungs/Pleura: Motion artifact limits examination. Patchy infiltrates in both lungs most prominent in the bases. This could represent atelectasis or pneumonia. No pleural effusions. No pneumothorax. Airways are patent. Upper Abdomen: No acute changes identified. Musculoskeletal: Degenerative changes in the spine. Anterior compression of T12 without change since prior study. Review of the  MIP images confirms the above findings. IMPRESSION: No evidence of significant pulmonary embolus. Patchy infiltrates in both lungs may represent atelectasis or pneumonia. Electronically Signed   By: WLucienne CapersM.D.   On: 01/31/2019 02:20   Dg Chest Portable 1 View  Result Date: 01/31/2019 CLINICAL DATA:  Shortness of breath EXAM: PORTABLE CHEST 1 VIEW COMPARISON:  10/13/2018 FINDINGS: The heart size and mediastinal contours are within normal limits. Both lungs are clear. The visualized skeletal structures are unremarkable. IMPRESSION: No active disease. Electronically Signed   By: HKathreen Devoid  On: 01/31/2019 00:58   Dg Knee Left Port  Result Date: 01/31/2019 CLINICAL DATA:  Left knee pain EXAM: PORTABLE LEFT KNEE - 1-2 VIEW COMPARISON:  07/15/2016 FINDINGS: No acute fracture or dislocation. Generalized osteopenia. Severe lateral femorotibial compartment joint space narrowing. Mild medial femorotibial compartment joint space narrowing. Severe patellofemoral compartment joint space narrowing. Large joint effusion. Loose body in the suprapatellar joint space. Loose body in the posteromedial joint space. IMPRESSION: 1.  No acute osseous injury of the left knee. 2. Severe tricompartmental osteoarthritis of the left knee. Electronically Signed   By: HKathreen Devoid  On: 01/31/2019 00:59     The results of significant diagnostics from this hospitalization (including imaging, microbiology, ancillary and laboratory) are listed below for reference.     Microbiology: No results found for this or any previous visit (from the past 240 hour(s)).   Labs: BNP (last 3 results) Recent Labs    01/31/19 0055  BNP 250.3  Basic Metabolic Panel: Recent Labs  Lab 01/31/19 0055 02/01/19 0244  NA 135 141  K 3.8 4.2  CL 99 101  CO2 25 27  GLUCOSE 168* 163*  BUN 14 9  CREATININE 0.91 0.87  CALCIUM 8.7* 9.2   Liver Function Tests: Recent Labs  Lab 01/31/19 0055  AST 33  ALT 33  ALKPHOS 78   BILITOT 0.4  PROT 6.6  ALBUMIN 3.4*   No results for input(s): LIPASE, AMYLASE in the last 168 hours. No results for input(s): AMMONIA in the last 168 hours. CBC: Recent Labs  Lab 01/31/19 0055 02/01/19 0244  WBC 16.1* 15.3*  HGB 13.0 12.6*  HCT 40.5 40.4  MCV 86.4 86.3  PLT 392 377   Cardiac Enzymes: No results for input(s): CKTOTAL, CKMB, CKMBINDEX, TROPONINI in the last 168 hours. BNP: Invalid input(s):  POCBNP CBG: No results for input(s): GLUCAP in the last 168 hours. D-Dimer Recent Labs    01/31/19 0055  DDIMER 0.80*   Hgb A1c No results for input(s): HGBA1C in the last 72 hours. Lipid Profile No results for input(s): CHOL, HDL, LDLCALC, TRIG, CHOLHDL, LDLDIRECT in the last 72 hours. Thyroid function studies No results for input(s): TSH, T4TOTAL, T3FREE, THYROIDAB in the last 72 hours.  Invalid input(s): FREET3 Anemia work up No results for input(s): VITAMINB12, FOLATE, FERRITIN, TIBC, IRON, RETICCTPCT in the last 72 hours. Urinalysis    Component Value Date/Time   COLORURINE YELLOW 10/13/2018 [place] 10/13/2018 0447   LABSPEC 1.010 10/13/2018 0447   PHURINE 8.0 10/13/2018 0447   GLUCOSEU NEGATIVE 10/13/2018 0447   HGBUR SMALL (A) 10/13/2018 0447   BILIRUBINUR NEGATIVE 10/13/2018 0447   KETONESUR 5 (A) 10/13/2018 0447   PROTEINUR NEGATIVE 10/13/2018 0447   UROBILINOGEN 0.2 01/10/2015 1956   NITRITE NEGATIVE 10/13/2018 0447   LEUKOCYTESUR NEGATIVE 10/13/2018 0447   Sepsis Labs Invalid input(s): PROCALCITONIN,  WBC,  LACTICIDVEN Microbiology No results found for this or any previous visit (from the past 240 hour(s)).    ADDENDUM: I was asked to review this chart to determine observation vs inpatient status. I feel that with his hypoxia on presentation and bilateral pneumonia, he was not expected to improve as quickly as he has. He was able to be weaned off of O2 today which is surprising considering he has an extensive pneumonia. I  would consider keeping him in the hospital for 1-2 more days to recuperate on IV antibiotics however, he is asking to be discharged today. As he is no longer requiring O2, I feel it is safe to discharge him with assurance that he will finish the course of antibiotics and f/u with his PCP should he not improve. In this case, despite and early discharge, when considering he had hypoxia and sepsis in relation to bilateral pneumonia, I do still feel he has met criteria for inpatient status.   Time coordinating discharge in minutes: 65  SIGNED:   Debbe Odea, MD  [facility] 02/01/2019, 2:58 PM Pager   If 7PM-7AM, please contact night-coverage [email or web address] Password [email or web address]

## 2019-02-01 NOTE — Progress Notes (Signed)
Pt has been walking around the room during the day yesterday as well as last night and today. He walked to the bathroom to turn on the hot water in the shower because he had congestion and was able to walk back with no need for assistance or oxygen. When PT has come his ability to walk has significantly decreased as he said he was barely able to make it to the door. When I asked how physical therapy went he said the physical therapist said he needed more pain medicine which the therapist denied saying. Patient sats at 10 on RA while at rest.
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
## 2019-02-03 ENCOUNTER — Emergency Department (HOSPITAL_COMMUNITY): Payer: Medicare Other

## 2019-02-03 ENCOUNTER — Encounter (HOSPITAL_COMMUNITY): Payer: Self-pay | Admitting: Emergency Medicine

## 2019-02-03 ENCOUNTER — Emergency Department (HOSPITAL_COMMUNITY)
Admission: EM | Admit: 2019-02-03 | Discharge: 2019-02-03 | Disposition: A | Discharge status: Home / Self Care | Payer: Medicare Other | Attending: Emergency Medicine | Admitting: Emergency Medicine

## 2019-02-03 DIAGNOSIS — R000 Tachycardia, unspecified: Secondary | ICD-10-CM | POA: Diagnosis not present

## 2019-02-03 DIAGNOSIS — J189 Pneumonia, unspecified organism: Secondary | ICD-10-CM | POA: Diagnosis not present

## 2019-02-03 DIAGNOSIS — Z79899 Other long term (current) drug therapy: Secondary | ICD-10-CM | POA: Diagnosis not present

## 2019-02-03 DIAGNOSIS — I10 Essential (primary) hypertension: Secondary | ICD-10-CM | POA: Insufficient documentation

## 2019-02-03 DIAGNOSIS — R079 Chest pain, unspecified: Secondary | ICD-10-CM | POA: Diagnosis not present

## 2019-02-03 DIAGNOSIS — F17210 Nicotine dependence, cigarettes, uncomplicated: Secondary | ICD-10-CM | POA: Insufficient documentation

## 2019-02-03 DIAGNOSIS — R0689 Other abnormalities of breathing: Secondary | ICD-10-CM | POA: Diagnosis not present

## 2019-02-03 DIAGNOSIS — Z8673 Personal history of transient ischemic attack (TIA), and cerebral infarction without residual deficits: Secondary | ICD-10-CM | POA: Insufficient documentation

## 2019-02-03 DIAGNOSIS — E119 Type 2 diabetes mellitus without complications: Secondary | ICD-10-CM | POA: Diagnosis not present

## 2019-02-03 DIAGNOSIS — R062 Wheezing: Secondary | ICD-10-CM | POA: Diagnosis not present

## 2019-02-03 DIAGNOSIS — R0602 Shortness of breath: Secondary | ICD-10-CM

## 2019-02-03 LAB — CBC WITH DIFFERENTIAL/PLATELET
Abs Immature Granulocytes: 0.11 10*3/uL — ABNORMAL HIGH (ref 0.00–0.07)
Basophils Absolute: 0.1 10*3/uL (ref 0.0–0.1)
Basophils Relative: 1 %
Eosinophils Absolute: 0.1 10*3/uL (ref 0.0–0.5)
Eosinophils Relative: 1 %
HCT: 41.4 % (ref 39.0–52.0)
Hemoglobin: 12.5 g/dL — ABNORMAL LOW (ref 13.0–17.0)
Immature Granulocytes: 1 %
Lymphocytes Relative: 33 %
Lymphs Abs: 4.6 10*3/uL — ABNORMAL HIGH (ref 0.7–4.0)
MCH: 26.9 pg (ref 26.0–34.0)
MCHC: 30.2 g/dL (ref 30.0–36.0)
MCV: 89.2 fL (ref 80.0–100.0)
Monocytes Absolute: 0.8 10*3/uL (ref 0.1–1.0)
Monocytes Relative: 6 %
Neutro Abs: 8.2 10*3/uL — ABNORMAL HIGH (ref 1.7–7.7)
Neutrophils Relative %: 58 %
Platelets: 364 10*3/uL (ref 150–400)
RBC: 4.64 MIL/uL (ref 4.22–5.81)
RDW: 13.8 % (ref 11.5–15.5)
WBC: 13.9 10*3/uL — ABNORMAL HIGH (ref 4.0–10.5)
nRBC: 0 % (ref 0.0–0.2)

## 2019-02-03 LAB — COMPREHENSIVE METABOLIC PANEL
ALT: 71 U/L — ABNORMAL HIGH (ref 0–44)
AST: 38 U/L (ref 15–41)
Albumin: 3.4 g/dL — ABNORMAL LOW (ref 3.5–5.0)
Alkaline Phosphatase: 74 U/L (ref 38–126)
Anion gap: 10 (ref 5–15)
BUN: 13 mg/dL (ref 6–20)
CO2: 22 mmol/L (ref 22–32)
Calcium: 8.5 mg/dL — ABNORMAL LOW (ref 8.9–10.3)
Chloride: 106 mmol/L (ref 98–111)
Creatinine, Ser: 0.99 mg/dL (ref 0.61–1.24)
GFR calc Af Amer: >60 mL/min (ref >60)
GFR calc non Af Amer: >60 mL/min (ref >60)
Glucose, Bld: 188 mg/dL — ABNORMAL HIGH (ref 70–99)
Potassium: 3.9 mmol/L (ref 3.5–5.1)
Sodium: 138 mmol/L (ref 135–145)
Total Bilirubin: 0.3 mg/dL (ref 0.3–1.2)
Total Protein: 6.2 g/dL — ABNORMAL LOW (ref 6.5–8.1)

## 2019-02-03 LAB — LACTIC ACID, PLASMA: Lactic Acid, Venous: 1.5 mmol/L (ref 0.5–1.9)

## 2019-02-03 MED ORDER — AZITHROMYCIN 250 MG PO TABS
500.0000 mg | ORAL_TABLET | Freq: Once | ORAL | Status: AC
  Administered 2019-02-03: 500 mg via ORAL
  Filled 2019-02-03: qty 2

## 2019-02-03 MED ORDER — HYDROCODONE-ACETAMINOPHEN 5-325 MG PO TABS: 1.0000 | ORAL_TABLET | ORAL | 0 refills | Status: DC | PRN

## 2019-02-03 MED ORDER — SODIUM CHLORIDE 0.9 % IV SOLN
1.0000 g | Freq: Once | INTRAVENOUS | Status: AC
  Administered 2019-02-03: 1 g via INTRAVENOUS
  Filled 2019-02-03: qty 10

## 2019-02-03 MED ORDER — ACETAMINOPHEN 500 MG PO TABS
1000.0000 mg | ORAL_TABLET | Freq: Once | ORAL | Status: AC
  Administered 2019-02-03: 1000 mg via ORAL
  Filled 2019-02-03: qty 2

## 2019-02-03 MED ORDER — SODIUM CHLORIDE 0.9 % IV BOLUS
500.0000 mL | Freq: Once | INTRAVENOUS | Status: AC
  Administered 2019-02-03: 500 mL via INTRAVENOUS

## 2019-02-03 MED ORDER — CYCLOBENZAPRINE HCL 10 MG PO TABS
5.0000 mg | ORAL_TABLET | Freq: Once | ORAL | Status: AC
  Administered 2019-02-03: 5 mg via ORAL
  Filled 2019-02-03: qty 1

## 2019-02-03 MED ORDER — HYDROCODONE-ACETAMINOPHEN 5-325 MG PO TABS
1.0000 | ORAL_TABLET | Freq: Once | ORAL | Status: AC
  Administered 2019-02-03: 1 via ORAL
  Filled 2019-02-03: qty 1
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
## 2019-02-03 NOTE — ED Triage Notes (Signed)
Brought by ems from home for c/o SOB.  Was seen 2 days ago here.  Diagnosed with pneumonia.  Was unable to get prescriptions filled. Non compliant with all medications.  Received solumedrol 125mg  IV, albuterol 10 mg neb en route.

## 2019-02-03 NOTE — Discharge Planning (Signed)
[facility] consulted in regards to medication assistance.  Pt has insurance coverage and is not eligible for medication assistance programs.

## 2019-02-03 NOTE — ED Notes (Signed)
Breakfast Tray ordered  

## 2019-02-03 NOTE — Progress Notes (Signed)
CSW acknowledges consult for medication assistance. For further assistance with this matter please consult [place]. At this time there are no further CSW needs warranted. CSW will sign off.       Virgie Dad. Haeleigh Streiff, MSW, [place] Emergency Department Clinical Social Worker [phone]

## 2019-02-03 NOTE — Discharge Instructions (Addendum)
Please take your antibiotics to help with your pneumonia he was discharged for.  Please follow-up with your PCP and your orthopedics team.  If any symptoms change or worsen, please return to the nearest emergency department.  Please stay hydrated.

## 2019-02-03 NOTE — ED Provider Notes (Signed)
7:58 AM Care assumed from Dr. Leonette Monarch.  At time of transfer care, patient is awaiting evaluation by social work and care coordination team to determine best way for the patient to receive antibiotics for his pneumonia he was recently discharged for.  Patient is reportedly had a reassuring exam the emergency department and received azithromycin and Rocephin here for pneumonia.  Anticipate discharge once his antibiotic course is sorted out.  8:57 AM Care coordination spoke with patient and determined that his prescriptions should only be $3 to fill.  There is nothing else he will be able to provide.  Patient was given pain medicine and he was feeling much better.  Patient given prescription for pain medication and is going to follow with PCP as well as follow-up with orthopedics team.  Patient was able to eat and drink well difficulty and was feeling much better.  Patient still tachycardic and suspect his pain related.  Recent work-up several days ago revealed no pulmonary embolism.  Patient will be given prescription for pain medication and will fill his antibiotic prescriptions.  He was discharged in good condition.    Clinical Impression: 1. Community acquired pneumonia, unspecified laterality   2. SOB (shortness of breath)     Disposition: Discharge  Condition: Good  I have discussed the results, Dx and Tx plan with the pt(& family if present). He/she/they expressed understanding and agree(s) with the plan. Discharge instructions discussed at great length. Strict return precautions discussed and pt &/or family have verbalized understanding of the instructions. No further questions at time of discharge.    New Prescriptions   HYDROCODONE-ACETAMINOPHEN (NORCO/VICODIN) 5-325 MG TABLET    Take 1 tablet by mouth every 4 (four) hours as needed.    Follow Up: [name], MD [address] [place] [address] [place] EMERGENCY  DEPARTMENT [address] [id number] mc [place]           Jessicca Stitzer, Gwenyth Allegra, MD 02/03/19 [phone]

## 2019-02-05 LAB — CULTURE, BLOOD (ROUTINE X 2)
Culture: NO GROWTH
Culture: NO GROWTH
Special Requests: ADEQUATE
Special Requests: ADEQUATE

## 2019-02-06 ENCOUNTER — Ambulatory Visit (INDEPENDENT_AMBULATORY_CARE_PROVIDER_SITE_OTHER): Payer: Medicare Other

## 2019-02-06 ENCOUNTER — Ambulatory Visit (INDEPENDENT_AMBULATORY_CARE_PROVIDER_SITE_OTHER): Payer: Self-pay

## 2019-02-06 ENCOUNTER — Encounter (INDEPENDENT_AMBULATORY_CARE_PROVIDER_SITE_OTHER): Payer: Self-pay | Admitting: Physician Assistant

## 2019-02-06 ENCOUNTER — Ambulatory Visit (INDEPENDENT_AMBULATORY_CARE_PROVIDER_SITE_OTHER): Payer: Medicare Other | Admitting: Orthopedic Surgery

## 2019-02-06 VITALS — Ht 74.0 in | Wt 237.0 lb

## 2019-02-06 DIAGNOSIS — M170 Bilateral primary osteoarthritis of knee: Secondary | ICD-10-CM | POA: Diagnosis not present

## 2019-02-06 DIAGNOSIS — M1712 Unilateral primary osteoarthritis, left knee: Secondary | ICD-10-CM

## 2019-02-06 DIAGNOSIS — M25562 Pain in left knee: Secondary | ICD-10-CM
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
## 2019-02-11 ENCOUNTER — Other Ambulatory Visit: Payer: Self-pay

## 2019-02-11 ENCOUNTER — Emergency Department (HOSPITAL_COMMUNITY): Payer: Medicare Other

## 2019-02-11 ENCOUNTER — Emergency Department (HOSPITAL_COMMUNITY)
Admission: EM | Admit: 2019-02-11 | Discharge: 2019-02-11 | Disposition: A | Discharge status: Home / Self Care | Payer: Medicare Other | Attending: Emergency Medicine | Admitting: Emergency Medicine

## 2019-02-11 ENCOUNTER — Encounter (HOSPITAL_COMMUNITY): Payer: Self-pay

## 2019-02-11 DIAGNOSIS — Z79899 Other long term (current) drug therapy: Secondary | ICD-10-CM | POA: Insufficient documentation

## 2019-02-11 DIAGNOSIS — Z7984 Long term (current) use of oral hypoglycemic drugs: Secondary | ICD-10-CM | POA: Insufficient documentation

## 2019-02-11 DIAGNOSIS — F17210 Nicotine dependence, cigarettes, uncomplicated: Secondary | ICD-10-CM | POA: Insufficient documentation

## 2019-02-11 DIAGNOSIS — B9789 Other viral agents as the cause of diseases classified elsewhere: Secondary | ICD-10-CM | POA: Diagnosis not present

## 2019-02-11 DIAGNOSIS — J069 Acute upper respiratory infection, unspecified: Secondary | ICD-10-CM | POA: Insufficient documentation

## 2019-02-11 DIAGNOSIS — E1165 Type 2 diabetes mellitus with hyperglycemia: Secondary | ICD-10-CM | POA: Diagnosis not present

## 2019-02-11 DIAGNOSIS — I10 Essential (primary) hypertension: Secondary | ICD-10-CM | POA: Insufficient documentation

## 2019-02-11 DIAGNOSIS — R55 Syncope and collapse: Secondary | ICD-10-CM

## 2019-02-11 DIAGNOSIS — R0602 Shortness of breath: Secondary | ICD-10-CM | POA: Diagnosis not present

## 2019-02-11 DIAGNOSIS — R42 Dizziness and giddiness: Secondary | ICD-10-CM | POA: Diagnosis not present

## 2019-02-11 DIAGNOSIS — R000 Tachycardia, unspecified: Secondary | ICD-10-CM | POA: Diagnosis not present

## 2019-02-11 DIAGNOSIS — S80919A Unspecified superficial injury of unspecified knee, initial encounter: Secondary | ICD-10-CM | POA: Diagnosis not present

## 2019-02-11 LAB — CBC WITH DIFFERENTIAL/PLATELET
Abs Immature Granulocytes: 0.34 10*3/uL — ABNORMAL HIGH (ref 0.00–0.07)
Basophils Absolute: 0.1 10*3/uL (ref 0.0–0.1)
Basophils Relative: 1 %
Eosinophils Absolute: 0.1 10*3/uL (ref 0.0–0.5)
Eosinophils Relative: 1 %
HCT: 40.8 % (ref 39.0–52.0)
Hemoglobin: 13 g/dL (ref 13.0–17.0)
Immature Granulocytes: 2 %
Lymphocytes Relative: 30 %
Lymphs Abs: 4.5 10*3/uL — ABNORMAL HIGH (ref 0.7–4.0)
MCH: 27.6 pg (ref 26.0–34.0)
MCHC: 31.9 g/dL (ref 30.0–36.0)
MCV: 86.6 fL (ref 80.0–100.0)
Monocytes Absolute: 1 10*3/uL (ref 0.1–1.0)
Monocytes Relative: 7 %
Neutro Abs: 8.9 10*3/uL — ABNORMAL HIGH (ref 1.7–7.7)
Neutrophils Relative %: 59 %
Platelets: 306 10*3/uL (ref 150–400)
RBC: 4.71 MIL/uL (ref 4.22–5.81)
RDW: 13.7 % (ref 11.5–15.5)
WBC: 14.9 10*3/uL — ABNORMAL HIGH (ref 4.0–10.5)
nRBC: 0 % (ref 0.0–0.2)

## 2019-02-11 LAB — COMPREHENSIVE METABOLIC PANEL
ALT: 36 U/L (ref 0–44)
AST: 21 U/L (ref 15–41)
Albumin: 3.7 g/dL (ref 3.5–5.0)
Alkaline Phosphatase: 92 U/L (ref 38–126)
Anion gap: 11 (ref 5–15)
BUN: 13 mg/dL (ref 6–20)
CO2: 21 mmol/L — ABNORMAL LOW (ref 22–32)
Calcium: 9.3 mg/dL (ref 8.9–10.3)
Chloride: 101 mmol/L (ref 98–111)
Creatinine, Ser: 1.07 mg/dL (ref 0.61–1.24)
GFR calc Af Amer: >60 mL/min (ref >60)
GFR calc non Af Amer: >60 mL/min (ref >60)
Glucose, Bld: 264 mg/dL — ABNORMAL HIGH (ref 70–99)
Potassium: 4.5 mmol/L (ref 3.5–5.1)
Sodium: 133 mmol/L — ABNORMAL LOW (ref 135–145)
Total Bilirubin: 1 mg/dL (ref 0.3–1.2)
Total Protein: 7 g/dL (ref 6.5–8.1)

## 2019-02-11 LAB — URINALYSIS, ROUTINE W REFLEX MICROSCOPIC
Bilirubin Urine: NEGATIVE
Glucose, UA: 150 mg/dL — AB
Hgb urine dipstick: NEGATIVE
Ketones, ur: 5 mg/dL — AB
Leukocytes,Ua: NEGATIVE
Nitrite: NEGATIVE
Protein, ur: NEGATIVE mg/dL
Specific Gravity, Urine: 1.02 (ref 1.005–1.030)
pH: 5 (ref 5.0–8.0)

## 2019-02-11 LAB — I-STAT TROPONIN, ED: Troponin i, poc: 0 ng/mL (ref 0.00–0.08)

## 2019-02-11 LAB — RAPID URINE DRUG SCREEN, HOSP PERFORMED
Amphetamines: NOT DETECTED
Barbiturates: NOT DETECTED
Benzodiazepines: NOT DETECTED
Cocaine: NOT DETECTED
Opiates: NOT DETECTED
Tetrahydrocannabinol: NOT DETECTED

## 2019-02-11 MED ORDER — IOPAMIDOL (ISOVUE-370) INJECTION 76%
INTRAVENOUS | Status: AC
  Filled 2019-02-11: qty 100

## 2019-02-11 MED ORDER — HYDROCODONE-ACETAMINOPHEN 5-325 MG PO TABS
1.0000 | ORAL_TABLET | Freq: Once | ORAL | Status: AC
  Administered 2019-02-11: 1 via ORAL
  Filled 2019-02-11: qty 1

## 2019-02-11 MED ORDER — KETOROLAC TROMETHAMINE 30 MG/ML IJ SOLN
30.0000 mg | Freq: Once | INTRAMUSCULAR | Status: AC
  Administered 2019-02-11: 30 mg via INTRAVENOUS
  Filled 2019-02-11: qty 1

## 2019-02-11 MED ORDER — SODIUM CHLORIDE 0.9 % IV BOLUS
1000.0000 mL | Freq: Once | INTRAVENOUS | Status: AC
  Administered 2019-02-11: 1000 mL via INTRAVENOUS

## 2019-02-11 MED ORDER — IOPAMIDOL (ISOVUE-370) INJECTION 76%
80.0000 mL | Freq: Once | INTRAVENOUS | Status: AC | PRN
  Administered 2019-02-11: 137 mL via INTRAVENOUS

## 2019-02-11 NOTE — Discharge Instructions (Addendum)
It was our pleasure to provide your ER care today - we hope that you feel better.  Your nasal congestion, cough, and body aches appear most consistent with a viral uri or flu-like illness.   Rest. Drink adequate fluids. Take zyrtec-d or claritin-d as need for congestion.  Take acetaminophen or ibuprofen as need for body aches.   Follow up with primary care doctor in the next few days for recheck.  Return to ER if worse, new symptoms, new or severe pain, increased trouble breathing, weak/fainting, other concern.   You were given pain medication in the ER - no driving for the next 4 hours.

## 2019-02-11 NOTE — ED Notes (Signed)
Patient verbalizes understanding of discharge instructions. Opportunity for questioning and answers were provided. Armband removed by staff, pt discharged from ED. Pt refused d/c vitals.
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
## 2019-02-11 NOTE — ED Notes (Signed)
Pt given coffee and coke per RN.

## 2019-02-11 NOTE — ED Notes (Signed)
Pt given coffee per RN.

## 2019-02-11 NOTE — ED Notes (Signed)
Called CT, pt is next in line.

## 2019-02-11 NOTE — ED Provider Notes (Addendum)
Signed out by Dr Tyrone Nine that ED workup complete except for CTA chest, and that if/when neg to d/c to home.  CTA neg for PE.   Pt alert, content, nad.   Afebrile. Breathing comfortably. No current pain or discomfort. Hr 98, rr 16. Pulse ox 95%. abd soft nt. No headache. No neck pain or stiffness/rigidity. abd soft nt. No scrotal or testicular pain or swelling. Skin warm, dry, no rash, no cellulitis.   Pt does note recent uri symptoms for past couple of days with nasal congestion, rhinorrhea, occasional non prod cough, general body aches - symptoms felt most c/w viral syndrome.   Pt requests d/c. States ready to go. He does request rx or recommendation for nasal congestion. rec claritind/zyrtecd.   rec close pcp f/u.  Return precautions provided.          Lajean Saver, MD 02/11/19 [phone]

## 2019-02-11 NOTE — ED Triage Notes (Signed)
[facility] reports pt reported syncopal episode. Pt has been tachy 120-130. Soon to have a total LK replacement. 153/84. Hx of bells palsy

## 2019-02-19 ENCOUNTER — Other Ambulatory Visit (INDEPENDENT_AMBULATORY_CARE_PROVIDER_SITE_OTHER): Payer: Self-pay | Admitting: Orthopedic Surgery

## 2019-02-19 NOTE — Telephone Encounter (Signed)
Pt was in office 02/06/2019 needs to have a total knee replacement do you want to refill this medicine.

## 2019-03-04 DIAGNOSIS — E1165 Type 2 diabetes mellitus with hyperglycemia: Secondary | ICD-10-CM | POA: Diagnosis not present

## 2019-03-04 DIAGNOSIS — R0602 Shortness of breath: Secondary | ICD-10-CM | POA: Diagnosis not present

## 2019-03-04 DIAGNOSIS — E669 Obesity, unspecified: Secondary | ICD-10-CM | POA: Diagnosis not present

## 2019-03-04 DIAGNOSIS — R05 Cough: Secondary | ICD-10-CM | POA: Diagnosis not present

## 2019-03-04 DIAGNOSIS — Z720 Tobacco use: Secondary | ICD-10-CM | POA: Diagnosis not present

## 2019-03-04 DIAGNOSIS — I10 Essential (primary) hypertension: Secondary | ICD-10-CM | POA: Diagnosis not present

## 2019-03-04 DIAGNOSIS — E782 Mixed hyperlipidemia: Secondary | ICD-10-CM | POA: Diagnosis not present

## 2019-03-17 ENCOUNTER — Telehealth (INDEPENDENT_AMBULATORY_CARE_PROVIDER_SITE_OTHER): Payer: Self-pay | Admitting: Radiology

## 2019-03-17 ENCOUNTER — Ambulatory Visit (INDEPENDENT_AMBULATORY_CARE_PROVIDER_SITE_OTHER): Payer: Medicare Other | Admitting: Orthopedic Surgery

## 2019-03-17 NOTE — Telephone Encounter (Signed)
I called patient in regards to appointment tomorrow for bilateral knee pain. I expressed to patient that we were going to reschedule his appointment due to COVID-19, most especially because he has had recent respiratory issues, per Shawn Rayburn PA-C. I tried to explain to patient that we were doing this for his protection, as well as others. I also explained, per [facility], he would not receive cortisone injection at this time. He states that he does not care about any of that and he is in extreme pain and needs to be seen. If he is not seen, he would at least like to get some pain medication.    Please address with patient first thing.

## 2019-03-18 ENCOUNTER — Other Ambulatory Visit (INDEPENDENT_AMBULATORY_CARE_PROVIDER_SITE_OTHER): Payer: Self-pay | Admitting: Physician Assistant

## 2019-03-18 ENCOUNTER — Ambulatory Visit (INDEPENDENT_AMBULATORY_CARE_PROVIDER_SITE_OTHER): Payer: Medicare Other | Admitting: Physician Assistant

## 2019-03-18 DIAGNOSIS — M170 Bilateral primary osteoarthritis of knee: Secondary | ICD-10-CM

## 2019-03-18 MED ORDER — TRAMADOL HCL 50 MG PO TABS: 50.0000 mg | ORAL_TABLET | Freq: Four times a day (QID) | ORAL | 0 refills | Status: DC | PRN

## 2019-03-18 NOTE — Progress Notes (Signed)
Patient unable to be seen for severe bilateral knee OA due to Covid 19 restrictions. Will order Ultram 50 mg po q6 hours prn for pain management.

## 2019-03-18 NOTE — Telephone Encounter (Signed)
I spoke with pt this morning and he will r/s his appt to next month. PA advised that she will call in an rx for tramadol today to the pharmacy.

## 2019-03-19 DIAGNOSIS — M170 Bilateral primary osteoarthritis of knee: Secondary | ICD-10-CM | POA: Diagnosis not present

## 2019-03-28 ENCOUNTER — Emergency Department (HOSPITAL_COMMUNITY)
Admission: EM | Admit: 2019-03-28 | Discharge: 2019-03-29 | Disposition: A | Discharge status: Home / Self Care | Payer: Medicare Other | Attending: Emergency Medicine | Admitting: Emergency Medicine

## 2019-03-28 ENCOUNTER — Encounter (HOSPITAL_COMMUNITY): Payer: Self-pay | Admitting: Emergency Medicine

## 2019-03-28 ENCOUNTER — Other Ambulatory Visit: Payer: Self-pay

## 2019-03-28 DIAGNOSIS — F17210 Nicotine dependence, cigarettes, uncomplicated: Secondary | ICD-10-CM | POA: Diagnosis not present

## 2019-03-28 DIAGNOSIS — F1490 Cocaine use, unspecified, uncomplicated: Secondary | ICD-10-CM | POA: Insufficient documentation

## 2019-03-28 DIAGNOSIS — F1290 Cannabis use, unspecified, uncomplicated: Secondary | ICD-10-CM | POA: Insufficient documentation

## 2019-03-28 DIAGNOSIS — F200 Paranoid schizophrenia: Secondary | ICD-10-CM

## 2019-03-28 DIAGNOSIS — R000 Tachycardia, unspecified: Secondary | ICD-10-CM | POA: Diagnosis not present

## 2019-03-28 DIAGNOSIS — F1910 Other psychoactive substance abuse, uncomplicated: Secondary | ICD-10-CM | POA: Diagnosis not present

## 2019-03-28 DIAGNOSIS — Z046 Encounter for general psychiatric examination, requested by authority: Secondary | ICD-10-CM | POA: Diagnosis present

## 2019-03-28 DIAGNOSIS — I10 Essential (primary) hypertension: Secondary | ICD-10-CM | POA: Diagnosis not present

## 2019-03-28 DIAGNOSIS — G8929 Other chronic pain: Secondary | ICD-10-CM | POA: Diagnosis not present

## 2019-03-28 DIAGNOSIS — Z79899 Other long term (current) drug therapy: Secondary | ICD-10-CM | POA: Insufficient documentation

## 2019-03-28 LAB — SALICYLATE LEVEL: Salicylate Lvl: <7 mg/dL (ref 2.8–30.0)

## 2019-03-28 LAB — COMPREHENSIVE METABOLIC PANEL
ALT: 31 U/L (ref 0–44)
AST: 29 U/L (ref 15–41)
Albumin: 3.7 g/dL (ref 3.5–5.0)
Alkaline Phosphatase: 87 U/L (ref 38–126)
Anion gap: 12 (ref 5–15)
BUN: 9 mg/dL (ref 6–20)
CO2: 19 mmol/L — ABNORMAL LOW (ref 22–32)
Calcium: 9.1 mg/dL (ref 8.9–10.3)
Chloride: 105 mmol/L (ref 98–111)
Creatinine, Ser: 0.9 mg/dL (ref 0.61–1.24)
GFR calc Af Amer: >60 mL/min (ref >60)
GFR calc non Af Amer: >60 mL/min (ref >60)
Glucose, Bld: 108 mg/dL — ABNORMAL HIGH (ref 70–99)
Potassium: 4.1 mmol/L (ref 3.5–5.1)
Sodium: 136 mmol/L (ref 135–145)
Total Bilirubin: 0.6 mg/dL (ref 0.3–1.2)
Total Protein: 6.8 g/dL (ref 6.5–8.1)

## 2019-03-28 LAB — CBC
HCT: 38.6 % — ABNORMAL LOW (ref 39.0–52.0)
Hemoglobin: 12.2 g/dL — ABNORMAL LOW (ref 13.0–17.0)
MCH: 27.5 pg (ref 26.0–34.0)
MCHC: 31.6 g/dL (ref 30.0–36.0)
MCV: 86.9 fL (ref 80.0–100.0)
Platelets: 302 10*3/uL (ref 150–400)
RBC: 4.44 MIL/uL (ref 4.22–5.81)
RDW: 13.7 % (ref 11.5–15.5)
WBC: 10.9 10*3/uL — ABNORMAL HIGH (ref 4.0–10.5)
nRBC: 0 % (ref 0.0–0.2)

## 2019-03-28 LAB — RAPID URINE DRUG SCREEN, HOSP PERFORMED
Amphetamines: NOT DETECTED
Barbiturates: NOT DETECTED
Benzodiazepines: POSITIVE — AB
Cocaine: POSITIVE — AB
Opiates: NOT DETECTED
Tetrahydrocannabinol: NOT DETECTED

## 2019-03-28 LAB — ACETAMINOPHEN LEVEL: Acetaminophen (Tylenol), Serum: <10 ug/mL — ABNORMAL LOW (ref 10–30)

## 2019-03-28 LAB — ETHANOL: Alcohol, Ethyl (B): 89 mg/dL — ABNORMAL HIGH (ref <10)

## 2019-03-28 NOTE — ED Triage Notes (Signed)
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 Pt uncooperative and unwilling to answer questions. Superficial lacerations noted to his chest, pt states they are from his dogs, because "they were happy to see me".

## 2019-03-29 ENCOUNTER — Other Ambulatory Visit: Payer: Self-pay

## 2019-03-29 DIAGNOSIS — F200 Paranoid schizophrenia: Secondary | ICD-10-CM | POA: Diagnosis not present

## 2019-03-29 LAB — URINALYSIS, ROUTINE W REFLEX MICROSCOPIC
Bacteria, UA: NONE SEEN
Bilirubin Urine: NEGATIVE
Glucose, UA: NEGATIVE mg/dL
Ketones, ur: NEGATIVE mg/dL
Leukocytes,Ua: NEGATIVE
Nitrite: NEGATIVE
Protein, ur: NEGATIVE mg/dL
Specific Gravity, Urine: 1.004 — ABNORMAL LOW (ref 1.005–1.030)
pH: 5 (ref 5.0–8.0)

## 2019-03-29 LAB — CBG MONITORING, ED
Glucose-Capillary: 93 mg/dL (ref 70–99)
Glucose-Capillary: 119 mg/dL — ABNORMAL HIGH (ref 70–99)

## 2019-03-29 MED ORDER — ATORVASTATIN CALCIUM 10 MG PO TABS
10.0000 mg | ORAL_TABLET | Freq: Every day | ORAL | Status: DC
  Administered 2019-03-29: 10 mg via ORAL
  Filled 2019-03-29: qty 1

## 2019-03-29 MED ORDER — PANTOPRAZOLE SODIUM 40 MG PO TBEC
40.0000 mg | DELAYED_RELEASE_TABLET | Freq: Every day | ORAL | Status: DC
  Administered 2019-03-29: 40 mg via ORAL
  Filled 2019-03-29: qty 1

## 2019-03-29 MED ORDER — LORAZEPAM 1 MG PO TABS: 0.0000 mg | ORAL_TABLET | Freq: Two times a day (BID) | ORAL | Status: DC

## 2019-03-29 MED ORDER — LISINOPRIL 20 MG PO TABS
20.0000 mg | ORAL_TABLET | Freq: Every day | ORAL | Status: DC
  Administered 2019-03-29: 20 mg via ORAL
  Filled 2019-03-29: qty 1

## 2019-03-29 MED ORDER — SULINDAC 200 MG PO TABS
200.0000 mg | ORAL_TABLET | Freq: Two times a day (BID) | ORAL | Status: DC
  Administered 2019-03-29 (×2): 200 mg via ORAL
  Filled 2019-03-29 (×2): qty 1

## 2019-03-29 MED ORDER — VENLAFAXINE HCL ER 75 MG PO CP24
225.0000 mg | ORAL_CAPSULE | Freq: Every day | ORAL | Status: DC
  Administered 2019-03-29: 225 mg via ORAL
  Filled 2019-03-29: qty 1

## 2019-03-29 MED ORDER — FLUTICASONE PROPIONATE 50 MCG/ACT NA SUSP
2.0000 | Freq: Every day | NASAL | Status: DC
  Filled 2019-03-29: qty 16

## 2019-03-29 MED ORDER — IBUPROFEN 800 MG PO TABS
800.0000 mg | ORAL_TABLET | Freq: Three times a day (TID) | ORAL | Status: DC | PRN
  Administered 2019-03-29: 800 mg via ORAL
  Filled 2019-03-29: qty 1

## 2019-03-29 MED ORDER — VITAMIN B-1 100 MG PO TABS
100.0000 mg | ORAL_TABLET | Freq: Every day | ORAL | Status: DC
  Administered 2019-03-29: 100 mg via ORAL
  Filled 2019-03-29: qty 1

## 2019-03-29 MED ORDER — LORAZEPAM 1 MG PO TABS: 0.0000 mg | ORAL_TABLET | Freq: Four times a day (QID) | ORAL | Status: DC

## 2019-03-29 MED ORDER — BENZTROPINE MESYLATE 1 MG PO TABS
1.0000 mg | ORAL_TABLET | Freq: Every day | ORAL | Status: DC
  Administered 2019-03-29: 1 mg via ORAL
  Filled 2019-03-29: qty 1

## 2019-03-29 MED ORDER — RISPERIDONE 1 MG PO TABS
2.0000 mg | ORAL_TABLET | Freq: Two times a day (BID) | ORAL | Status: DC
  Administered 2019-03-29 (×2): 2 mg via ORAL
  Filled 2019-03-29 (×2): qty 1

## 2019-03-29 MED ORDER — METFORMIN HCL 500 MG PO TABS
500.0000 mg | ORAL_TABLET | Freq: Every day | ORAL | Status: DC
  Administered 2019-03-29: 500 mg via ORAL
  Filled 2019-03-29: qty 1

## 2019-03-29 MED ORDER — LORAZEPAM 1 MG PO TABS
1.5000 mg | ORAL_TABLET | Freq: Every day | ORAL | Status: DC
  Administered 2019-03-29: 1.5 mg via ORAL
  Filled 2019-03-29: qty 1

## 2019-03-29 MED ORDER — ZOLPIDEM TARTRATE 5 MG PO TABS: 5.0000 mg | ORAL_TABLET | Freq: Every evening | ORAL | Status: DC | PRN

## 2019-03-29 MED ORDER — INSULIN ASPART 100 UNIT/ML ~~LOC~~ SOLN: 0.0000 [IU] | Freq: Three times a day (TID) | SUBCUTANEOUS | Status: DC

## 2019-03-29 MED ORDER — AMLODIPINE BESYLATE 5 MG PO TABS
10.0000 mg | ORAL_TABLET | Freq: Every day | ORAL | Status: DC
  Administered 2019-03-29: 10 mg via ORAL
  Filled 2019-03-29: qty 2

## 2019-03-29 MED ORDER — NICOTINE 21 MG/24HR TD PT24: 21.0000 mg | MEDICATED_PATCH | Freq: Every day | TRANSDERMAL | Status: DC

## 2019-03-29 MED ORDER — LORATADINE 10 MG PO TABS
10.0000 mg | ORAL_TABLET | Freq: Every day | ORAL | Status: DC
  Administered 2019-03-29: 10 mg via ORAL
  Filled 2019-03-29: qty 1

## 2019-03-29 MED ORDER — ALBUTEROL SULFATE HFA 108 (90 BASE) MCG/ACT IN AERS: 1.0000 | INHALATION_SPRAY | Freq: Four times a day (QID) | RESPIRATORY_TRACT | Status: DC | PRN

## 2019-03-29 MED ORDER — THIAMINE HCL 100 MG/ML IJ SOLN: 100.0000 mg | Freq: Every day | INTRAMUSCULAR | Status: DC

## 2019-03-29 MED ORDER — ALUM & MAG HYDROXIDE-SIMETH 200-200-20 MG/5ML PO SUSP: 30.0000 mL | Freq: Four times a day (QID) | ORAL | Status: DC | PRN

## 2019-03-29 MED ORDER — DOXEPIN HCL 10 MG PO CAPS
20.0000 mg | ORAL_CAPSULE | Freq: Every day | ORAL | Status: DC
  Administered 2019-03-29: 20 mg via ORAL
  Filled 2019-03-29: qty 2

## 2019-03-29 MED ORDER — LORAZEPAM 2 MG/ML IJ SOLN: 0.0000 mg | Freq: Two times a day (BID) | INTRAMUSCULAR | Status: DC

## 2019-03-29 MED ORDER — LORAZEPAM 2 MG/ML IJ SOLN: 0.0000 mg | Freq: Four times a day (QID) | INTRAMUSCULAR | Status: DC

## 2019-03-29 NOTE — ED Notes (Signed)
Telepsych being performed. 
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
## 2019-03-29 NOTE — ED Notes (Signed)
IVC papers rescinded by Dr Wilson Singer - copy faxed to [facility], copy sent to Medical Records, and original placed in folder for [employer]. ALL belongings - 3 labeled belongings bags - returned to pt - Pt verified all items present. D/C paperwork discussed and given - pt voiced understanding.

## 2019-03-29 NOTE — ED Notes (Signed)
Lunch tray ordered 

## 2019-03-29 NOTE — Progress Notes (Addendum)
CSW contacted patient's father Jacob [name] [phone]) for collateral information as petitioner (patient's brother) could not be reached. Jacob [name] informed CSW that he had spoken with patient's brother earlier in the day and had been informed that they were having issues getting along. Jacob [name] stated that they just need to figure out how to live together. CSW inquired if Jacob [name] felt that patient was a harm to himself or to anyone else. Jacob [name] stated that he did not feel that he was that type of person. CSW thanked him for his time and asked that if he spoke to his son (patient's brother) to have him call us. No other concerns expressed. Contact ended without issue.  [name]. [name], MSW, [place] Work/Disposition Phone: [phone] Fax: [phone]

## 2019-03-29 NOTE — ED Provider Notes (Signed)
Emergency Medicine Observation Re-evaluation Note  Jacob [name] is a 53 y.o. male, seen on rounds today.  Pt initially presented to the ED for complaints of IVC Currently, the patient is awake, sitting up in bed, requests IBU for chronic body aches. States one leg is longer than the other- scheduled for knee surgery which will correct this but in the mean time his PCP prescribed #90 IBU monthly. No changes in his chronic body pain.  Physical Exam  BP 136/70 (BP Location: Left Arm)   Pulse 97   Temp 98.5 F (36.9 C) (Oral)   Resp 20   SpO2 95%  Physical Exam  Awake, alert, respirations even and unlabored. Speech clear, organized thought pattern.  ED Course / MDM  PET:KKOE   I have reviewed the labs performed to date as well as medications administered while in observation.  Recent changes in the last 24 hours include assessed by psych. Vitals WNL this morning. Plan  Current plan is for hold while awaiting contact with petitioner (brother) for additional information. Patient is under full IVC at this time.   Tacy Learn, PA-C 03/29/19 [id number]    Duffy Bruce, MD 03/29/19 [phone]

## 2019-03-29 NOTE — ED Notes (Signed)
Offered pt opportunity to shower and perform morning hygiene activities. Pt states that he will likely be going home sometime in the morning and would prefer to shower in his own home. Pt pleasant and cooperative.

## 2019-03-29 NOTE — [facility] Counselor (Addendum)
8:00- TTS left a HIPPA compliant voice mail for patient's brother, Jeral Pinch [phone], to obtain collateral information.  10:17- TTS attempted to contact brother a second time.
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
## 2019-03-29 NOTE — ED Notes (Signed)
Dinner tray ordered - house tray

## 2019-03-29 NOTE — ED Notes (Signed)
Pt ambulated to nurses' desk stating he can call his father to come pick him up when he receives d/c paperwork.

## 2019-03-29 NOTE — [facility] Counselor (Addendum)
TTS left a HIPPA compliant voicemail for patient's brother, Sherren Mocha, stating that since we could not get collateral stating otherwise, he does not meet in patient criteria and will be discharged by our provider.  1315: Consulted with Marvia Pickles, NP who recommends patient continues to be observed until collateral is obtained.

## 2019-03-29 NOTE — ED Notes (Addendum)
Advised by [facility] NP recommends to d/c pt. Pt aware by [facility] NP also. Pt drinking po fluids given as requested w/o difficulty.

## 2019-03-29 NOTE — ED Notes (Signed)
ORDERED BFAST TRAY  

## 2019-03-29 NOTE — ED Notes (Signed)
Ford from [facility] advised the previous RN that pt is cleared from a [place] stand point and ready for discharge however they need to speak to the petitioner, pt's brother, and he is not answering his phone and will not return their calls. Once they speak to him pt will be discharged. Dr. Betsey Holiday has not signed the First Exam because of this delay. Pt's status is currently pending the conversation with his brother.

## 2019-04-08 ENCOUNTER — Other Ambulatory Visit (INDEPENDENT_AMBULATORY_CARE_PROVIDER_SITE_OTHER): Payer: Self-pay | Admitting: Orthopedic Surgery

## 2019-04-08 ENCOUNTER — Telehealth (INDEPENDENT_AMBULATORY_CARE_PROVIDER_SITE_OTHER): Payer: Self-pay | Admitting: Orthopedic Surgery

## 2019-04-08 DIAGNOSIS — M170 Bilateral primary osteoarthritis of knee: Secondary | ICD-10-CM

## 2019-04-08 MED ORDER — TRAMADOL HCL 50 MG PO TABS: 50.0000 mg | ORAL_TABLET | Freq: Four times a day (QID) | ORAL | 0 refills | Status: DC | PRN

## 2019-04-08 NOTE — Telephone Encounter (Signed)
rx sent to [facility]

## 2019-04-08 NOTE — Telephone Encounter (Signed)
Patient called requesting an RX refill on his Tramadol.  Patient uses [facility] in [place].  WK#[phone].  Thank you.

## 2019-04-08 NOTE — Telephone Encounter (Signed)
Pt is requesting tramadol rx for bilat knee OA. Was recently in the ER tested positive for cocaine.

## 2019-04-08 NOTE — Telephone Encounter (Signed)
Called and lm on vm to advise of message below.  

## 2019-04-09 DIAGNOSIS — I10 Essential (primary) hypertension: Secondary | ICD-10-CM | POA: Diagnosis not present

## 2019-04-09 DIAGNOSIS — E1165 Type 2 diabetes mellitus with hyperglycemia: Secondary | ICD-10-CM | POA: Diagnosis not present

## 2019-04-09 DIAGNOSIS — G894 Chronic pain syndrome: Secondary | ICD-10-CM | POA: Diagnosis not present

## 2019-04-09 DIAGNOSIS — E669 Obesity, unspecified: Secondary | ICD-10-CM | POA: Diagnosis not present

## 2019-04-09 DIAGNOSIS — E782 Mixed hyperlipidemia: Secondary | ICD-10-CM | POA: Diagnosis not present

## 2019-04-09 DIAGNOSIS — R0602 Shortness of breath: Secondary | ICD-10-CM | POA: Diagnosis not present

## 2019-04-09 DIAGNOSIS — Z720 Tobacco use: Secondary | ICD-10-CM | POA: Diagnosis not present

## 2019-04-11 DIAGNOSIS — M170 Bilateral primary osteoarthritis of knee: Secondary | ICD-10-CM | POA: Diagnosis not present

## 2019-04-13 ENCOUNTER — Encounter (HOSPITAL_COMMUNITY): Payer: Self-pay

## 2019-04-13 ENCOUNTER — Other Ambulatory Visit: Payer: Self-pay

## 2019-04-13 ENCOUNTER — Observation Stay (HOSPITAL_COMMUNITY)
Admission: AD | Admit: 2019-04-13 | Discharge: 2019-04-13 | Disposition: A | Discharge status: Home / Self Care | Payer: Medicare Other | Attending: Psychiatry | Admitting: Psychiatry

## 2019-04-13 DIAGNOSIS — Z791 Long term (current) use of non-steroidal anti-inflammatories (NSAID): Secondary | ICD-10-CM | POA: Diagnosis not present

## 2019-04-13 DIAGNOSIS — Z79899 Other long term (current) drug therapy: Secondary | ICD-10-CM | POA: Diagnosis not present

## 2019-04-13 DIAGNOSIS — F250 Schizoaffective disorder, bipolar type: Principal | ICD-10-CM

## 2019-04-13 DIAGNOSIS — F10920 Alcohol use, unspecified with intoxication, uncomplicated: Secondary | ICD-10-CM | POA: Diagnosis not present

## 2019-04-13 DIAGNOSIS — F1410 Cocaine abuse, uncomplicated: Secondary | ICD-10-CM | POA: Insufficient documentation

## 2019-04-13 DIAGNOSIS — Z7984 Long term (current) use of oral hypoglycemic drugs: Secondary | ICD-10-CM | POA: Insufficient documentation

## 2019-04-13 DIAGNOSIS — F14120 Cocaine abuse with intoxication, uncomplicated: Secondary | ICD-10-CM | POA: Diagnosis not present

## 2019-04-13 DIAGNOSIS — Z888 Allergy status to other drugs, medicaments and biological substances status: Secondary | ICD-10-CM | POA: Diagnosis not present

## 2019-04-13 DIAGNOSIS — Z7289 Other problems related to lifestyle: Secondary | ICD-10-CM | POA: Insufficient documentation

## 2019-04-13 DIAGNOSIS — I10 Essential (primary) hypertension: Secondary | ICD-10-CM | POA: Insufficient documentation

## 2019-04-13 LAB — COMPREHENSIVE METABOLIC PANEL
ALT: 38 U/L (ref 0–44)
AST: 35 U/L (ref 15–41)
Albumin: 3.7 g/dL (ref 3.5–5.0)
Alkaline Phosphatase: 84 U/L (ref 38–126)
Anion gap: 11 (ref 5–15)
BUN: 12 mg/dL (ref 6–20)
CO2: 26 mmol/L (ref 22–32)
Calcium: 9.4 mg/dL (ref 8.9–10.3)
Chloride: 102 mmol/L (ref 98–111)
Creatinine, Ser: 0.77 mg/dL (ref 0.61–1.24)
GFR calc Af Amer: >60 mL/min (ref >60)
GFR calc non Af Amer: >60 mL/min (ref >60)
Glucose, Bld: 125 mg/dL — ABNORMAL HIGH (ref 70–99)
Potassium: 4.2 mmol/L (ref 3.5–5.1)
Sodium: 139 mmol/L (ref 135–145)
Total Bilirubin: 0.4 mg/dL (ref 0.3–1.2)
Total Protein: 7.3 g/dL (ref 6.5–8.1)

## 2019-04-13 LAB — CBC
HCT: 41.3 % (ref 39.0–52.0)
Hemoglobin: 13 g/dL (ref 13.0–17.0)
MCH: 28.1 pg (ref 26.0–34.0)
MCHC: 31.5 g/dL (ref 30.0–36.0)
MCV: 89.4 fL (ref 80.0–100.0)
Platelets: 377 10*3/uL (ref 150–400)
RBC: 4.62 MIL/uL (ref 4.22–5.81)
RDW: 14.5 % (ref 11.5–15.5)
WBC: 16.2 10*3/uL — ABNORMAL HIGH (ref 4.0–10.5)
nRBC: 0 % (ref 0.0–0.2)

## 2019-04-13 MED ORDER — RISPERIDONE 2 MG PO TABS
4.0000 mg | ORAL_TABLET | Freq: Every day | ORAL | Status: DC
  Administered 2019-04-13: 4 mg via ORAL
  Filled 2019-04-13: qty 2

## 2019-04-13 MED ORDER — IBUPROFEN 800 MG PO TABS: 800.0000 mg | ORAL_TABLET | Freq: Every day | ORAL | Status: DC

## 2019-04-13 MED ORDER — AMLODIPINE BESYLATE 5 MG PO TABS
10.0000 mg | ORAL_TABLET | Freq: Every day | ORAL | Status: DC
  Administered 2019-04-13: 10 mg via ORAL
  Filled 2019-04-13: qty 2

## 2019-04-13 MED ORDER — LORAZEPAM 1 MG PO TABS
1.5000 mg | ORAL_TABLET | Freq: Every day | ORAL | Status: DC
  Administered 2019-04-13: 1.5 mg via ORAL
  Filled 2019-04-13: qty 1

## 2019-04-13 MED ORDER — PANTOPRAZOLE SODIUM 40 MG PO TBEC: 40.0000 mg | DELAYED_RELEASE_TABLET | Freq: Every day | ORAL | Status: DC

## 2019-04-13 MED ORDER — METFORMIN HCL 500 MG PO TABS: 500.0000 mg | ORAL_TABLET | Freq: Every day | ORAL | Status: DC

## 2019-04-13 MED ORDER — VENLAFAXINE HCL ER 75 MG PO CP24: 225.0000 mg | ORAL_CAPSULE | Freq: Every day | ORAL | Status: DC

## 2019-04-13 MED ORDER — MAGNESIUM HYDROXIDE 400 MG/5ML PO SUSP: 30.0000 mL | Freq: Every day | ORAL | Status: DC | PRN

## 2019-04-13 MED ORDER — BENZTROPINE MESYLATE 1 MG PO TABS
1.0000 mg | ORAL_TABLET | Freq: Every day | ORAL | Status: DC
  Administered 2019-04-13: 1 mg via ORAL
  Filled 2019-04-13: qty 1

## 2019-04-13 MED ORDER — ALUM & MAG HYDROXIDE-SIMETH 200-200-20 MG/5ML PO SUSP: 30.0000 mL | ORAL | Status: DC | PRN

## 2019-04-13 MED ORDER — SULINDAC 200 MG PO TABS: 200.0000 mg | ORAL_TABLET | Freq: Two times a day (BID) | ORAL | Status: DC

## 2019-04-13 MED ORDER — ACETAMINOPHEN 325 MG PO TABS: 650.0000 mg | ORAL_TABLET | Freq: Four times a day (QID) | ORAL | Status: DC | PRN

## 2019-04-13 MED ORDER — LISINOPRIL 20 MG PO TABS
20.0000 mg | ORAL_TABLET | Freq: Every day | ORAL | Status: DC
  Administered 2019-04-13: 20 mg via ORAL
  Filled 2019-04-13: qty 1
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
## 2019-04-13 NOTE — Discharge Summary (Addendum)
Discharge Summary:  On Evaluation 04/13/2019: Jacob [name] is awake, alert and oriented X4, seen resting in the observation unit.Denies suicidal or homicidal ideation. Denies auditory or visual hallucination and does not appear to be responding to internal stimuli. Patient reports "I feel okay, the medicine is helping me to sleep and Im not really crazy. My brother wants to become my legal guardian so he keeps committing me. He is a Administrator and is always gone. I can still think for myself. I have a psychiatrist that comes out to my house once a month, along with a nurse and people who take me to the store for groceries and stuff. He just committed me two weeks ago too for the same reason." Patient reports he is followed by Psychiatry and he has been fine up until now. Patient reports he uses cocaine about 1-2 a month, and drinks about 1-2 beers a week. Support, encouragement and reassurance was provided.  Jacob [name] was found stable for discharge. He left [facility] in stable condition with all items returned to him. He is expected to bring a copy of his insurance card to the appointment.   Hospital Course: Patient was admitted to the Observation unit of [facility] under the service of Dr. Darleene Cleaver. Safety: Placed in continuous observation for safety. During the course of this hospitalization patient did not required any change on his observation and no PRN was required. No major behavioral problems reported during the hospitalization. On initial assessment patient verbalized no new identifying symptoms and his main concern is his psychodynamic issues regarding his brother and substance abuse. Patient was able to engage well with peers and staff, adjusted very well to the milieu, and he remained pleasant with brighter affect and able to participate in his daily evaluations.  Patient was able to verbalize reasons for living and appears to have a positive outlook toward his future. A safety plan was  discussed with him. He was provided with national suicide Hotline phone # 1-800-273-TALK as well as [place]. Pt continuously refuted any suicidal ideations or history of previous suicide attempts. General Medical Problems: Patient medically stable and baseline physical exam within normal limits with no abnormal findings.  Patient seen face-to-face for psychiatric evaluation, chart reviewed and case discussed with the physician extender and developed treatment plan. Reviewed the information documented and agree with the treatment plan. Corena Pilgrim, MD
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
## 2019-04-13 NOTE — Plan of Care (Signed)
[place] Observation Crisis PlanBrother  Reason for Crisis Plan:  Crisis Stabilization   Plan of Care:  Referral for Telepsychiatry/Psychiatric Consult  Family Support:    Brother  Current Living Environment:  Living Arrangements: Other relatives  Insurance:   Hospital Account    Name Acct ID Class Status Primary Coverage   Jacob [name], Jacob [name] [id number] [place]        Guarantor Account (for Hospital Account [id number])    Name Relation to Pt Service Area Active? Acct Type   Jacob [name]   Address Phone       [address] [place], [place] [address] [phone])          Coverage Information (for Hospital Account [id number])    F/O Payor/Plan Precert #   MEDICAID [place]/MEDICAID [place] #   Jacob [name], Jacob [name] [id number] [facility]   Address Phone   [address] [address] [place], [place] [address] [phone]      Legal Guardian:  Legal Guardian: Other:(Selef)  Primary Care Provider:  [name], MD  Current Outpatient Providers:    Psychiatrist:    Counselor/Therapist:    Compliant with Medications:  Yes  Additional Information:   Jacob [name] 4/19/20203:14 AM

## 2019-04-13 NOTE — H&P (Signed)
Behavioral Health Medical Screening Exam  Jacob [name] is an 53 y.o. male.  Total Time spent with patient: 30 minutes  Psychiatric Specialty Exam: Physical Exam  Constitutional: He is oriented to person, place, and time. He appears well-developed and well-nourished. No distress.  HENT:  Head: Normocephalic and atraumatic.  Right Ear: External ear normal.  Left Ear: External ear normal.  Eyes: Pupils are equal, round, and reactive to light.  Respiratory: Effort normal. No respiratory distress.  Musculoskeletal: Normal range of motion.  Neurological: He is alert and oriented to person, place, and time.  Skin: He is not diaphoretic.  Psychiatric: His mood appears anxious. He is not withdrawn and not actively hallucinating. Thought content is not paranoid and not delusional. He exhibits a depressed mood. He expresses no homicidal and no suicidal ideation.    Review of Systems  Constitutional: Negative for chills, diaphoresis, fever, malaise/fatigue and weight loss.  Respiratory: Negative for cough and shortness of breath.   Cardiovascular: Negative for chest pain.  Gastrointestinal: Negative for diarrhea, nausea and vomiting.  Psychiatric/Behavioral: Positive for depression and substance abuse. Negative for hallucinations, memory loss and suicidal ideas. The patient is nervous/anxious and has insomnia.     Blood pressure (!) 153/114, pulse (!) 107, temperature 98.1 F (36.7 C), temperature source Oral, resp. rate 20.There is no height or weight on file to calculate BMI.  General Appearance: Casual and Fairly Groomed  Eye Contact:  Good  Speech:  Clear and Coherent and Normal Rate  Volume:  Normal  Mood:  Anxious and Depressed  Affect:  Congruent and Depressed  Thought Process:  Coherent and Descriptions of Associations: Intact  Orientation:  Full (Time, Place, and Person)  Thought Content:  Logical and Hallucinations: None  Suicidal Thoughts:  No  Homicidal Thoughts:  No   Memory:  Immediate;   Fair Recent;   Fair  Judgement:  Fair  Insight:  Fair  Psychomotor Activity:  Normal  Concentration: Concentration: Fair and Attention Span: Fair  Recall:  [employer] of Knowledge:Good  Language: Good  Akathisia:  Negative  Handed:  Right  AIMS (if indicated):     Assets:  Communication Skills Desire for Improvement Financial Resources/Insurance Housing Intimacy Leisure Time Physical Health  Sleep:       Musculoskeletal: Strength & Muscle Tone: within normal limits Gait & Station: normal   Blood pressure (!) 153/114, pulse (!) 107, temperature 98.1 F (36.7 C), temperature source Oral, resp. rate 20.  Recommendations:  Based on my evaluation the patient does not appear to have an emergency medical condition.  Rozetta Nunnery, NP 04/13/2019, 2:54 AM

## 2019-05-02 DIAGNOSIS — M170 Bilateral primary osteoarthritis of knee: Secondary | ICD-10-CM | POA: Diagnosis not present

## 2019-05-28 DIAGNOSIS — Z720 Tobacco use: Secondary | ICD-10-CM | POA: Diagnosis not present

## 2019-05-28 DIAGNOSIS — E782 Mixed hyperlipidemia: Secondary | ICD-10-CM | POA: Diagnosis not present

## 2019-05-28 DIAGNOSIS — I10 Essential (primary) hypertension: Secondary | ICD-10-CM | POA: Diagnosis not present

## 2019-05-28 DIAGNOSIS — G894 Chronic pain syndrome: Secondary | ICD-10-CM | POA: Diagnosis not present

## 2019-05-28 DIAGNOSIS — E1165 Type 2 diabetes mellitus with hyperglycemia: Secondary | ICD-10-CM | POA: Diagnosis not present

## 2019-05-28 DIAGNOSIS — J028 Acute pharyngitis due to other specified organisms: Secondary | ICD-10-CM | POA: Diagnosis not present

## 2019-05-28 DIAGNOSIS — N528 Other male erectile dysfunction: Secondary | ICD-10-CM | POA: Diagnosis not present

## 2019-05-28 DIAGNOSIS — E669 Obesity, unspecified: Secondary | ICD-10-CM | POA: Diagnosis not present

## 2019-07-04 DIAGNOSIS — M170 Bilateral primary osteoarthritis of knee: Secondary | ICD-10-CM | POA: Diagnosis not present

## 2019-07-14 DIAGNOSIS — I10 Essential (primary) hypertension: Secondary | ICD-10-CM | POA: Diagnosis not present

## 2019-07-14 DIAGNOSIS — J028 Acute pharyngitis due to other specified organisms: Secondary | ICD-10-CM | POA: Diagnosis not present

## 2019-07-14 DIAGNOSIS — Z720 Tobacco use: Secondary | ICD-10-CM | POA: Diagnosis not present

## 2019-07-14 DIAGNOSIS — E782 Mixed hyperlipidemia: Secondary | ICD-10-CM | POA: Diagnosis not present

## 2019-07-14 DIAGNOSIS — E1165 Type 2 diabetes mellitus with hyperglycemia: Secondary | ICD-10-CM | POA: Diagnosis not present

## 2019-07-14 DIAGNOSIS — E669 Obesity, unspecified: Secondary | ICD-10-CM | POA: Diagnosis not present

## 2019-07-14 DIAGNOSIS — E538 Deficiency of other specified B group vitamins: Secondary | ICD-10-CM | POA: Diagnosis not present

## 2019-07-14 DIAGNOSIS — N528 Other male erectile dysfunction: Secondary | ICD-10-CM | POA: Diagnosis not present

## 2019-07-14 DIAGNOSIS — G894 Chronic pain syndrome: Secondary | ICD-10-CM | POA: Diagnosis not present

## 2019-07-17 ENCOUNTER — Ambulatory Visit (AMBULATORY_SURGERY_CENTER): Payer: Self-pay | Admitting: *Deleted

## 2019-07-17 ENCOUNTER — Other Ambulatory Visit: Payer: Self-pay

## 2019-07-17 VITALS — Ht 73.0 in | Wt 222.0 lb

## 2019-07-17 DIAGNOSIS — Z1211 Encounter for screening for malignant neoplasm of colon: Secondary | ICD-10-CM

## 2019-07-17 MED ORDER — DULCOLAX 5 MG PO TBEC: 5.0000 mg | DELAYED_RELEASE_TABLET | Freq: Once | ORAL | 0 refills | Status: DC

## 2019-07-17 MED ORDER — SUPREP BOWEL PREP KIT 17.5-3.13-1.6 GM/177ML PO SOLN: 1.0000 | Freq: Once | ORAL | 0 refills | Status: AC

## 2019-07-17 MED ORDER — POLYETHYLENE GLYCOL 3350 17 GM/SCOOP PO POWD: 1.0000 | Freq: Once | ORAL | 0 refills | Status: AC

## 2019-07-17 NOTE — Progress Notes (Signed)
No egg or soy allergy known to patient  No issues with past sedation with any surgeries  or procedures, no intubation problems  No diet pills per patient No home 02 use per patient  No blood thinners per patient  Pt denies issues with constipation  No A fib or A flutter  EMMI video sent to pt's e mail   Pt verified name, DOB, address and insurance during PV today. . Pt and I did in person PV - we discussed the prep instructions several times today in PV- he verbalized understanding of all instructions -- high lighted number to call with all and any questions   Pt is aware that care partner will wait in the car during procedure; if they feel like they will be too hot to wait in the car; they may wait in the lobby.  We want them to wear a mask (we do not have any that we can provide them), practice social distancing, and we will check their temperatures when they get here.  I did remind patient that their care partner needs to stay in the parking lot the entire time. Pt will wear mask into building.

## 2019-07-21 DIAGNOSIS — Z1389 Encounter for screening for other disorder: Secondary | ICD-10-CM | POA: Diagnosis not present

## 2019-07-21 DIAGNOSIS — I10 Essential (primary) hypertension: Secondary | ICD-10-CM | POA: Diagnosis not present

## 2019-07-21 DIAGNOSIS — G894 Chronic pain syndrome: Secondary | ICD-10-CM | POA: Diagnosis not present

## 2019-07-21 DIAGNOSIS — Z0001 Encounter for general adult medical examination with abnormal findings: Secondary | ICD-10-CM | POA: Diagnosis not present

## 2019-07-21 DIAGNOSIS — Z720 Tobacco use: Secondary | ICD-10-CM | POA: Diagnosis not present

## 2019-07-21 DIAGNOSIS — N528 Other male erectile dysfunction: Secondary | ICD-10-CM | POA: Diagnosis not present

## 2019-07-21 DIAGNOSIS — E782 Mixed hyperlipidemia: Secondary | ICD-10-CM | POA: Diagnosis not present

## 2019-07-21 DIAGNOSIS — E1165 Type 2 diabetes mellitus with hyperglycemia: Secondary | ICD-10-CM | POA: Diagnosis not present

## 2019-07-21 DIAGNOSIS — E538 Deficiency of other specified B group vitamins: Secondary | ICD-10-CM | POA: Diagnosis not present

## 2019-07-21 DIAGNOSIS — E669 Obesity, unspecified: Secondary | ICD-10-CM | POA: Diagnosis not present

## 2019-07-30 ENCOUNTER — Telehealth: Payer: Self-pay | Admitting: Gastroenterology

## 2019-07-30 NOTE — Telephone Encounter (Signed)
Pt canceled colon for tomorrow until 08/08/2019 because his care partner "backed out" due to the covid-19.

## 2019-07-30 NOTE — Telephone Encounter (Signed)
Patient apparently rescheduled his colonoscopy to next week.  We have had significant challenges getting a successful colonoscopy on this patient, including the prep. Please contact him and be sure he understands new timing and review his 2-day prep.

## 2019-07-30 NOTE — Telephone Encounter (Signed)
Reviewed the entire 2-day prep with the patient. The patient seemed to have a good grasp on the instructions being that the previsit nurse spent an hour reviewing the instruction with the patient. Patient told to call the office is he had anymore questions or any concerns.

## 2019-07-31 ENCOUNTER — Encounter: Payer: Medicare Other | Admitting: Gastroenterology

## 2019-08-04 DIAGNOSIS — E1165 Type 2 diabetes mellitus with hyperglycemia: Secondary | ICD-10-CM | POA: Diagnosis not present

## 2019-08-04 DIAGNOSIS — E538 Deficiency of other specified B group vitamins: Secondary | ICD-10-CM | POA: Diagnosis not present

## 2019-08-04 DIAGNOSIS — I10 Essential (primary) hypertension: Secondary | ICD-10-CM | POA: Diagnosis not present

## 2019-08-04 DIAGNOSIS — G894 Chronic pain syndrome: Secondary | ICD-10-CM | POA: Diagnosis not present

## 2019-08-04 DIAGNOSIS — E782 Mixed hyperlipidemia: Secondary | ICD-10-CM | POA: Diagnosis not present

## 2019-08-04 DIAGNOSIS — E669 Obesity, unspecified: Secondary | ICD-10-CM | POA: Diagnosis not present

## 2019-08-04 DIAGNOSIS — Z720 Tobacco use: Secondary | ICD-10-CM | POA: Diagnosis not present

## 2019-08-04 DIAGNOSIS — N528 Other male erectile dysfunction: Secondary | ICD-10-CM | POA: Diagnosis not present

## 2019-08-07 ENCOUNTER — Telehealth: Payer: Self-pay | Admitting: Gastroenterology

## 2019-08-07 NOTE — Telephone Encounter (Signed)
Covid-19 Screening Questions      Do you now or have you had a fever in the last 14 days?    NO    Do you have any respiratory symptoms of shortness of breath or cough now or in the last 14 days?    NO   Do you have any family members or close contacts with diagnosed or suspected Covid-19 in the past 14 days?     NO   Have you been tested for Covid-19 and found to be positive?    NO   Pt made aware of that care partner may wait in the car or come up to the lobby during the procedure but will need to provide their own mask.

## 2019-08-08 ENCOUNTER — Ambulatory Visit (AMBULATORY_SURGERY_CENTER): Payer: Medicare Other | Admitting: Gastroenterology

## 2019-08-08 ENCOUNTER — Encounter: Payer: Self-pay | Admitting: Gastroenterology

## 2019-08-08 ENCOUNTER — Other Ambulatory Visit: Payer: Self-pay

## 2019-08-08 VITALS — BP 117/90 | HR 106 | Temp 97.3°F | Resp 25 | Ht 73.0 in | Wt 222.0 lb

## 2019-08-08 DIAGNOSIS — Z1211 Encounter for screening for malignant neoplasm of colon: Secondary | ICD-10-CM

## 2019-08-08 MED ORDER — SODIUM CHLORIDE 0.9 % IV SOLN: 500.0000 mL | Freq: Once | INTRAVENOUS | Status: DC

## 2019-08-08 NOTE — Op Note (Signed)
[facility] Patient Name: Jacob [name] Procedure Date: 08/08/2019 10:03 AM MRN: [id number] Endoscopist: [place]. Loletha Carrow , MD Age: 53 Referring MD:  Date of Birth: [birth year] Gender: Male Account #: [id number] Procedure:                Colonoscopy Indications:              Screening for colorectal malignant neoplasm (poor                            prep 05/2018) Medicines:                Monitored Anesthesia Care Procedure:                Pre-Anesthesia Assessment:                           - Prior to the procedure, a History and Physical                            was performed, and patient medications and                            allergies were reviewed. The patient's tolerance of                            previous anesthesia was also reviewed. The risks                            and benefits of the procedure and the sedation                            options and risks were discussed with the patient.                            All questions were answered, and informed consent                            was obtained. Prior Anticoagulants: The patient has                            taken no previous anticoagulant or antiplatelet                            agents. ASA Grade Assessment: III - A patient with                            severe systemic disease. After reviewing the risks                            and benefits, the patient was deemed in                            satisfactory condition to undergo the procedure.  After obtaining informed consent, the colonoscope                            was passed under direct vision. Throughout the                            procedure, the patient's blood pressure, pulse, and                            oxygen saturations were monitored continuously. The                            Colonoscope was introduced through the anus and                            advanced to the the cecum, identified by                             appendiceal orifice and ileocecal valve. The                            colonoscopy was performed without difficulty. The                            patient tolerated the procedure well. The quality                            of the bowel preparation was excellent. The                            ileocecal valve, appendiceal orifice, and rectum                            were photographed. The bowel preparation used was 2                            day Suprep/Miralax. The quality of the bowel                            preparation was evaluated using the BBPS [facility]                            Bowel Preparation Scale) with scores of: Right                            Colon = 2, Transverse Colon = 3 and Left Colon = 3.                            The total BBPS score equals 8. Scope In: 10:30:36 AM Scope Out: 10:43:10 AM Scope Withdrawal Time: 0 hours 9 minutes 10 seconds  Total Procedure Duration: 0 hours 12 minutes 34 seconds  Findings:                 The perianal and  digital rectal examinations were                            normal.                           A few small-mouthed diverticula were found in the                            transverse colon.                           Internal hemorrhoids were found. The hemorrhoids                            were Grade I (internal hemorrhoids that do not                            prolapse).                           The exam was otherwise without abnormality on                            direct and retroflexion views. Complications:            No immediate complications. Estimated Blood Loss:     Estimated blood loss: none. Impression:               - Diverticulosis in the transverse colon.                           - Internal hemorrhoids.                           - The examination was otherwise normal on direct                            and retroflexion views.                           - No specimens  collected. Recommendation:           - Patient has a contact number available for                            emergencies. The signs and symptoms of potential                            delayed complications were discussed with the                            patient. Return to normal activities tomorrow.                            Written discharge instructions were provided to the  patient.                           - Resume previous diet.                           - Continue present medications.                           - Repeat colonoscopy in 10 years for screening                            purposes. Jorita Bohanon L. Loletha Carrow, MD 08/08/2019 10:47:50 AM This report has been signed electronically.

## 2019-08-08 NOTE — Progress Notes (Signed)
Report given to PACU, vss 

## 2019-08-08 NOTE — Progress Notes (Signed)
Pt's states no medical or surgical changes since previsit or office visit. 

## 2019-08-08 NOTE — Progress Notes (Signed)
SP- Temp CW- Vitals

## 2019-08-08 NOTE — Patient Instructions (Signed)
YOU HAD AN ENDOSCOPIC PROCEDURE TODAY AT [facility]:   Refer to the procedure report that was given to you for any specific questions about what was found during the examination.  If the procedure report does not answer your questions, please call your gastroenterologist to clarify.  If you requested that your care partner not be given the details of your procedure findings, then the procedure report has been included in a sealed envelope for you to review at your convenience later.  YOU SHOULD EXPECT: Some feelings of bloating in the abdomen. Passage of more gas than usual.  Walking can help get rid of the air that was put into your GI tract during the procedure and reduce the bloating. If you had a lower endoscopy (such as a colonoscopy or flexible sigmoidoscopy) you may notice spotting of blood in your stool or on the toilet paper. If you underwent a bowel prep for your procedure, you may not have a normal bowel movement for a few days.  Please Note:  You might notice some irritation and congestion in your nose or some drainage.  This is from the oxygen used during your procedure.  There is no need for concern and it should clear up in a day or so.  SYMPTOMS TO REPORT IMMEDIATELY:   Following lower endoscopy (colonoscopy or flexible sigmoidoscopy):  Excessive amounts of blood in the stool  Significant tenderness or worsening of abdominal pains  Swelling of the abdomen that is new, acute  Fever of 100F or higher  For urgent or emergent issues, a gastroenterologist can be reached at any hour by calling [phone].  DIET:  We do recommend a small meal at first, but then you may proceed to your regular diet.  Drink plenty of fluids but you should avoid alcoholic beverages for 24 hours.  ACTIVITY:  You should plan to take it easy for the rest of today and you should NOT DRIVE or use heavy machinery until tomorrow (because of the sedation medicines used during the test).     FOLLOW UP: Our staff will call the number listed on your records 48-72 hours following your procedure to check on you and address any questions or concerns that you may have regarding the information given to you following your procedure. If we do not reach you, we will leave a message.  We will attempt to reach you two times.  During this call, we will ask if you have developed any symptoms of COVID 19. If you develop any symptoms (ie: fever, flu-like symptoms, shortness of breath, cough etc.) before then, please call [phone].  If you test positive for Covid 19 in the 2 weeks post procedure, please call and report this information to [place].    SIGNATURES/CONFIDENTIALITY: You and/or your care partner have signed paperwork which will be entered into your electronic medical record.  These signatures attest to the fact that that the information above on your After Visit Summary has been reviewed and is understood.  Full responsibility of the confidentiality of this discharge information lies with you and/or your care-partner.  Next colonoscopy- 10 years  Continue your normal medications  Please read over handouts about hemorrhoids and diverticulosis

## 2019-08-12 ENCOUNTER — Telehealth: Payer: Self-pay

## 2019-08-12 ENCOUNTER — Other Ambulatory Visit (INDEPENDENT_AMBULATORY_CARE_PROVIDER_SITE_OTHER): Payer: Self-pay | Admitting: Orthopedic Surgery

## 2019-08-12 NOTE — Telephone Encounter (Signed)
  Follow up Call-  Call back number 08/08/2019 06/10/2018  Post procedure Call Back phone  # [phone]  Permission to leave phone message Yes Yes  Some recent data might be hidden     Patient questions:  Do you have a fever, pain , or abdominal swelling? No. Pain Score  0 *  Have you tolerated food without any problems? Yes.    Have you been able to return to your normal activities? Yes.    Do you have any questions about your discharge instructions: Diet   No. Medications  No. Follow up visit  No.  Do you have questions or concerns about your Care? No.  Actions: * If pain score is 4 or above: No action needed, pain <4.   1. Have you developed a fever since your procedure? n0  2.   Have you had an respiratory symptoms (SOB or cough) since your procedure? no  3.   Have you tested positive for COVID 19 since your procedure no  4.   Have you had any family members/close contacts diagnosed with the COVID 19 since your procedure?  no   If yes to any of these questions please route to Joylene John, RN and Alphonsa Gin, Therapist, sports.

## 2019-08-13 DIAGNOSIS — G894 Chronic pain syndrome: Secondary | ICD-10-CM | POA: Diagnosis not present

## 2019-08-13 DIAGNOSIS — M79605 Pain in left leg: Secondary | ICD-10-CM | POA: Diagnosis not present

## 2019-08-13 DIAGNOSIS — M79604 Pain in right leg: Secondary | ICD-10-CM | POA: Diagnosis not present

## 2019-08-13 DIAGNOSIS — M545 Low back pain: Secondary | ICD-10-CM | POA: Diagnosis not present

## 2019-08-16 ENCOUNTER — Encounter (HOSPITAL_COMMUNITY): Payer: Self-pay

## 2019-08-16 ENCOUNTER — Emergency Department (HOSPITAL_COMMUNITY): Payer: No Typology Code available for payment source

## 2019-08-16 ENCOUNTER — Emergency Department (HOSPITAL_COMMUNITY)
Admission: EM | Admit: 2019-08-16 | Discharge: 2019-08-16 | Disposition: A | Discharge status: Home / Self Care | Payer: No Typology Code available for payment source | Attending: Emergency Medicine | Admitting: Emergency Medicine

## 2019-08-16 ENCOUNTER — Other Ambulatory Visit: Payer: Self-pay

## 2019-08-16 DIAGNOSIS — F17210 Nicotine dependence, cigarettes, uncomplicated: Secondary | ICD-10-CM | POA: Insufficient documentation

## 2019-08-16 DIAGNOSIS — M25512 Pain in left shoulder: Secondary | ICD-10-CM | POA: Diagnosis not present

## 2019-08-16 DIAGNOSIS — E119 Type 2 diabetes mellitus without complications: Secondary | ICD-10-CM | POA: Diagnosis not present

## 2019-08-16 DIAGNOSIS — S4992XA Unspecified injury of left shoulder and upper arm, initial encounter: Secondary | ICD-10-CM | POA: Insufficient documentation

## 2019-08-16 DIAGNOSIS — R0902 Hypoxemia: Secondary | ICD-10-CM | POA: Diagnosis not present

## 2019-08-16 DIAGNOSIS — M25519 Pain in unspecified shoulder: Secondary | ICD-10-CM | POA: Diagnosis not present

## 2019-08-16 DIAGNOSIS — M545 Low back pain: Secondary | ICD-10-CM | POA: Diagnosis not present

## 2019-08-16 DIAGNOSIS — G8929 Other chronic pain: Secondary | ICD-10-CM

## 2019-08-16 DIAGNOSIS — M25562 Pain in left knee: Secondary | ICD-10-CM | POA: Insufficient documentation

## 2019-08-16 DIAGNOSIS — Y92410 Unspecified street and highway as the place of occurrence of the external cause: Secondary | ICD-10-CM | POA: Diagnosis not present

## 2019-08-16 DIAGNOSIS — R000 Tachycardia, unspecified: Secondary | ICD-10-CM | POA: Diagnosis not present

## 2019-08-16 DIAGNOSIS — I10 Essential (primary) hypertension: Secondary | ICD-10-CM | POA: Insufficient documentation

## 2019-08-16 DIAGNOSIS — Y939 Activity, unspecified: Secondary | ICD-10-CM | POA: Diagnosis not present

## 2019-08-16 DIAGNOSIS — Y999 Unspecified external cause status: Secondary | ICD-10-CM | POA: Diagnosis not present

## 2019-08-16 DIAGNOSIS — M25561 Pain in right knee: Secondary | ICD-10-CM | POA: Diagnosis not present

## 2019-08-16 DIAGNOSIS — Z7984 Long term (current) use of oral hypoglycemic drugs: Secondary | ICD-10-CM | POA: Diagnosis not present

## 2019-08-16 DIAGNOSIS — Z79899 Other long term (current) drug therapy: Secondary | ICD-10-CM | POA: Insufficient documentation

## 2019-08-16 DIAGNOSIS — R52 Pain, unspecified: Secondary | ICD-10-CM | POA: Diagnosis not present

## 2019-08-16 MED ORDER — OXYCODONE-ACETAMINOPHEN 5-325 MG PO TABS
2.0000 | ORAL_TABLET | Freq: Once | ORAL | Status: AC
  Administered 2019-08-16: 2 via ORAL
  Filled 2019-08-16: qty 2
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
## 2019-08-16 NOTE — ED Notes (Signed)
Patient transported to X-ray 

## 2019-08-21 ENCOUNTER — Emergency Department (HOSPITAL_COMMUNITY): Payer: No Typology Code available for payment source

## 2019-08-21 ENCOUNTER — Encounter (HOSPITAL_COMMUNITY): Payer: Self-pay

## 2019-08-21 ENCOUNTER — Other Ambulatory Visit: Payer: Self-pay

## 2019-08-21 ENCOUNTER — Emergency Department (HOSPITAL_COMMUNITY)
Admission: EM | Admit: 2019-08-21 | Discharge: 2019-08-21 | Disposition: A | Discharge status: Home / Self Care | Payer: No Typology Code available for payment source | Attending: Emergency Medicine | Admitting: Emergency Medicine

## 2019-08-21 DIAGNOSIS — Y92410 Unspecified street and highway as the place of occurrence of the external cause: Secondary | ICD-10-CM | POA: Diagnosis not present

## 2019-08-21 DIAGNOSIS — R52 Pain, unspecified: Secondary | ICD-10-CM | POA: Diagnosis not present

## 2019-08-21 DIAGNOSIS — G8929 Other chronic pain: Secondary | ICD-10-CM | POA: Insufficient documentation

## 2019-08-21 DIAGNOSIS — E119 Type 2 diabetes mellitus without complications: Secondary | ICD-10-CM | POA: Diagnosis not present

## 2019-08-21 DIAGNOSIS — F17210 Nicotine dependence, cigarettes, uncomplicated: Secondary | ICD-10-CM | POA: Diagnosis not present

## 2019-08-21 DIAGNOSIS — M549 Dorsalgia, unspecified: Secondary | ICD-10-CM | POA: Diagnosis not present

## 2019-08-21 DIAGNOSIS — M25462 Effusion, left knee: Secondary | ICD-10-CM

## 2019-08-21 DIAGNOSIS — M546 Pain in thoracic spine: Secondary | ICD-10-CM | POA: Insufficient documentation

## 2019-08-21 DIAGNOSIS — I10 Essential (primary) hypertension: Secondary | ICD-10-CM | POA: Insufficient documentation

## 2019-08-21 DIAGNOSIS — Z79899 Other long term (current) drug therapy: Secondary | ICD-10-CM | POA: Diagnosis not present

## 2019-08-21 DIAGNOSIS — M25512 Pain in left shoulder: Secondary | ICD-10-CM | POA: Diagnosis not present

## 2019-08-21 DIAGNOSIS — R000 Tachycardia, unspecified: Secondary | ICD-10-CM | POA: Diagnosis not present

## 2019-08-21 DIAGNOSIS — Y999 Unspecified external cause status: Secondary | ICD-10-CM | POA: Diagnosis not present

## 2019-08-21 DIAGNOSIS — R0902 Hypoxemia: Secondary | ICD-10-CM | POA: Diagnosis not present

## 2019-08-21 DIAGNOSIS — Y9389 Activity, other specified: Secondary | ICD-10-CM | POA: Diagnosis not present

## 2019-08-21 DIAGNOSIS — Z7984 Long term (current) use of oral hypoglycemic drugs: Secondary | ICD-10-CM | POA: Insufficient documentation

## 2019-08-21 DIAGNOSIS — S299XXA Unspecified injury of thorax, initial encounter: Secondary | ICD-10-CM | POA: Diagnosis not present

## 2019-08-21 DIAGNOSIS — M5489 Other dorsalgia: Secondary | ICD-10-CM | POA: Diagnosis not present

## 2019-08-21 DIAGNOSIS — R0602 Shortness of breath: Secondary | ICD-10-CM | POA: Diagnosis not present

## 2019-08-21 DIAGNOSIS — S46912A Strain of unspecified muscle, fascia and tendon at shoulder and upper arm level, left arm, initial encounter: Secondary | ICD-10-CM | POA: Diagnosis not present

## 2019-08-21 DIAGNOSIS — S4992XA Unspecified injury of left shoulder and upper arm, initial encounter: Secondary | ICD-10-CM | POA: Diagnosis not present

## 2019-08-21 LAB — RAPID URINE DRUG SCREEN, HOSP PERFORMED
Amphetamines: NOT DETECTED
Barbiturates: NOT DETECTED
Benzodiazepines: POSITIVE — AB
Cocaine: POSITIVE — AB
Opiates: POSITIVE — AB
Tetrahydrocannabinol: NOT DETECTED

## 2019-08-21 LAB — COMPREHENSIVE METABOLIC PANEL
ALT: 33 U/L (ref 0–44)
AST: 22 U/L (ref 15–41)
Albumin: 4.1 g/dL (ref 3.5–5.0)
Alkaline Phosphatase: 80 U/L (ref 38–126)
Anion gap: 12 (ref 5–15)
BUN: 19 mg/dL (ref 6–20)
CO2: 23 mmol/L (ref 22–32)
Calcium: 9.3 mg/dL (ref 8.9–10.3)
Chloride: 102 mmol/L (ref 98–111)
Creatinine, Ser: 0.82 mg/dL (ref 0.61–1.24)
GFR calc Af Amer: >60 mL/min (ref >60)
GFR calc non Af Amer: >60 mL/min (ref >60)
Glucose, Bld: 144 mg/dL — ABNORMAL HIGH (ref 70–99)
Potassium: 3.4 mmol/L — ABNORMAL LOW (ref 3.5–5.1)
Sodium: 137 mmol/L (ref 135–145)
Total Bilirubin: 0.3 mg/dL (ref 0.3–1.2)
Total Protein: 7.6 g/dL (ref 6.5–8.1)

## 2019-08-21 LAB — CBC WITH DIFFERENTIAL/PLATELET
Abs Immature Granulocytes: 0.15 10*3/uL — ABNORMAL HIGH (ref 0.00–0.07)
Basophils Absolute: 0.1 10*3/uL (ref 0.0–0.1)
Basophils Relative: 1 %
Eosinophils Absolute: 0.2 10*3/uL (ref 0.0–0.5)
Eosinophils Relative: 1 %
HCT: 46.5 % (ref 39.0–52.0)
Hemoglobin: 14.8 g/dL (ref 13.0–17.0)
Immature Granulocytes: 1 %
Lymphocytes Relative: 25 %
Lymphs Abs: 3.5 10*3/uL (ref 0.7–4.0)
MCH: 28.4 pg (ref 26.0–34.0)
MCHC: 31.8 g/dL (ref 30.0–36.0)
MCV: 89.1 fL (ref 80.0–100.0)
Monocytes Absolute: 0.9 10*3/uL (ref 0.1–1.0)
Monocytes Relative: 6 %
Neutro Abs: 9.5 10*3/uL — ABNORMAL HIGH (ref 1.7–7.7)
Neutrophils Relative %: 66 %
Platelets: 426 10*3/uL — ABNORMAL HIGH (ref 150–400)
RBC: 5.22 MIL/uL (ref 4.22–5.81)
RDW: 14.6 % (ref 11.5–15.5)
WBC: 14.3 10*3/uL — ABNORMAL HIGH (ref 4.0–10.5)
nRBC: 0 % (ref 0.0–0.2)

## 2019-08-21 LAB — URINALYSIS, ROUTINE W REFLEX MICROSCOPIC
Bilirubin Urine: NEGATIVE
Glucose, UA: NEGATIVE mg/dL
Hgb urine dipstick: NEGATIVE
Ketones, ur: 5 mg/dL — AB
Leukocytes,Ua: NEGATIVE
Nitrite: NEGATIVE
Protein, ur: NEGATIVE mg/dL
Specific Gravity, Urine: 1.03 (ref 1.005–1.030)
pH: 5 (ref 5.0–8.0)

## 2019-08-21 LAB — TROPONIN I (HIGH SENSITIVITY): Troponin I (High Sensitivity): 8 ng/L (ref <18)

## 2019-08-21 MED ORDER — SODIUM CHLORIDE (PF) 0.9 % IJ SOLN
INTRAMUSCULAR | Status: AC
  Filled 2019-08-21: qty 50

## 2019-08-21 MED ORDER — MORPHINE SULFATE (PF) 4 MG/ML IV SOLN
4.0000 mg | Freq: Once | INTRAVENOUS | Status: AC
  Administered 2019-08-21: 4 mg via INTRAVENOUS
  Filled 2019-08-21: qty 1

## 2019-08-21 MED ORDER — HYDROMORPHONE HCL 1 MG/ML IJ SOLN: 1.0000 mg | Freq: Once | INTRAMUSCULAR | Status: DC

## 2019-08-21 MED ORDER — SODIUM CHLORIDE 0.9 % IV BOLUS
1000.0000 mL | Freq: Once | INTRAVENOUS | Status: AC
  Administered 2019-08-21: 1000 mL via INTRAVENOUS

## 2019-08-21 MED ORDER — HYDROMORPHONE HCL 2 MG/ML IJ SOLN
2.0000 mg | Freq: Once | INTRAMUSCULAR | Status: AC
  Administered 2019-08-21: 2 mg via INTRAVENOUS
  Filled 2019-08-21: qty 1

## 2019-08-21 MED ORDER — IOHEXOL 350 MG/ML SOLN
100.0000 mL | Freq: Once | INTRAVENOUS | Status: AC | PRN
  Administered 2019-08-21: 100 mL via INTRAVENOUS
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
## 2019-08-21 NOTE — ED Provider Notes (Deleted)
Patient left without being seen after triage. I didn't establish care with patient.    Drenda Freeze, MD 08/21/19 [phone]
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
## 2019-08-26 ENCOUNTER — Ambulatory Visit (INDEPENDENT_AMBULATORY_CARE_PROVIDER_SITE_OTHER): Payer: Medicare Other | Admitting: Orthopedic Surgery

## 2019-08-26 ENCOUNTER — Encounter: Payer: Self-pay | Admitting: Orthopedic Surgery

## 2019-08-26 VITALS — Ht 72.0 in | Wt 237.0 lb

## 2019-08-26 DIAGNOSIS — M7542 Impingement syndrome of left shoulder: Secondary | ICD-10-CM

## 2019-08-26 DIAGNOSIS — M1712 Unilateral primary osteoarthritis, left knee: Secondary | ICD-10-CM

## 2019-08-26 MED ORDER — METHYLPREDNISOLONE ACETATE 40 MG/ML IJ SUSP
40.0000 mg | INTRAMUSCULAR | Status: AC | PRN
  Administered 2019-08-26: 40 mg via INTRA_ARTICULAR

## 2019-08-26 MED ORDER — LIDOCAINE HCL 1 % IJ SOLN
5.0000 mL | INTRAMUSCULAR | Status: AC | PRN
  Administered 2019-08-26: 5 mL
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
## 2019-09-09 DIAGNOSIS — M79605 Pain in left leg: Secondary | ICD-10-CM | POA: Diagnosis not present

## 2019-09-09 DIAGNOSIS — G894 Chronic pain syndrome: Secondary | ICD-10-CM | POA: Diagnosis not present

## 2019-09-09 DIAGNOSIS — M170 Bilateral primary osteoarthritis of knee: Secondary | ICD-10-CM | POA: Diagnosis not present

## 2019-09-09 DIAGNOSIS — M545 Low back pain: Secondary | ICD-10-CM | POA: Diagnosis not present

## 2019-09-09 DIAGNOSIS — M79604 Pain in right leg: Secondary | ICD-10-CM | POA: Diagnosis not present

## 2019-09-19 ENCOUNTER — Other Ambulatory Visit: Payer: Self-pay

## 2019-09-19 ENCOUNTER — Emergency Department (HOSPITAL_COMMUNITY)
Admission: EM | Admit: 2019-09-19 | Discharge: 2019-09-19 | Disposition: A | Discharge status: Home / Self Care | Payer: Medicare Other | Attending: Emergency Medicine | Admitting: Emergency Medicine

## 2019-09-19 ENCOUNTER — Encounter (HOSPITAL_COMMUNITY): Payer: Self-pay

## 2019-09-19 DIAGNOSIS — I10 Essential (primary) hypertension: Secondary | ICD-10-CM | POA: Insufficient documentation

## 2019-09-19 DIAGNOSIS — Z7984 Long term (current) use of oral hypoglycemic drugs: Secondary | ICD-10-CM | POA: Insufficient documentation

## 2019-09-19 DIAGNOSIS — M545 Low back pain: Secondary | ICD-10-CM | POA: Diagnosis not present

## 2019-09-19 DIAGNOSIS — F17210 Nicotine dependence, cigarettes, uncomplicated: Secondary | ICD-10-CM | POA: Insufficient documentation

## 2019-09-19 DIAGNOSIS — M25562 Pain in left knee: Secondary | ICD-10-CM | POA: Diagnosis not present

## 2019-09-19 DIAGNOSIS — F250 Schizoaffective disorder, bipolar type: Secondary | ICD-10-CM | POA: Diagnosis not present

## 2019-09-19 DIAGNOSIS — E119 Type 2 diabetes mellitus without complications: Secondary | ICD-10-CM | POA: Insufficient documentation

## 2019-09-19 DIAGNOSIS — M25561 Pain in right knee: Secondary | ICD-10-CM | POA: Diagnosis not present

## 2019-09-19 DIAGNOSIS — Z79899 Other long term (current) drug therapy: Secondary | ICD-10-CM | POA: Insufficient documentation

## 2019-09-19 DIAGNOSIS — R52 Pain, unspecified: Secondary | ICD-10-CM | POA: Diagnosis not present

## 2019-09-19 DIAGNOSIS — G8929 Other chronic pain: Secondary | ICD-10-CM

## 2019-09-19 DIAGNOSIS — R0902 Hypoxemia: Secondary | ICD-10-CM | POA: Diagnosis not present

## 2019-09-19 DIAGNOSIS — T07XXXA Unspecified multiple injuries, initial encounter: Secondary | ICD-10-CM | POA: Diagnosis not present

## 2019-09-19 MED ORDER — KETOROLAC TROMETHAMINE 30 MG/ML IJ SOLN
30.0000 mg | Freq: Once | INTRAMUSCULAR | Status: AC
  Administered 2019-09-19: 30 mg via INTRAMUSCULAR
  Filled 2019-09-19: qty 1

## 2019-09-19 NOTE — ED Notes (Signed)
Pt here for pain control.
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
## 2019-09-22 ENCOUNTER — Ambulatory Visit (INDEPENDENT_AMBULATORY_CARE_PROVIDER_SITE_OTHER): Payer: Medicare Other | Admitting: Orthopedic Surgery

## 2019-09-22 ENCOUNTER — Encounter: Payer: Self-pay | Admitting: Orthopedic Surgery

## 2019-09-22 VITALS — Ht 72.0 in | Wt 237.0 lb

## 2019-09-22 DIAGNOSIS — M25512 Pain in left shoulder: Secondary | ICD-10-CM | POA: Diagnosis not present

## 2019-09-22 DIAGNOSIS — M75122 Complete rotator cuff tear or rupture of left shoulder, not specified as traumatic: Secondary | ICD-10-CM

## 2019-09-22 DIAGNOSIS — M25462 Effusion, left knee: Secondary | ICD-10-CM

## 2019-09-22 DIAGNOSIS — M170 Bilateral primary osteoarthritis of knee: Secondary | ICD-10-CM | POA: Diagnosis not present

## 2019-09-22 DIAGNOSIS — G8929 Other chronic pain: Secondary | ICD-10-CM
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
## 2019-09-24 ENCOUNTER — Telehealth: Payer: Self-pay

## 2019-09-24 ENCOUNTER — Telehealth: Payer: Self-pay | Admitting: Orthopedic Surgery

## 2019-09-24 NOTE — Telephone Encounter (Signed)
We can't get both at the same time. Will get the shoulder and then neck later.

## 2019-09-24 NOTE — Telephone Encounter (Signed)
Patient was called and informed that we will do the MRI of the shoulder first and he is satisfied with that at this time. He is advised that someone will be calling for an appt set-up for this.

## 2019-09-24 NOTE — Telephone Encounter (Signed)
Patient called stating that his Chiropractic doctor advised him that he needs to get an MRI on his neck not his shoulder and is requesting that the order be changed to his neck.  [phone].  Thank you.

## 2019-09-24 NOTE — Telephone Encounter (Signed)
Will call patient.

## 2019-09-24 NOTE — Telephone Encounter (Signed)
Pt called and states that he is being set up for an MRI of his shoulder and his PCP thinks this needs to be an MRI of his neck and wants this to be ordered as well. Please advise.

## 2019-10-03 ENCOUNTER — Ambulatory Visit
Admission: RE | Admit: 2019-10-03 | Discharge: 2019-10-03 | Disposition: A | Discharge status: Home / Self Care | Payer: Medicare Other | Source: Ambulatory Visit | Attending: Orthopedic Surgery | Admitting: Orthopedic Surgery

## 2019-10-03 DIAGNOSIS — M75112 Incomplete rotator cuff tear or rupture of left shoulder, not specified as traumatic: Secondary | ICD-10-CM | POA: Diagnosis not present

## 2019-10-03 DIAGNOSIS — M25512 Pain in left shoulder: Secondary | ICD-10-CM

## 2019-10-03 DIAGNOSIS — G8929 Other chronic pain: Secondary | ICD-10-CM

## 2019-10-07 ENCOUNTER — Ambulatory Visit (INDEPENDENT_AMBULATORY_CARE_PROVIDER_SITE_OTHER): Payer: Medicare Other | Admitting: Family

## 2019-10-07 ENCOUNTER — Other Ambulatory Visit: Payer: Self-pay

## 2019-10-07 ENCOUNTER — Ambulatory Visit (INDEPENDENT_AMBULATORY_CARE_PROVIDER_SITE_OTHER): Payer: Medicare Other | Admitting: Orthopedic Surgery

## 2019-10-07 ENCOUNTER — Encounter: Payer: Self-pay | Admitting: Family

## 2019-10-07 VITALS — Ht 72.0 in | Wt 237.0 lb

## 2019-10-07 DIAGNOSIS — M79604 Pain in right leg: Secondary | ICD-10-CM | POA: Diagnosis not present

## 2019-10-07 DIAGNOSIS — M170 Bilateral primary osteoarthritis of knee: Secondary | ICD-10-CM | POA: Diagnosis not present

## 2019-10-07 DIAGNOSIS — M75122 Complete rotator cuff tear or rupture of left shoulder, not specified as traumatic: Secondary | ICD-10-CM

## 2019-10-07 DIAGNOSIS — M545 Low back pain: Secondary | ICD-10-CM | POA: Diagnosis not present

## 2019-10-07 DIAGNOSIS — M79605 Pain in left leg: Secondary | ICD-10-CM | POA: Diagnosis not present

## 2019-10-07 DIAGNOSIS — G894 Chronic pain syndrome: Secondary | ICD-10-CM | POA: Diagnosis not present

## 2019-10-22 ENCOUNTER — Encounter: Payer: Self-pay | Admitting: Family

## 2019-10-22 ENCOUNTER — Encounter: Payer: Self-pay | Admitting: Orthopedic Surgery

## 2019-10-22 NOTE — Progress Notes (Signed)
Please see the initial entry for this patient.  Patient was initially marked as did not show however he did finally show the initial appointment has the exam
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
## 2019-10-27 ENCOUNTER — Telehealth: Payer: Self-pay | Admitting: Orthopedic Surgery

## 2019-10-27 NOTE — Telephone Encounter (Signed)
Sent message to scheduler to view. Will call patient with info when available.

## 2019-10-27 NOTE — Telephone Encounter (Signed)
Patient called and left a message on the voicemail. He would like to know if he will be having surgery and when. His call back number is [phone]

## 2019-10-30 DIAGNOSIS — J45909 Unspecified asthma, uncomplicated: Secondary | ICD-10-CM | POA: Diagnosis not present

## 2019-10-30 DIAGNOSIS — Z125 Encounter for screening for malignant neoplasm of prostate: Secondary | ICD-10-CM | POA: Diagnosis not present

## 2019-10-30 DIAGNOSIS — E669 Obesity, unspecified: Secondary | ICD-10-CM | POA: Diagnosis not present

## 2019-10-30 DIAGNOSIS — E1165 Type 2 diabetes mellitus with hyperglycemia: Secondary | ICD-10-CM | POA: Diagnosis not present

## 2019-10-30 DIAGNOSIS — E782 Mixed hyperlipidemia: Secondary | ICD-10-CM | POA: Diagnosis not present

## 2019-10-30 DIAGNOSIS — Z720 Tobacco use: Secondary | ICD-10-CM | POA: Diagnosis not present

## 2019-10-30 DIAGNOSIS — N528 Other male erectile dysfunction: Secondary | ICD-10-CM | POA: Diagnosis not present

## 2019-10-30 DIAGNOSIS — G894 Chronic pain syndrome: Secondary | ICD-10-CM | POA: Diagnosis not present

## 2019-10-30 DIAGNOSIS — J40 Bronchitis, not specified as acute or chronic: Secondary | ICD-10-CM | POA: Diagnosis not present

## 2019-10-30 DIAGNOSIS — I10 Essential (primary) hypertension: Secondary | ICD-10-CM | POA: Diagnosis not present

## 2019-10-30 DIAGNOSIS — E538 Deficiency of other specified B group vitamins: Secondary | ICD-10-CM | POA: Diagnosis not present

## 2019-11-04 DIAGNOSIS — M79604 Pain in right leg: Secondary | ICD-10-CM | POA: Diagnosis not present

## 2019-11-04 DIAGNOSIS — M170 Bilateral primary osteoarthritis of knee: Secondary | ICD-10-CM | POA: Diagnosis not present

## 2019-11-04 DIAGNOSIS — M79605 Pain in left leg: Secondary | ICD-10-CM | POA: Diagnosis not present

## 2019-11-04 DIAGNOSIS — M545 Low back pain: Secondary | ICD-10-CM | POA: Diagnosis not present

## 2019-11-04 DIAGNOSIS — G894 Chronic pain syndrome: Secondary | ICD-10-CM | POA: Diagnosis not present

## 2019-11-10 ENCOUNTER — Emergency Department (HOSPITAL_COMMUNITY)
Admission: EM | Admit: 2019-11-10 | Discharge: 2019-11-11 | Disposition: A | Discharge status: Home / Self Care | Payer: Medicare Other | Attending: Emergency Medicine | Admitting: Emergency Medicine

## 2019-11-10 ENCOUNTER — Other Ambulatory Visit: Payer: Self-pay | Admitting: Physician Assistant

## 2019-11-10 DIAGNOSIS — G894 Chronic pain syndrome: Secondary | ICD-10-CM | POA: Diagnosis not present

## 2019-11-10 DIAGNOSIS — R52 Pain, unspecified: Secondary | ICD-10-CM | POA: Diagnosis not present

## 2019-11-10 DIAGNOSIS — Z79899 Other long term (current) drug therapy: Secondary | ICD-10-CM | POA: Insufficient documentation

## 2019-11-10 DIAGNOSIS — M25512 Pain in left shoulder: Secondary | ICD-10-CM | POA: Diagnosis not present

## 2019-11-10 DIAGNOSIS — I10 Essential (primary) hypertension: Secondary | ICD-10-CM | POA: Insufficient documentation

## 2019-11-10 DIAGNOSIS — M25561 Pain in right knee: Secondary | ICD-10-CM | POA: Diagnosis not present

## 2019-11-10 DIAGNOSIS — Z7984 Long term (current) use of oral hypoglycemic drugs: Secondary | ICD-10-CM | POA: Diagnosis not present

## 2019-11-10 DIAGNOSIS — F17210 Nicotine dependence, cigarettes, uncomplicated: Secondary | ICD-10-CM | POA: Insufficient documentation

## 2019-11-10 DIAGNOSIS — F250 Schizoaffective disorder, bipolar type: Secondary | ICD-10-CM | POA: Insufficient documentation

## 2019-11-10 DIAGNOSIS — E119 Type 2 diabetes mellitus without complications: Secondary | ICD-10-CM | POA: Insufficient documentation

## 2019-11-10 DIAGNOSIS — M25562 Pain in left knee: Secondary | ICD-10-CM | POA: Diagnosis not present

## 2019-11-10 NOTE — ED Triage Notes (Signed)
Patient here from home, he ran out of his pain meds and MD from pain clinic will not re new his meds due to not having them in his urine test.

## 2019-11-11 ENCOUNTER — Other Ambulatory Visit: Payer: Self-pay

## 2019-11-11 ENCOUNTER — Encounter (HOSPITAL_COMMUNITY): Payer: Self-pay | Admitting: Emergency Medicine

## 2019-11-11 DIAGNOSIS — G894 Chronic pain syndrome: Secondary | ICD-10-CM | POA: Diagnosis not present

## 2019-11-11 MED ORDER — KETOROLAC TROMETHAMINE 30 MG/ML IJ SOLN
30.0000 mg | Freq: Once | INTRAMUSCULAR | Status: AC
  Administered 2019-11-11: 30 mg via INTRAMUSCULAR
  Filled 2019-11-11: qty 1

## 2019-11-11 MED ORDER — CYCLOBENZAPRINE HCL 10 MG PO TABS: 10.0000 mg | ORAL_TABLET | Freq: Two times a day (BID) | ORAL | 0 refills | Status: DC | PRN

## 2019-11-11 MED ORDER — CYCLOBENZAPRINE HCL 10 MG PO TABS
5.0000 mg | ORAL_TABLET | Freq: Once | ORAL | Status: AC
  Administered 2019-11-11: 5 mg via ORAL
  Filled 2019-11-11: qty 1
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
## 2019-11-11 NOTE — ED Notes (Signed)
Notified charge nurse CALLIE RN. About pt.bloodpresasure  176/109

## 2019-11-11 NOTE — Discharge Instructions (Signed)
Continue your home medications as previously prescribed. Return to the ED if you start to develop worsening symptoms, injuries or falls, numbness in arms or legs, chest pain or shortness of breath.

## 2019-11-12 ENCOUNTER — Encounter (HOSPITAL_COMMUNITY): Payer: Self-pay | Admitting: *Deleted

## 2019-11-12 NOTE — Progress Notes (Signed)
Patient denies shortness of breath, fever, cough and chest pain.  PCP - [facility] , Dr Theodosia Paling Cardiologist - denies  Chest x-ray - 08/21/19, 2 view EKG - 08/22/19 Stress Test - denies ECHO - 07/17/16 Cardiac Cath - denies  Fasting Blood Sugar - Unknown Checks Blood Sugar __0___ times a day Borderline but tx with metformin to prevent DM per patient.  . Do not take oral diabetes medicines (Metformin) the morning of surgery.  Anesthesia review: Yes  STOP nowtaking any Aspirin (unless otherwise instructed by your surgeon), Aleve, Naproxen, Ibuprofen, Motrin, Advil, Goody's, BC's, all herbal medications, fish oil, and all vitamins.   Coronavirus Screening Have you experienced the following symptoms:  Cough yes/no: No Fever (>100.49F)  yes/no: No Runny nose yes/no: No Sore throat yes/no: No Difficulty breathing/shortness of breath  yes/no: No  Have you traveled in the last 14 days and where? yes/no: No  Patient verbalized understanding of instructions that were given vai phone.

## 2019-11-13 ENCOUNTER — Other Ambulatory Visit (HOSPITAL_COMMUNITY)
Admission: RE | Admit: 2019-11-13 | Discharge: 2019-11-13 | Disposition: A | Discharge status: Home / Self Care | Payer: Medicare Other | Source: Ambulatory Visit | Attending: Orthopedic Surgery | Admitting: Orthopedic Surgery

## 2019-11-13 ENCOUNTER — Ambulatory Visit (HOSPITAL_COMMUNITY): Payer: Medicare Other | Admitting: Vascular Surgery

## 2019-11-13 DIAGNOSIS — Z01812 Encounter for preprocedural laboratory examination: Secondary | ICD-10-CM | POA: Diagnosis not present

## 2019-11-13 DIAGNOSIS — Z20828 Contact with and (suspected) exposure to other viral communicable diseases: Secondary | ICD-10-CM | POA: Diagnosis not present

## 2019-11-13 LAB — SARS CORONAVIRUS 2 (TAT 6-24 HRS): SARS Coronavirus 2: NEGATIVE
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
## 2019-11-13 NOTE — Progress Notes (Signed)
Patient will need UDS on DOS per Myra Gianotti, PA and Roderic Palau, MD.

## 2019-11-13 NOTE — Anesthesia Preprocedure Evaluation (Addendum)
Anesthesia Evaluation  Patient identified by MRN, date of birth, ID band Patient awake    Reviewed: Allergy & Precautions, NPO status , Patient's Chart, lab work & pertinent test results  Airway Mallampati: II  TM Distance: >3 FB Neck ROM: Full    Dental  (+) Dental Advisory Given, Poor Dentition, Missing   Pulmonary asthma , pneumonia, Current Smoker,   Tachypnic      rales    Cardiovascular hypertension, Pt. on medications  Rhythm:Regular Rate:Tachycardia     Neuro/Psych PSYCHIATRIC DISORDERS Anxiety Depression Bipolar Disorder Schizophrenia CVA    GI/Hepatic GERD  ,(+) Hepatitis -  Endo/Other  negative endocrine ROSdiabetes, Oral Hypoglycemic Agents  Renal/GU Renal disease     Musculoskeletal  (+) Arthritis ,   Abdominal (+) + obese,   Peds negative pediatric ROS (+)  Hematology negative hematology ROS (+)   Anesthesia Other Findings   Reproductive/Obstetrics                          Anesthesia Physical Anesthesia Plan  ASA: III  Anesthesia Plan: General   Post-op Pain Management: GA combined w/ Regional for post-op pain   Induction: Intravenous  PONV Risk Score and Plan: 2 and Ondansetron, Dexamethasone and Treatment may vary due to age or medical condition  Airway Management Planned: Oral ETT  Additional Equipment: None  Intra-op Plan:   Post-operative Plan: Extubation in OR  Informed Consent: I have reviewed the patients History and Physical, chart, labs and discussed the procedure including the risks, benefits and alternatives for the proposed anesthesia with the patient or authorized representative who has indicated his/her understanding and acceptance.     Dental advisory given  Plan Discussed with: CRNA  Anesthesia Plan Comments: (PAT note written 11/13/2019 by Myra Gianotti, PA-C. SAME DAY WORK-UP. For UDS on arrival.   )      Anesthesia Quick  Evaluation

## 2019-11-14 ENCOUNTER — Encounter (HOSPITAL_COMMUNITY)
Admission: RE | Disposition: A | Discharge status: Home / Self Care | Payer: Self-pay | Source: Home / Self Care | Attending: Orthopedic Surgery

## 2019-11-14 ENCOUNTER — Encounter (HOSPITAL_COMMUNITY): Payer: Self-pay

## 2019-11-14 ENCOUNTER — Other Ambulatory Visit: Payer: Self-pay

## 2019-11-14 ENCOUNTER — Ambulatory Visit (HOSPITAL_COMMUNITY)
Admission: RE | Admit: 2019-11-14 | Discharge: 2019-11-14 | Disposition: A | Discharge status: Home / Self Care | Payer: Medicare Other | Attending: Orthopedic Surgery | Admitting: Orthopedic Surgery

## 2019-11-14 DIAGNOSIS — J45909 Unspecified asthma, uncomplicated: Secondary | ICD-10-CM | POA: Insufficient documentation

## 2019-11-14 DIAGNOSIS — Z888 Allergy status to other drugs, medicaments and biological substances status: Secondary | ICD-10-CM | POA: Diagnosis not present

## 2019-11-14 DIAGNOSIS — G8929 Other chronic pain: Secondary | ICD-10-CM | POA: Insufficient documentation

## 2019-11-14 DIAGNOSIS — Z7951 Long term (current) use of inhaled steroids: Secondary | ICD-10-CM | POA: Diagnosis not present

## 2019-11-14 DIAGNOSIS — Z8673 Personal history of transient ischemic attack (TIA), and cerebral infarction without residual deficits: Secondary | ICD-10-CM | POA: Diagnosis not present

## 2019-11-14 DIAGNOSIS — Z7984 Long term (current) use of oral hypoglycemic drugs: Secondary | ICD-10-CM | POA: Diagnosis not present

## 2019-11-14 DIAGNOSIS — Z79891 Long term (current) use of opiate analgesic: Secondary | ICD-10-CM | POA: Insufficient documentation

## 2019-11-14 DIAGNOSIS — F17210 Nicotine dependence, cigarettes, uncomplicated: Secondary | ICD-10-CM | POA: Insufficient documentation

## 2019-11-14 DIAGNOSIS — I10 Essential (primary) hypertension: Secondary | ICD-10-CM | POA: Insufficient documentation

## 2019-11-14 DIAGNOSIS — E119 Type 2 diabetes mellitus without complications: Secondary | ICD-10-CM | POA: Insufficient documentation

## 2019-11-14 DIAGNOSIS — F209 Schizophrenia, unspecified: Secondary | ICD-10-CM | POA: Insufficient documentation

## 2019-11-14 DIAGNOSIS — Z79899 Other long term (current) drug therapy: Secondary | ICD-10-CM | POA: Insufficient documentation

## 2019-11-14 DIAGNOSIS — Z5309 Procedure and treatment not carried out because of other contraindication: Secondary | ICD-10-CM | POA: Diagnosis not present

## 2019-11-14 DIAGNOSIS — F319 Bipolar disorder, unspecified: Secondary | ICD-10-CM | POA: Diagnosis not present

## 2019-11-14 DIAGNOSIS — K219 Gastro-esophageal reflux disease without esophagitis: Secondary | ICD-10-CM | POA: Diagnosis not present

## 2019-11-14 DIAGNOSIS — Z791 Long term (current) use of non-steroidal anti-inflammatories (NSAID): Secondary | ICD-10-CM | POA: Insufficient documentation

## 2019-11-14 DIAGNOSIS — M25512 Pain in left shoulder: Secondary | ICD-10-CM | POA: Diagnosis present

## 2019-11-14 DIAGNOSIS — M75122 Complete rotator cuff tear or rupture of left shoulder, not specified as traumatic: Secondary | ICD-10-CM | POA: Diagnosis not present

## 2019-11-14 HISTORY — DX: Unspecified asthma, uncomplicated: J45.909

## 2019-11-14 HISTORY — DX: Inflammatory liver disease, unspecified: K75.9

## 2019-11-14 LAB — RAPID URINE DRUG SCREEN, HOSP PERFORMED
Amphetamines: NOT DETECTED
Barbiturates: NOT DETECTED
Benzodiazepines: NOT DETECTED
Cocaine: POSITIVE — AB
Opiates: NOT DETECTED
Tetrahydrocannabinol: NOT DETECTED

## 2019-11-14 LAB — GLUCOSE, CAPILLARY: Glucose-Capillary: 239 mg/dL — ABNORMAL HIGH (ref 70–99)

## 2019-11-14 SURGERY — ARTHROSCOPY, SHOULDER
Anesthesia: General | Site: Shoulder | Laterality: Left

## 2019-11-14 MED ORDER — PHENYLEPHRINE HCL-NACL 10-0.9 MG/250ML-% IV SOLN
INTRAVENOUS | Status: AC
  Filled 2019-11-14: qty 500

## 2019-11-14 MED ORDER — LACTATED RINGERS IV SOLN
INTRAVENOUS | Status: AC | PRN
  Administered 2019-11-14: 07:00:00 via INTRAVENOUS

## 2019-11-14 MED ORDER — CHLORHEXIDINE GLUCONATE 4 % EX LIQD: 60.0000 mL | Freq: Once | CUTANEOUS | Status: DC

## 2019-11-14 MED ORDER — ONDANSETRON HCL 4 MG/2ML IJ SOLN
INTRAMUSCULAR | Status: AC
  Filled 2019-11-14: qty 2

## 2019-11-14 MED ORDER — MIDAZOLAM HCL 2 MG/2ML IJ SOLN
INTRAMUSCULAR | Status: AC
  Filled 2019-11-14: qty 2

## 2019-11-14 MED ORDER — FENTANYL CITRATE (PF) 250 MCG/5ML IJ SOLN
INTRAMUSCULAR | Status: AC
  Filled 2019-11-14: qty 5

## 2019-11-14 MED ORDER — CEFAZOLIN SODIUM-DEXTROSE 2-4 GM/100ML-% IV SOLN
2.0000 g | INTRAVENOUS | Status: DC
  Filled 2019-11-14: qty 100

## 2019-11-14 MED ORDER — PROPOFOL 10 MG/ML IV BOLUS
INTRAVENOUS | Status: AC
  Filled 2019-11-14: qty 20

## 2019-11-14 MED ORDER — LIDOCAINE 2% (20 MG/ML) 5 ML SYRINGE
INTRAMUSCULAR | Status: AC
  Filled 2019-11-14: qty 5

## 2019-11-14 NOTE — Progress Notes (Signed)
Patient found positive for cocaine in urine tox screen.  Dr. Sharl Ma (Anesthesia) and Dr. Sharol Given (orthopedic surgeon) both advised and aware.  Case cancelled.  Dr. Sharol Given spoke w/patient at bedside and informed him that he will need to reschedule via office, and will need to remain clean to proceed with surgery.
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
## 2019-11-18 DIAGNOSIS — M79604 Pain in right leg: Secondary | ICD-10-CM | POA: Diagnosis not present

## 2019-11-18 DIAGNOSIS — G894 Chronic pain syndrome: Secondary | ICD-10-CM | POA: Diagnosis not present

## 2019-11-18 DIAGNOSIS — M79605 Pain in left leg: Secondary | ICD-10-CM | POA: Diagnosis not present

## 2019-11-18 DIAGNOSIS — M545 Low back pain: Secondary | ICD-10-CM | POA: Diagnosis not present

## 2019-11-18 DIAGNOSIS — M170 Bilateral primary osteoarthritis of knee: Secondary | ICD-10-CM | POA: Diagnosis not present

## 2019-11-19 ENCOUNTER — Telehealth: Payer: Self-pay

## 2019-11-19 NOTE — Telephone Encounter (Signed)
Patient would like to know what is the next step/option for him?  Stated that he was scheduled to have Left Shoulder surgery on 11/14/2019 with Dr. Sharol Given, but the surgery was canceled.  Cb# [phone].  Please advise.  Thank you.
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
## 2019-11-21 ENCOUNTER — Emergency Department (HOSPITAL_COMMUNITY): Payer: Medicare Other

## 2019-11-21 ENCOUNTER — Encounter (HOSPITAL_COMMUNITY): Payer: Self-pay | Admitting: Emergency Medicine

## 2019-11-21 ENCOUNTER — Emergency Department (HOSPITAL_COMMUNITY)
Admission: EM | Admit: 2019-11-21 | Discharge: 2019-11-21 | Disposition: A | Discharge status: Home / Self Care | Payer: Medicare Other | Attending: Emergency Medicine | Admitting: Emergency Medicine

## 2019-11-21 ENCOUNTER — Other Ambulatory Visit: Payer: Self-pay

## 2019-11-21 DIAGNOSIS — F17210 Nicotine dependence, cigarettes, uncomplicated: Secondary | ICD-10-CM | POA: Insufficient documentation

## 2019-11-21 DIAGNOSIS — Z8673 Personal history of transient ischemic attack (TIA), and cerebral infarction without residual deficits: Secondary | ICD-10-CM | POA: Insufficient documentation

## 2019-11-21 DIAGNOSIS — Z79899 Other long term (current) drug therapy: Secondary | ICD-10-CM | POA: Diagnosis not present

## 2019-11-21 DIAGNOSIS — I10 Essential (primary) hypertension: Secondary | ICD-10-CM | POA: Diagnosis not present

## 2019-11-21 DIAGNOSIS — F250 Schizoaffective disorder, bipolar type: Secondary | ICD-10-CM | POA: Diagnosis not present

## 2019-11-21 DIAGNOSIS — J45909 Unspecified asthma, uncomplicated: Secondary | ICD-10-CM | POA: Insufficient documentation

## 2019-11-21 DIAGNOSIS — E119 Type 2 diabetes mellitus without complications: Secondary | ICD-10-CM | POA: Diagnosis not present

## 2019-11-21 DIAGNOSIS — R079 Chest pain, unspecified: Secondary | ICD-10-CM | POA: Insufficient documentation

## 2019-11-21 DIAGNOSIS — Z7984 Long term (current) use of oral hypoglycemic drugs: Secondary | ICD-10-CM | POA: Diagnosis not present

## 2019-11-21 DIAGNOSIS — R0789 Other chest pain: Secondary | ICD-10-CM | POA: Diagnosis not present

## 2019-11-21 DIAGNOSIS — R0602 Shortness of breath: Secondary | ICD-10-CM | POA: Diagnosis not present

## 2019-11-21 LAB — URINALYSIS, ROUTINE W REFLEX MICROSCOPIC
Bilirubin Urine: NEGATIVE
Glucose, UA: NEGATIVE mg/dL
Hgb urine dipstick: NEGATIVE
Ketones, ur: 5 mg/dL — AB
Leukocytes,Ua: NEGATIVE
Nitrite: NEGATIVE
Protein, ur: NEGATIVE mg/dL
Specific Gravity, Urine: 1.01 (ref 1.005–1.030)
pH: 5 (ref 5.0–8.0)

## 2019-11-21 LAB — CBC WITH DIFFERENTIAL/PLATELET
Abs Immature Granulocytes: 0.16 10*3/uL — ABNORMAL HIGH (ref 0.00–0.07)
Basophils Absolute: 0.1 10*3/uL (ref 0.0–0.1)
Basophils Relative: 1 %
Eosinophils Absolute: 0.1 10*3/uL (ref 0.0–0.5)
Eosinophils Relative: 1 %
HCT: 40 % (ref 39.0–52.0)
Hemoglobin: 12.8 g/dL — ABNORMAL LOW (ref 13.0–17.0)
Immature Granulocytes: 1 %
Lymphocytes Relative: 25 %
Lymphs Abs: 2.8 10*3/uL (ref 0.7–4.0)
MCH: 28.4 pg (ref 26.0–34.0)
MCHC: 32 g/dL (ref 30.0–36.0)
MCV: 88.7 fL (ref 80.0–100.0)
Monocytes Absolute: 0.9 10*3/uL (ref 0.1–1.0)
Monocytes Relative: 8 %
Neutro Abs: 7.1 10*3/uL (ref 1.7–7.7)
Neutrophils Relative %: 64 %
Platelets: 330 10*3/uL (ref 150–400)
RBC: 4.51 MIL/uL (ref 4.22–5.81)
RDW: 13.3 % (ref 11.5–15.5)
WBC: 11 10*3/uL — ABNORMAL HIGH (ref 4.0–10.5)
nRBC: 0 % (ref 0.0–0.2)

## 2019-11-21 LAB — TROPONIN I (HIGH SENSITIVITY): Troponin I (High Sensitivity): 15 ng/L (ref <18)

## 2019-11-21 LAB — COMPREHENSIVE METABOLIC PANEL
ALT: 25 U/L (ref 0–44)
AST: 42 U/L — ABNORMAL HIGH (ref 15–41)
Albumin: 3.7 g/dL (ref 3.5–5.0)
Alkaline Phosphatase: 76 U/L (ref 38–126)
Anion gap: 14 (ref 5–15)
BUN: 8 mg/dL (ref 6–20)
CO2: 19 mmol/L — ABNORMAL LOW (ref 22–32)
Calcium: 8.8 mg/dL — ABNORMAL LOW (ref 8.9–10.3)
Chloride: 96 mmol/L — ABNORMAL LOW (ref 98–111)
Creatinine, Ser: 0.9 mg/dL (ref 0.61–1.24)
GFR calc Af Amer: >60 mL/min (ref >60)
GFR calc non Af Amer: >60 mL/min (ref >60)
Glucose, Bld: 162 mg/dL — ABNORMAL HIGH (ref 70–99)
Potassium: 4 mmol/L (ref 3.5–5.1)
Sodium: 129 mmol/L — ABNORMAL LOW (ref 135–145)
Total Bilirubin: 0.7 mg/dL (ref 0.3–1.2)
Total Protein: 6.9 g/dL (ref 6.5–8.1)

## 2019-11-21 LAB — RAPID URINE DRUG SCREEN, HOSP PERFORMED
Amphetamines: NOT DETECTED
Barbiturates: NOT DETECTED
Benzodiazepines: NOT DETECTED
Cocaine: NOT DETECTED
Opiates: NOT DETECTED
Tetrahydrocannabinol: NOT DETECTED

## 2019-11-21 LAB — ETHANOL: Alcohol, Ethyl (B): <10 mg/dL (ref <10)

## 2019-11-21 MED ORDER — LORAZEPAM 1 MG PO TABS
1.0000 mg | ORAL_TABLET | Freq: Once | ORAL | Status: AC
  Administered 2019-11-21: 1 mg via ORAL
  Filled 2019-11-21: qty 1

## 2019-11-21 MED ORDER — HYDROMORPHONE HCL 1 MG/ML IJ SOLN
1.0000 mg | Freq: Once | INTRAMUSCULAR | Status: AC
  Administered 2019-11-21: 1 mg via INTRAVENOUS
  Filled 2019-11-21: qty 1

## 2019-11-21 MED ORDER — SODIUM CHLORIDE 0.9 % IV BOLUS
1000.0000 mL | Freq: Once | INTRAVENOUS | Status: AC
  Administered 2019-11-21: 1000 mL via INTRAVENOUS

## 2019-11-21 MED ORDER — MORPHINE SULFATE (PF) 4 MG/ML IV SOLN
4.0000 mg | Freq: Once | INTRAVENOUS | Status: AC
  Administered 2019-11-21: 4 mg via INTRAVENOUS
  Filled 2019-11-21: qty 1

## 2019-11-21 MED ORDER — LORAZEPAM 2 MG/ML IJ SOLN
1.0000 mg | Freq: Once | INTRAMUSCULAR | Status: AC
  Administered 2019-11-21: 1 mg via INTRAVENOUS
  Filled 2019-11-21: qty 1

## 2019-11-21 NOTE — ED Notes (Signed)
Discharge instructions discussed with pt. Pt verbalized understanding. Pt stable and ambulatory. No signature pad available. 

## 2019-11-21 NOTE — BH Assessment (Signed)
[facility] Assessment Progress Note   Patient disposition given to Dr. Roslynn Amble.  Patient is psychiatrically cleared.
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
## 2019-11-21 NOTE — Discharge Instructions (Addendum)
Please follow-up with your primary doctor regarding symptoms you are experiencing today.  Please return to ER if you develop worsening chest pain, any difficulty breathing or other new concerns symptom.
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
## 2019-11-28 ENCOUNTER — Other Ambulatory Visit (INDEPENDENT_AMBULATORY_CARE_PROVIDER_SITE_OTHER): Payer: Self-pay | Admitting: Orthopedic Surgery

## 2019-11-28 NOTE — Telephone Encounter (Signed)
I called Jacob [name] to reschedule surgery.  It rang multiple times and there was no voicemail that picked up, so no way to leave message.  Will attempt calling again.

## 2019-12-25 ENCOUNTER — Other Ambulatory Visit (INDEPENDENT_AMBULATORY_CARE_PROVIDER_SITE_OTHER): Payer: Self-pay | Admitting: Orthopedic Surgery

## 2020-01-10 ENCOUNTER — Emergency Department (HOSPITAL_COMMUNITY): Payer: Medicare Other

## 2020-01-10 ENCOUNTER — Other Ambulatory Visit: Payer: Self-pay

## 2020-01-10 ENCOUNTER — Observation Stay (HOSPITAL_COMMUNITY)
Admission: EM | Admit: 2020-01-10 | Discharge: 2020-01-11 | Disposition: A | Discharge status: Home / Self Care | Payer: Medicare Other | Attending: Internal Medicine | Admitting: Internal Medicine

## 2020-01-10 ENCOUNTER — Observation Stay (HOSPITAL_COMMUNITY): Payer: Medicare Other

## 2020-01-10 ENCOUNTER — Encounter (HOSPITAL_COMMUNITY): Payer: Self-pay | Admitting: Emergency Medicine

## 2020-01-10 DIAGNOSIS — F1410 Cocaine abuse, uncomplicated: Secondary | ICD-10-CM | POA: Insufficient documentation

## 2020-01-10 DIAGNOSIS — R519 Headache, unspecified: Secondary | ICD-10-CM

## 2020-01-10 DIAGNOSIS — Z79899 Other long term (current) drug therapy: Secondary | ICD-10-CM | POA: Diagnosis not present

## 2020-01-10 DIAGNOSIS — M4856XA Collapsed vertebra, not elsewhere classified, lumbar region, initial encounter for fracture: Secondary | ICD-10-CM | POA: Insufficient documentation

## 2020-01-10 DIAGNOSIS — M109 Gout, unspecified: Secondary | ICD-10-CM | POA: Insufficient documentation

## 2020-01-10 DIAGNOSIS — K219 Gastro-esophageal reflux disease without esophagitis: Secondary | ICD-10-CM | POA: Insufficient documentation

## 2020-01-10 DIAGNOSIS — R531 Weakness: Secondary | ICD-10-CM

## 2020-01-10 DIAGNOSIS — I10 Essential (primary) hypertension: Secondary | ICD-10-CM | POA: Diagnosis not present

## 2020-01-10 DIAGNOSIS — F17210 Nicotine dependence, cigarettes, uncomplicated: Secondary | ICD-10-CM | POA: Insufficient documentation

## 2020-01-10 DIAGNOSIS — E669 Obesity, unspecified: Secondary | ICD-10-CM | POA: Insufficient documentation

## 2020-01-10 DIAGNOSIS — R2681 Unsteadiness on feet: Secondary | ICD-10-CM | POA: Diagnosis not present

## 2020-01-10 DIAGNOSIS — Z20822 Contact with and (suspected) exposure to covid-19: Secondary | ICD-10-CM | POA: Diagnosis not present

## 2020-01-10 DIAGNOSIS — Z6831 Body mass index (BMI) 31.0-31.9, adult: Secondary | ICD-10-CM | POA: Insufficient documentation

## 2020-01-10 DIAGNOSIS — J45909 Unspecified asthma, uncomplicated: Secondary | ICD-10-CM | POA: Insufficient documentation

## 2020-01-10 DIAGNOSIS — M549 Dorsalgia, unspecified: Secondary | ICD-10-CM | POA: Diagnosis not present

## 2020-01-10 DIAGNOSIS — G459 Transient cerebral ischemic attack, unspecified: Secondary | ICD-10-CM | POA: Diagnosis not present

## 2020-01-10 DIAGNOSIS — D72829 Elevated white blood cell count, unspecified: Secondary | ICD-10-CM | POA: Insufficient documentation

## 2020-01-10 DIAGNOSIS — E119 Type 2 diabetes mellitus without complications: Secondary | ICD-10-CM | POA: Diagnosis not present

## 2020-01-10 DIAGNOSIS — F259 Schizoaffective disorder, unspecified: Secondary | ICD-10-CM | POA: Diagnosis present

## 2020-01-10 DIAGNOSIS — Z8673 Personal history of transient ischemic attack (TIA), and cerebral infarction without residual deficits: Secondary | ICD-10-CM | POA: Diagnosis not present

## 2020-01-10 DIAGNOSIS — E871 Hypo-osmolality and hyponatremia: Secondary | ICD-10-CM | POA: Diagnosis not present

## 2020-01-10 DIAGNOSIS — R29810 Facial weakness: Secondary | ICD-10-CM

## 2020-01-10 DIAGNOSIS — Z791 Long term (current) use of non-steroidal anti-inflammatories (NSAID): Secondary | ICD-10-CM | POA: Insufficient documentation

## 2020-01-10 DIAGNOSIS — R000 Tachycardia, unspecified: Secondary | ICD-10-CM | POA: Diagnosis not present

## 2020-01-10 DIAGNOSIS — Z7984 Long term (current) use of oral hypoglycemic drugs: Secondary | ICD-10-CM | POA: Insufficient documentation

## 2020-01-10 DIAGNOSIS — R0902 Hypoxemia: Secondary | ICD-10-CM | POA: Diagnosis not present

## 2020-01-10 DIAGNOSIS — E785 Hyperlipidemia, unspecified: Secondary | ICD-10-CM | POA: Insufficient documentation

## 2020-01-10 DIAGNOSIS — G894 Chronic pain syndrome: Secondary | ICD-10-CM | POA: Diagnosis present

## 2020-01-10 DIAGNOSIS — R29898 Other symptoms and signs involving the musculoskeletal system: Secondary | ICD-10-CM | POA: Insufficient documentation

## 2020-01-10 DIAGNOSIS — F250 Schizoaffective disorder, bipolar type: Secondary | ICD-10-CM | POA: Insufficient documentation

## 2020-01-10 DIAGNOSIS — E1165 Type 2 diabetes mellitus with hyperglycemia: Secondary | ICD-10-CM | POA: Diagnosis not present

## 2020-01-10 DIAGNOSIS — Z7951 Long term (current) use of inhaled steroids: Secondary | ICD-10-CM | POA: Insufficient documentation

## 2020-01-10 DIAGNOSIS — R29818 Other symptoms and signs involving the nervous system: Secondary | ICD-10-CM | POA: Diagnosis not present

## 2020-01-10 LAB — I-STAT CHEM 8, ED
BUN: 28 mg/dL — ABNORMAL HIGH (ref 6–20)
Calcium, Ion: 1.01 mmol/L — ABNORMAL LOW (ref 1.15–1.40)
Chloride: 102 mmol/L (ref 98–111)
Creatinine, Ser: 0.8 mg/dL (ref 0.61–1.24)
Glucose, Bld: 197 mg/dL — ABNORMAL HIGH (ref 70–99)
HCT: 45 % (ref 39.0–52.0)
Hemoglobin: 15.3 g/dL (ref 13.0–17.0)
Potassium: 4.2 mmol/L (ref 3.5–5.1)
Sodium: 132 mmol/L — ABNORMAL LOW (ref 135–145)
TCO2: 21 mmol/L — ABNORMAL LOW (ref 22–32)

## 2020-01-10 LAB — DIFFERENTIAL
Abs Immature Granulocytes: 0.34 10*3/uL — ABNORMAL HIGH (ref 0.00–0.07)
Basophils Absolute: 0.1 10*3/uL (ref 0.0–0.1)
Basophils Relative: 1 %
Eosinophils Absolute: 0.1 10*3/uL (ref 0.0–0.5)
Eosinophils Relative: 1 %
Immature Granulocytes: 2 %
Lymphocytes Relative: 31 %
Lymphs Abs: 4.8 10*3/uL — ABNORMAL HIGH (ref 0.7–4.0)
Monocytes Absolute: 1 10*3/uL (ref 0.1–1.0)
Monocytes Relative: 7 %
Neutro Abs: 9.3 10*3/uL — ABNORMAL HIGH (ref 1.7–7.7)
Neutrophils Relative %: 58 %

## 2020-01-10 LAB — COMPREHENSIVE METABOLIC PANEL
ALT: 25 U/L (ref 0–44)
AST: 26 U/L (ref 15–41)
Albumin: 3.9 g/dL (ref 3.5–5.0)
Alkaline Phosphatase: 79 U/L (ref 38–126)
Anion gap: 16 — ABNORMAL HIGH (ref 5–15)
BUN: 21 mg/dL — ABNORMAL HIGH (ref 6–20)
CO2: 17 mmol/L — ABNORMAL LOW (ref 22–32)
Calcium: 8.5 mg/dL — ABNORMAL LOW (ref 8.9–10.3)
Chloride: 99 mmol/L (ref 98–111)
Creatinine, Ser: 0.87 mg/dL (ref 0.61–1.24)
GFR calc Af Amer: >60 mL/min (ref >60)
GFR calc non Af Amer: >60 mL/min (ref >60)
Glucose, Bld: 199 mg/dL — ABNORMAL HIGH (ref 70–99)
Potassium: 4.4 mmol/L (ref 3.5–5.1)
Sodium: 132 mmol/L — ABNORMAL LOW (ref 135–145)
Total Bilirubin: 0.7 mg/dL (ref 0.3–1.2)
Total Protein: 6.8 g/dL (ref 6.5–8.1)

## 2020-01-10 LAB — CBC
HCT: 43.8 % (ref 39.0–52.0)
Hemoglobin: 13.9 g/dL (ref 13.0–17.0)
MCH: 28.3 pg (ref 26.0–34.0)
MCHC: 31.7 g/dL (ref 30.0–36.0)
MCV: 89 fL (ref 80.0–100.0)
Platelets: 370 10*3/uL (ref 150–400)
RBC: 4.92 MIL/uL (ref 4.22–5.81)
RDW: 13.9 % (ref 11.5–15.5)
WBC: 15.8 10*3/uL — ABNORMAL HIGH (ref 4.0–10.5)
nRBC: 0 % (ref 0.0–0.2)

## 2020-01-10 LAB — PROTIME-INR
INR: 0.9 (ref 0.8–1.2)
Prothrombin Time: 11.9 seconds (ref 11.4–15.2)

## 2020-01-10 LAB — CBG MONITORING, ED: Glucose-Capillary: 209 mg/dL — ABNORMAL HIGH (ref 70–99)

## 2020-01-10 LAB — APTT: aPTT: 30 seconds (ref 24–36)

## 2020-01-10 MED ORDER — FENTANYL CITRATE (PF) 100 MCG/2ML IJ SOLN
50.0000 ug | Freq: Once | INTRAMUSCULAR | Status: AC
  Administered 2020-01-10: 50 ug via INTRAVENOUS
  Filled 2020-01-10: qty 2

## 2020-01-10 MED ORDER — SODIUM CHLORIDE 0.9 % IV SOLN: INTRAVENOUS | Status: DC

## 2020-01-10 MED ORDER — MORPHINE SULFATE (PF) 4 MG/ML IV SOLN
4.0000 mg | Freq: Once | INTRAVENOUS | Status: AC
  Administered 2020-01-11: 4 mg via INTRAVENOUS
  Filled 2020-01-10: qty 1

## 2020-01-10 MED ORDER — ACETAMINOPHEN 160 MG/5ML PO SOLN: 650.0000 mg | ORAL | Status: DC | PRN

## 2020-01-10 MED ORDER — ENOXAPARIN SODIUM 40 MG/0.4ML ~~LOC~~ SOLN
40.0000 mg | Freq: Every day | SUBCUTANEOUS | Status: DC
  Administered 2020-01-11: 40 mg via SUBCUTANEOUS
  Filled 2020-01-10: qty 0.4

## 2020-01-10 MED ORDER — INSULIN ASPART 100 UNIT/ML ~~LOC~~ SOLN
0.0000 [IU] | SUBCUTANEOUS | Status: DC
  Administered 2020-01-11: 3 [IU] via SUBCUTANEOUS
  Administered 2020-01-11: 2 [IU] via SUBCUTANEOUS
  Administered 2020-01-11: 1 [IU] via SUBCUTANEOUS

## 2020-01-10 MED ORDER — SODIUM CHLORIDE 0.9% FLUSH
3.0000 mL | Freq: Once | INTRAVENOUS | Status: AC
Start: 2020-01-10 — End: 2020-01-10
  Administered 2020-01-10: 3 mL via INTRAVENOUS

## 2020-01-10 MED ORDER — ACETAMINOPHEN 325 MG PO TABS: 650.0000 mg | ORAL_TABLET | ORAL | Status: DC | PRN

## 2020-01-10 MED ORDER — ASPIRIN 300 MG RE SUPP: 300.0000 mg | Freq: Every day | RECTAL | Status: DC

## 2020-01-10 MED ORDER — STROKE: EARLY STAGES OF RECOVERY BOOK: Freq: Once | Status: DC

## 2020-01-10 MED ORDER — ACETAMINOPHEN 650 MG RE SUPP: 650.0000 mg | RECTAL | Status: DC | PRN

## 2020-01-10 MED ORDER — ASPIRIN 325 MG PO TABS
325.0000 mg | ORAL_TABLET | Freq: Every day | ORAL | Status: DC
  Administered 2020-01-11: 325 mg via ORAL
  Filled 2020-01-10: qty 1
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
## 2020-01-10 NOTE — ED Notes (Signed)
MRI called and made aware of need for MRI

## 2020-01-10 NOTE — ED Notes (Signed)
Pt c/o dehydration, pain, and hunger. Dr. Sabra Heck aware.
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
## 2020-01-11 ENCOUNTER — Observation Stay (HOSPITAL_COMMUNITY): Payer: Medicare Other

## 2020-01-11 DIAGNOSIS — R29810 Facial weakness: Secondary | ICD-10-CM | POA: Diagnosis not present

## 2020-01-11 DIAGNOSIS — G459 Transient cerebral ischemic attack, unspecified: Secondary | ICD-10-CM | POA: Diagnosis not present

## 2020-01-11 LAB — URINALYSIS, ROUTINE W REFLEX MICROSCOPIC
Bilirubin Urine: NEGATIVE
Glucose, UA: NEGATIVE mg/dL
Hgb urine dipstick: NEGATIVE
Ketones, ur: NEGATIVE mg/dL
Leukocytes,Ua: NEGATIVE
Nitrite: NEGATIVE
Protein, ur: NEGATIVE mg/dL
Specific Gravity, Urine: 1.008 (ref 1.005–1.030)
pH: 5 (ref 5.0–8.0)

## 2020-01-11 LAB — LIPID PANEL
Cholesterol: 123 mg/dL (ref 0–200)
HDL: 32 mg/dL — ABNORMAL LOW (ref >40)
LDL Cholesterol: 33 mg/dL (ref 0–99)
Total CHOL/HDL Ratio: 3.8 RATIO
Triglycerides: 289 mg/dL — ABNORMAL HIGH (ref <150)
VLDL: 58 mg/dL — ABNORMAL HIGH (ref 0–40)

## 2020-01-11 LAB — SARS CORONAVIRUS 2 (TAT 6-24 HRS): SARS Coronavirus 2: NEGATIVE

## 2020-01-11 LAB — CBC
HCT: 41.2 % (ref 39.0–52.0)
Hemoglobin: 12.8 g/dL — ABNORMAL LOW (ref 13.0–17.0)
MCH: 27.7 pg (ref 26.0–34.0)
MCHC: 31.1 g/dL (ref 30.0–36.0)
MCV: 89.2 fL (ref 80.0–100.0)
Platelets: 346 10*3/uL (ref 150–400)
RBC: 4.62 MIL/uL (ref 4.22–5.81)
RDW: 14.1 % (ref 11.5–15.5)
WBC: 12.1 10*3/uL — ABNORMAL HIGH (ref 4.0–10.5)
nRBC: 0 % (ref 0.0–0.2)

## 2020-01-11 LAB — LACTIC ACID, PLASMA
Lactic Acid, Venous: 3 mmol/L (ref 0.5–1.9)
Lactic Acid, Venous: 2.4 mmol/L (ref 0.5–1.9)

## 2020-01-11 LAB — RAPID URINE DRUG SCREEN, HOSP PERFORMED
Amphetamines: NOT DETECTED
Barbiturates: NOT DETECTED
Benzodiazepines: NOT DETECTED
Cocaine: POSITIVE — AB
Opiates: NOT DETECTED
Tetrahydrocannabinol: NOT DETECTED

## 2020-01-11 LAB — BASIC METABOLIC PANEL
Anion gap: 13 (ref 5–15)
BUN: 13 mg/dL (ref 6–20)
CO2: 23 mmol/L (ref 22–32)
Calcium: 8.7 mg/dL — ABNORMAL LOW (ref 8.9–10.3)
Chloride: 101 mmol/L (ref 98–111)
Creatinine, Ser: 0.91 mg/dL (ref 0.61–1.24)
GFR calc Af Amer: >60 mL/min (ref >60)
GFR calc non Af Amer: >60 mL/min (ref >60)
Glucose, Bld: 232 mg/dL — ABNORMAL HIGH (ref 70–99)
Potassium: 3.8 mmol/L (ref 3.5–5.1)
Sodium: 137 mmol/L (ref 135–145)

## 2020-01-11 LAB — CBG MONITORING, ED
Glucose-Capillary: 150 mg/dL — ABNORMAL HIGH (ref 70–99)
Glucose-Capillary: 253 mg/dL — ABNORMAL HIGH (ref 70–99)
Glucose-Capillary: 188 mg/dL — ABNORMAL HIGH (ref 70–99)

## 2020-01-11 LAB — HEMOGLOBIN A1C
Hgb A1c MFr Bld: 8.1 % — ABNORMAL HIGH (ref 4.8–5.6)
Mean Plasma Glucose: 185.77 mg/dL

## 2020-01-11 MED ORDER — METFORMIN HCL 500 MG PO TABS
500.0000 mg | ORAL_TABLET | Freq: Two times a day (BID) | ORAL | Status: DC
  Administered 2020-01-11: 500 mg via ORAL
  Filled 2020-01-11: qty 1

## 2020-01-11 MED ORDER — AMLODIPINE BESYLATE 10 MG PO TABS: 10.0000 mg | ORAL_TABLET | Freq: Every day | ORAL | 0 refills | Status: DC

## 2020-01-11 MED ORDER — ATORVASTATIN CALCIUM 10 MG PO TABS
10.0000 mg | ORAL_TABLET | Freq: Every day | ORAL | Status: DC
  Administered 2020-01-11: 10 mg via ORAL
  Filled 2020-01-11: qty 1

## 2020-01-11 MED ORDER — FOLIC ACID 1 MG PO TABS
1.0000 mg | ORAL_TABLET | Freq: Every day | ORAL | Status: DC
  Administered 2020-01-11: 1 mg via ORAL
  Filled 2020-01-11: qty 1

## 2020-01-11 MED ORDER — LISINOPRIL 20 MG PO TABS
20.0000 mg | ORAL_TABLET | Freq: Every day | ORAL | Status: DC
  Administered 2020-01-11: 20 mg via ORAL
  Filled 2020-01-11: qty 1

## 2020-01-11 MED ORDER — ALBUTEROL SULFATE HFA 108 (90 BASE) MCG/ACT IN AERS: 2.0000 | INHALATION_SPRAY | Freq: Four times a day (QID) | RESPIRATORY_TRACT | Status: DC | PRN

## 2020-01-11 MED ORDER — AMLODIPINE BESYLATE 5 MG PO TABS
10.0000 mg | ORAL_TABLET | Freq: Every day | ORAL | Status: DC
  Administered 2020-01-11: 10 mg via ORAL
  Filled 2020-01-11: qty 2

## 2020-01-11 MED ORDER — VALACYCLOVIR HCL 500 MG PO TABS
1000.0000 mg | ORAL_TABLET | Freq: Three times a day (TID) | ORAL | Status: DC
  Administered 2020-01-11: 1000 mg via ORAL
  Filled 2020-01-11: qty 2

## 2020-01-11 MED ORDER — BENZTROPINE MESYLATE 1 MG PO TABS
1.0000 mg | ORAL_TABLET | Freq: Every day | ORAL | Status: DC
  Administered 2020-01-11: 1 mg via ORAL
  Filled 2020-01-11: qty 1

## 2020-01-11 MED ORDER — METFORMIN HCL 1000 MG PO TABS: 1000.0000 mg | ORAL_TABLET | Freq: Two times a day (BID) | ORAL | 0 refills | Status: DC

## 2020-01-11 MED ORDER — HYDROCODONE-ACETAMINOPHEN 10-325 MG PO TABS
1.0000 | ORAL_TABLET | Freq: Three times a day (TID) | ORAL | Status: DC | PRN
  Administered 2020-01-11 (×2): 1 via ORAL
  Filled 2020-01-11 (×2): qty 1

## 2020-01-11 MED ORDER — VALACYCLOVIR HCL 1 G PO TABS: 1000.0000 mg | ORAL_TABLET | Freq: Three times a day (TID) | ORAL | 0 refills | Status: DC

## 2020-01-11 MED ORDER — DOXEPIN HCL 10 MG PO CAPS
10.0000 mg | ORAL_CAPSULE | Freq: Every day | ORAL | Status: DC
  Administered 2020-01-11: 10 mg via ORAL
  Filled 2020-01-11 (×2): qty 1

## 2020-01-11 MED ORDER — METFORMIN HCL 500 MG PO TABS: 1000.0000 mg | ORAL_TABLET | Freq: Two times a day (BID) | ORAL | Status: DC

## 2020-01-11 MED ORDER — DICLOFENAC SODIUM 1 % EX GEL: 4.0000 g | Freq: Four times a day (QID) | CUTANEOUS | Status: DC | PRN

## 2020-01-11 MED ORDER — PANTOPRAZOLE SODIUM 40 MG PO TBEC
40.0000 mg | DELAYED_RELEASE_TABLET | Freq: Every day | ORAL | Status: DC
  Administered 2020-01-11: 40 mg via ORAL
  Filled 2020-01-11: qty 1

## 2020-01-11 MED ORDER — LORATADINE 10 MG PO TABS
10.0000 mg | ORAL_TABLET | Freq: Every day | ORAL | Status: DC
  Administered 2020-01-11: 10 mg via ORAL
  Filled 2020-01-11: qty 1

## 2020-01-11 MED ORDER — MOMETASONE FURO-FORMOTEROL FUM 100-5 MCG/ACT IN AERO
2.0000 | INHALATION_SPRAY | Freq: Two times a day (BID) | RESPIRATORY_TRACT | Status: DC
  Filled 2020-01-11: qty 8.8

## 2020-01-11 MED ORDER — FLUTICASONE PROPIONATE 50 MCG/ACT NA SUSP
2.0000 | Freq: Every day | NASAL | Status: DC
  Filled 2020-01-11: qty 16

## 2020-01-11 MED ORDER — BENZTROPINE MESYLATE 1 MG PO TABS: 1.0000 mg | ORAL_TABLET | Freq: Every day | ORAL | Status: DC

## 2020-01-11 MED ORDER — RISPERIDONE 3 MG PO TABS
4.0000 mg | ORAL_TABLET | Freq: Every day | ORAL | Status: DC
  Administered 2020-01-11: 4 mg via ORAL
  Filled 2020-01-11: qty 2

## 2020-01-11 MED ORDER — MORPHINE SULFATE (PF) 4 MG/ML IV SOLN
2.0000 mg | Freq: Once | INTRAVENOUS | Status: AC
  Administered 2020-01-11: 2 mg via INTRAVENOUS
  Filled 2020-01-11: qty 1

## 2020-01-11 MED ORDER — PREDNISONE 20 MG PO TABS
50.0000 mg | ORAL_TABLET | Freq: Once | ORAL | Status: AC
  Administered 2020-01-11: 50 mg via ORAL
  Filled 2020-01-11: qty 3

## 2020-01-11 MED ORDER — SODIUM CHLORIDE 0.9 % IV SOLN: INTRAVENOUS | Status: DC

## 2020-01-11 MED ORDER — METHOCARBAMOL 500 MG PO TABS: 500.0000 mg | ORAL_TABLET | Freq: Two times a day (BID) | ORAL | Status: DC | PRN

## 2020-01-11 MED ORDER — LORAZEPAM 1 MG PO TABS
0.5000 mg | ORAL_TABLET | Freq: Two times a day (BID) | ORAL | Status: DC
  Administered 2020-01-11 (×2): 1 mg via ORAL
  Filled 2020-01-11 (×2): qty 1

## 2020-01-11 MED ORDER — VENLAFAXINE HCL ER 150 MG PO CP24
300.0000 mg | ORAL_CAPSULE | Freq: Every day | ORAL | Status: DC
  Administered 2020-01-11: 300 mg via ORAL
  Filled 2020-01-11: qty 2

## 2020-01-11 MED ORDER — ZOLPIDEM TARTRATE 5 MG PO TABS
10.0000 mg | ORAL_TABLET | Freq: Every evening | ORAL | Status: DC | PRN
  Administered 2020-01-11: 10 mg via ORAL
  Filled 2020-01-11: qty 2

## 2020-01-11 NOTE — ED Notes (Signed)
Patient verbalizes understanding of discharge instructions. Opportunity for questioning and answers were provided.  pt discharged from ED, ambulatory by self

## 2020-01-11 NOTE — Evaluation (Signed)
Physical Therapy Evaluation Patient Details Name: Jacob [name] MRN: [id number] DOB: [birth year] Today's Date: 01/11/2020   History of Present Illness  Jacob [name] is an 54 y.o. male with a PMHx of Bell's palsy (2017), HTN and DM who presented as a code stroke for facial droop and left sided weakness. MRI showing no acute abnormality. Urine drug screen positive for cocaine.   Clinical Impression  Pt appears to be close to his functional baseline. Prior to admission, pt lives with his brother, ambulates with a cane and has an aide who assists with IADL's. He endorses several falls. Pt present with decreased functional use of LUE, gait abnormalities, balance impairments, and left facial droop. Pt ambulating hallway distances without an assistive device with no physical assist required. Pt declining need for PT follow up.     Follow Up Recommendations No PT follow up    Equipment Recommendations  None recommended by PT    Recommendations for Other Services       Precautions / Restrictions Precautions Precautions: Fall Restrictions Weight Bearing Restrictions: No      Mobility  Bed Mobility Overal bed mobility: Modified Independent                Transfers Overall transfer level: Modified independent Equipment used: None                Ambulation/Gait Ambulation/Gait assistance: Modified independent (Device/Increase time) Gait Distance (Feet): 200 Feet Assistive device: None Gait Pattern/deviations: Step-through pattern;Wide base of support     General Gait Details: Cues for shorter step length   Stairs            Wheelchair Mobility    Modified Rankin (Stroke Patients Only)       Balance Overall balance assessment: Mild deficits observed, not formally tested                                           Pertinent Vitals/Pain Pain Assessment: Faces Faces Pain Scale: Hurts little more Pain Location: back Pain  Descriptors / Indicators: Discomfort Pain Intervention(s): Monitored during session;Patient requesting pain meds-RN notified    Home Living Family/patient expects to be discharged to:: Private residence Living Arrangements: Other relatives(brother) Available Help at Discharge: Family;Available PRN/intermittently Type of Home: House Home Access: Stairs to enter Entrance Stairs-Rails: Can reach both Entrance Stairs-Number of Steps: 3 Home Layout: One level Home Equipment: Cane - single point      Prior Function Level of Independence: Needs assistance   Gait / Transfers Assistance Needed: uses cane for mobility  ADL's / Homemaking Assistance Needed: has aide 7 days/wk; assists with IADL's, bathing  Comments: reports 3-4 falls within past month     Hand Dominance        Extremity/Trunk Assessment   Upper Extremity Assessment Upper Extremity Assessment: RUE deficits/detail;LUE deficits/detail RUE Deficits / Details: WFL LUE Deficits / Details: Hx of rotator cuff tear; shoulder flexion AROM to ~30 degrees. Muscular atrophy noted    Lower Extremity Assessment Lower Extremity Assessment: Overall WFL for tasks assessed       Communication   Communication: No difficulties  Cognition Arousal/Alertness: Awake/alert Behavior During Therapy: WFL for tasks assessed/performed Overall Cognitive Status: Within Functional Limits for tasks assessed  General Comments      Exercises     Assessment/Plan    PT Assessment Patent does not need any further PT services  PT Problem List         PT Treatment Interventions      PT Goals (Current goals can be found in the Care Plan section)  Acute Rehab PT Goals Patient Stated Goal: not have facial twitching PT Goal Formulation: All assessment and education complete, DC therapy    Frequency     Barriers to discharge        Co-evaluation               AM-PAC PT  "6 Clicks" Mobility  Outcome Measure Help needed turning from your back to your side while in a flat bed without using bedrails?: None Help needed moving from lying on your back to sitting on the side of a flat bed without using bedrails?: None Help needed moving to and from a bed to a chair (including a wheelchair)?: None Help needed standing up from a chair using your arms (e.g., wheelchair or bedside chair)?: None Help needed to walk in hospital room?: None Help needed climbing 3-5 steps with a railing? : A Little 6 Click Score: 23    End of Session Equipment Utilized During Treatment: Gait belt Activity Tolerance: Patient tolerated treatment well Patient left: in bed;with call bell/phone within reach Nurse Communication: Mobility status PT Visit Diagnosis: Unsteadiness on feet (R26.81);Other abnormalities of gait and mobility (R26.89);Difficulty in walking, not elsewhere classified (R26.2);Other symptoms and signs involving the nervous system (R29.898)    Time: 5397-6734 PT Time Calculation (min) (ACUTE ONLY): 28 min   Charges:   PT Evaluation $PT Eval Moderate Complexity: 1 Mod PT Treatments $Therapeutic Activity: 8-22 mins       Ellamae Sia, PT, DPT Acute Rehabilitation Services Pager [phone] Office [phone]   Willy Eddy 01/11/2020, 2:43 PM
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
## 2020-01-14 DIAGNOSIS — I10 Essential (primary) hypertension: Secondary | ICD-10-CM | POA: Diagnosis not present

## 2020-01-14 DIAGNOSIS — G894 Chronic pain syndrome: Secondary | ICD-10-CM | POA: Diagnosis not present

## 2020-01-14 DIAGNOSIS — N528 Other male erectile dysfunction: Secondary | ICD-10-CM | POA: Diagnosis not present

## 2020-01-14 DIAGNOSIS — J45909 Unspecified asthma, uncomplicated: Secondary | ICD-10-CM | POA: Diagnosis not present

## 2020-01-14 DIAGNOSIS — E782 Mixed hyperlipidemia: Secondary | ICD-10-CM | POA: Diagnosis not present

## 2020-01-14 DIAGNOSIS — Z720 Tobacco use: Secondary | ICD-10-CM | POA: Diagnosis not present

## 2020-01-14 DIAGNOSIS — E538 Deficiency of other specified B group vitamins: Secondary | ICD-10-CM | POA: Diagnosis not present

## 2020-01-14 DIAGNOSIS — E1165 Type 2 diabetes mellitus with hyperglycemia: Secondary | ICD-10-CM | POA: Diagnosis not present

## 2020-01-14 DIAGNOSIS — E669 Obesity, unspecified: Secondary | ICD-10-CM | POA: Diagnosis not present

## 2020-01-19 ENCOUNTER — Ambulatory Visit (INDEPENDENT_AMBULATORY_CARE_PROVIDER_SITE_OTHER): Payer: Medicare Other | Admitting: Orthopedic Surgery

## 2020-01-19 ENCOUNTER — Encounter: Payer: Self-pay | Admitting: Orthopedic Surgery

## 2020-01-19 ENCOUNTER — Other Ambulatory Visit: Payer: Self-pay

## 2020-01-19 VITALS — Ht 73.0 in | Wt 242.5 lb

## 2020-01-19 DIAGNOSIS — M75122 Complete rotator cuff tear or rupture of left shoulder, not specified as traumatic: Secondary | ICD-10-CM | POA: Diagnosis not present

## 2020-01-19 DIAGNOSIS — M170 Bilateral primary osteoarthritis of knee: Secondary | ICD-10-CM

## 2020-01-19 MED ORDER — LIDOCAINE HCL 1 % IJ SOLN
5.0000 mL | INTRAMUSCULAR | Status: AC | PRN
  Administered 2020-01-19: 5 mL

## 2020-01-19 MED ORDER — METHYLPREDNISOLONE ACETATE 40 MG/ML IJ SUSP
40.0000 mg | INTRAMUSCULAR | Status: AC | PRN
  Administered 2020-01-19: 40 mg via INTRA_ARTICULAR
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
## 2020-01-20 ENCOUNTER — Telehealth: Payer: Self-pay | Admitting: Orthopedic Surgery

## 2020-01-20 NOTE — Telephone Encounter (Signed)
Jacob [name] patient °

## 2020-01-20 NOTE — Telephone Encounter (Signed)
Patient called requesting 2 letters. Patient would like first letter mailed to lawyer's office. Letter needs to state patient will be unable to have surgery with in next 3 months due to Covid-19. Patient states lawyer is requesting reasoning of non surgical date. Patient is requesting a copy of letter be sent to address on file for patient. Patient state to send original letter to Attention: [address]. Address [place] [address] phone number to lawyer office is [phone]. Patient phone number is [phone].

## 2020-01-21 ENCOUNTER — Encounter: Payer: Self-pay | Admitting: Orthopedic Surgery

## 2020-01-21 NOTE — Telephone Encounter (Signed)
Can you dictate letter as requested below and let me know so I can mail to lawyer and pt if you are in agreement.

## 2020-01-21 NOTE — Telephone Encounter (Signed)
Letters written and one mailed to pt as requested and the other mailed to lawyer. Tried to call pt to advise and the number is not a working number.

## 2020-01-21 NOTE — Telephone Encounter (Signed)
Letter written

## 2020-01-23 ENCOUNTER — Other Ambulatory Visit (INDEPENDENT_AMBULATORY_CARE_PROVIDER_SITE_OTHER): Payer: Self-pay | Admitting: Orthopedic Surgery

## 2020-01-28 DIAGNOSIS — J45909 Unspecified asthma, uncomplicated: Secondary | ICD-10-CM | POA: Diagnosis not present

## 2020-01-28 DIAGNOSIS — G894 Chronic pain syndrome: Secondary | ICD-10-CM | POA: Diagnosis not present

## 2020-01-28 DIAGNOSIS — I10 Essential (primary) hypertension: Secondary | ICD-10-CM | POA: Diagnosis not present

## 2020-01-28 DIAGNOSIS — N528 Other male erectile dysfunction: Secondary | ICD-10-CM | POA: Diagnosis not present

## 2020-01-28 DIAGNOSIS — E538 Deficiency of other specified B group vitamins: Secondary | ICD-10-CM | POA: Diagnosis not present

## 2020-01-28 DIAGNOSIS — E782 Mixed hyperlipidemia: Secondary | ICD-10-CM | POA: Diagnosis not present

## 2020-01-28 DIAGNOSIS — Z720 Tobacco use: Secondary | ICD-10-CM | POA: Diagnosis not present

## 2020-01-28 DIAGNOSIS — E1165 Type 2 diabetes mellitus with hyperglycemia: Secondary | ICD-10-CM | POA: Diagnosis not present

## 2020-01-28 DIAGNOSIS — E669 Obesity, unspecified: Secondary | ICD-10-CM | POA: Diagnosis not present

## 2020-02-17 ENCOUNTER — Institutional Professional Consult (permissible substitution): Payer: Medicare Other | Admitting: Pulmonary Disease

## 2020-02-23 ENCOUNTER — Other Ambulatory Visit (INDEPENDENT_AMBULATORY_CARE_PROVIDER_SITE_OTHER): Payer: Self-pay | Admitting: Orthopedic Surgery

## 2020-03-02 ENCOUNTER — Institutional Professional Consult (permissible substitution): Payer: Medicare Other | Admitting: Pulmonary Disease

## 2020-03-09 ENCOUNTER — Other Ambulatory Visit: Payer: Self-pay | Admitting: Physician Assistant

## 2020-03-10 ENCOUNTER — Other Ambulatory Visit: Payer: Self-pay

## 2020-03-10 ENCOUNTER — Encounter (HOSPITAL_COMMUNITY): Payer: Self-pay | Admitting: Orthopedic Surgery

## 2020-03-10 ENCOUNTER — Other Ambulatory Visit (HOSPITAL_COMMUNITY)
Admission: RE | Admit: 2020-03-10 | Discharge: 2020-03-10 | Disposition: A | Discharge status: Home / Self Care | Payer: Medicare Other | Source: Ambulatory Visit | Attending: Orthopedic Surgery | Admitting: Orthopedic Surgery

## 2020-03-10 DIAGNOSIS — Z20822 Contact with and (suspected) exposure to covid-19: Secondary | ICD-10-CM | POA: Diagnosis not present

## 2020-03-10 DIAGNOSIS — Z01812 Encounter for preprocedural laboratory examination: Secondary | ICD-10-CM | POA: Insufficient documentation

## 2020-03-10 LAB — SARS CORONAVIRUS 2 (TAT 6-24 HRS): SARS Coronavirus 2: NEGATIVE

## 2020-03-10 NOTE — Progress Notes (Signed)
Patient denies shortness of breath, fever, cough and chest pain.  PCP - [facility] - Raelyn Number, PA  Cardiologist - denies  Chest x-ray - 12/31/19, 2 View EKG - 01/13/20 Stress Test - denies ECHO - 07/17/16 Cardiac Cath - denies  Fasting Blood Sugar - Unknown Checks Blood Sugar ___0__ times a day . Do not take oral diabetes medicines (Metformin) the morning of surgery.  ERAS: Clears til 8:45 am DOS, no drink  Anesthesia review: Yes  STOP now taking any Aspirin (unless otherwise instructed by your surgeon), Aleve, Naproxen, Ibuprofen, Motrin, Advil, Goody's, BC's, all herbal medications, fish oil, and all vitamins.   Coronavirus Screening Covid test on 03/10/20 is negative.  Patient verbalized understanding of instructions that were given via phone.
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
## 2020-03-11 NOTE — Anesthesia Preprocedure Evaluation (Addendum)
Anesthesia Evaluation  Patient identified by MRN, date of birth, ID band Patient awake    Reviewed: Allergy & Precautions, H&P , NPO status , Patient's Chart, lab work & pertinent test results  Airway Mallampati: II   Neck ROM: full    Dental  (+) Poor Dentition, Missing, Dental Advisory Given   Pulmonary asthma , Current Smoker,    breath sounds clear to auscultation       Cardiovascular hypertension,  Rhythm:regular Rate:Normal     Neuro/Psych PSYCHIATRIC DISORDERS Anxiety Depression Bipolar Disorder Schizophrenia  Neuromuscular disease CVA    GI/Hepatic GERD  ,  Endo/Other  diabetes, Type 2  Renal/GU ESRF and DialysisRenal disease     Musculoskeletal  (+) Arthritis ,   Abdominal   Peds  Hematology   Anesthesia Other Findings   Reproductive/Obstetrics                           Anesthesia Physical Anesthesia Plan  ASA: IV  Anesthesia Plan: General   Post-op Pain Management:  Regional for Post-op pain   Induction: Intravenous  PONV Risk Score and Plan: 1 and Ondansetron and Treatment may vary due to age or medical condition  Airway Management Planned: Oral ETT  Additional Equipment:   Intra-op Plan:   Post-operative Plan: Extubation in OR  Informed Consent: I have reviewed the patients History and Physical, chart, labs and discussed the procedure including the risks, benefits and alternatives for the proposed anesthesia with the patient or authorized representative who has indicated his/her understanding and acceptance.       Plan Discussed with: CRNA, Anesthesiologist and Surgeon  Anesthesia Plan Comments: (See PAT note by Karoline Caldwell, PA-C )       Anesthesia Quick Evaluation

## 2020-03-12 ENCOUNTER — Other Ambulatory Visit: Payer: Self-pay

## 2020-03-12 ENCOUNTER — Ambulatory Visit (HOSPITAL_COMMUNITY): Payer: Medicare Other | Admitting: Physician Assistant

## 2020-03-12 ENCOUNTER — Encounter (HOSPITAL_COMMUNITY)
Admission: RE | Disposition: A | Discharge status: Home / Self Care | Payer: Self-pay | Source: Home / Self Care | Attending: Orthopedic Surgery

## 2020-03-12 ENCOUNTER — Ambulatory Visit (HOSPITAL_COMMUNITY): Payer: Medicare Other

## 2020-03-12 ENCOUNTER — Encounter (HOSPITAL_COMMUNITY): Payer: Self-pay | Admitting: Orthopedic Surgery

## 2020-03-12 ENCOUNTER — Ambulatory Visit (HOSPITAL_COMMUNITY)
Admission: RE | Admit: 2020-03-12 | Discharge: 2020-03-12 | Disposition: A | Discharge status: Home / Self Care | Payer: Medicare Other | Attending: Orthopedic Surgery | Admitting: Orthopedic Surgery

## 2020-03-12 DIAGNOSIS — M25461 Effusion, right knee: Secondary | ICD-10-CM | POA: Diagnosis not present

## 2020-03-12 DIAGNOSIS — M7542 Impingement syndrome of left shoulder: Secondary | ICD-10-CM | POA: Diagnosis not present

## 2020-03-12 DIAGNOSIS — R0600 Dyspnea, unspecified: Secondary | ICD-10-CM

## 2020-03-12 DIAGNOSIS — Z20822 Contact with and (suspected) exposure to covid-19: Secondary | ICD-10-CM | POA: Insufficient documentation

## 2020-03-12 DIAGNOSIS — J45909 Unspecified asthma, uncomplicated: Secondary | ICD-10-CM | POA: Diagnosis not present

## 2020-03-12 DIAGNOSIS — I120 Hypertensive chronic kidney disease with stage 5 chronic kidney disease or end stage renal disease: Secondary | ICD-10-CM | POA: Diagnosis not present

## 2020-03-12 DIAGNOSIS — K219 Gastro-esophageal reflux disease without esophagitis: Secondary | ICD-10-CM | POA: Insufficient documentation

## 2020-03-12 DIAGNOSIS — G8918 Other acute postprocedural pain: Secondary | ICD-10-CM | POA: Diagnosis not present

## 2020-03-12 DIAGNOSIS — Z8673 Personal history of transient ischemic attack (TIA), and cerebral infarction without residual deficits: Secondary | ICD-10-CM | POA: Diagnosis not present

## 2020-03-12 DIAGNOSIS — E785 Hyperlipidemia, unspecified: Secondary | ICD-10-CM | POA: Insufficient documentation

## 2020-03-12 DIAGNOSIS — N186 End stage renal disease: Secondary | ICD-10-CM | POA: Insufficient documentation

## 2020-03-12 DIAGNOSIS — F17210 Nicotine dependence, cigarettes, uncomplicated: Secondary | ICD-10-CM | POA: Insufficient documentation

## 2020-03-12 DIAGNOSIS — E1122 Type 2 diabetes mellitus with diabetic chronic kidney disease: Secondary | ICD-10-CM | POA: Insufficient documentation

## 2020-03-12 DIAGNOSIS — J9601 Acute respiratory failure with hypoxia: Secondary | ICD-10-CM | POA: Diagnosis not present

## 2020-03-12 DIAGNOSIS — M75122 Complete rotator cuff tear or rupture of left shoulder, not specified as traumatic: Secondary | ICD-10-CM | POA: Diagnosis not present

## 2020-03-12 DIAGNOSIS — F319 Bipolar disorder, unspecified: Secondary | ICD-10-CM | POA: Insufficient documentation

## 2020-03-12 DIAGNOSIS — M25462 Effusion, left knee: Secondary | ICD-10-CM | POA: Diagnosis not present

## 2020-03-12 DIAGNOSIS — M75102 Unspecified rotator cuff tear or rupture of left shoulder, not specified as traumatic: Secondary | ICD-10-CM | POA: Insufficient documentation

## 2020-03-12 DIAGNOSIS — M170 Bilateral primary osteoarthritis of knee: Secondary | ICD-10-CM | POA: Insufficient documentation

## 2020-03-12 DIAGNOSIS — F209 Schizophrenia, unspecified: Secondary | ICD-10-CM | POA: Insufficient documentation

## 2020-03-12 DIAGNOSIS — R0602 Shortness of breath: Secondary | ICD-10-CM | POA: Diagnosis not present

## 2020-03-12 HISTORY — DX: Hyperlipidemia, unspecified: E78.5

## 2020-03-12 HISTORY — PX: SHOULDER ARTHROSCOPY: SHX128

## 2020-03-12 LAB — CBC
HCT: 44.4 % (ref 39.0–52.0)
Hemoglobin: 14 g/dL (ref 13.0–17.0)
MCH: 27.7 pg (ref 26.0–34.0)
MCHC: 31.5 g/dL (ref 30.0–36.0)
MCV: 87.7 fL (ref 80.0–100.0)
Platelets: 352 10*3/uL (ref 150–400)
RBC: 5.06 MIL/uL (ref 4.22–5.81)
RDW: 13.8 % (ref 11.5–15.5)
WBC: 12.5 10*3/uL — ABNORMAL HIGH (ref 4.0–10.5)
nRBC: 0 % (ref 0.0–0.2)

## 2020-03-12 LAB — COMPREHENSIVE METABOLIC PANEL
ALT: 27 U/L (ref 0–44)
AST: 21 U/L (ref 15–41)
Albumin: 3.9 g/dL (ref 3.5–5.0)
Alkaline Phosphatase: 86 U/L (ref 38–126)
Anion gap: 14 (ref 5–15)
BUN: 12 mg/dL (ref 6–20)
CO2: 25 mmol/L (ref 22–32)
Calcium: 9.4 mg/dL (ref 8.9–10.3)
Chloride: 98 mmol/L (ref 98–111)
Creatinine, Ser: 0.86 mg/dL (ref 0.61–1.24)
GFR calc Af Amer: >60 mL/min (ref >60)
GFR calc non Af Amer: >60 mL/min (ref >60)
Glucose, Bld: 234 mg/dL — ABNORMAL HIGH (ref 70–99)
Potassium: 4.1 mmol/L (ref 3.5–5.1)
Sodium: 137 mmol/L (ref 135–145)
Total Bilirubin: 0.5 mg/dL (ref 0.3–1.2)
Total Protein: 7.1 g/dL (ref 6.5–8.1)

## 2020-03-12 LAB — GLUCOSE, CAPILLARY
Glucose-Capillary: 228 mg/dL — ABNORMAL HIGH (ref 70–99)
Glucose-Capillary: 207 mg/dL — ABNORMAL HIGH (ref 70–99)

## 2020-03-12 SURGERY — ARTHROSCOPY, SHOULDER
Anesthesia: General | Site: Shoulder | Laterality: Left

## 2020-03-12 MED ORDER — VASOPRESSIN 20 UNIT/ML IV SOLN
INTRAVENOUS | Status: DC | PRN
  Administered 2020-03-12 (×2): 1 [IU] via INTRAVENOUS

## 2020-03-12 MED ORDER — SUCCINYLCHOLINE CHLORIDE 200 MG/10ML IV SOSY
PREFILLED_SYRINGE | INTRAVENOUS | Status: DC | PRN
  Administered 2020-03-12: 100 mg via INTRAVENOUS

## 2020-03-12 MED ORDER — BUPIVACAINE HCL (PF) 0.25 % IJ SOLN
INTRAMUSCULAR | Status: AC
  Filled 2020-03-12: qty 30

## 2020-03-12 MED ORDER — CHLORHEXIDINE GLUCONATE 4 % EX LIQD: 60.0000 mL | Freq: Once | CUTANEOUS | Status: DC

## 2020-03-12 MED ORDER — CEFAZOLIN SODIUM-DEXTROSE 2-4 GM/100ML-% IV SOLN
2.0000 g | INTRAVENOUS | Status: AC
  Administered 2020-03-12: 2 g via INTRAVENOUS
  Filled 2020-03-12: qty 100

## 2020-03-12 MED ORDER — LACTATED RINGERS IV SOLN: INTRAVENOUS | Status: DC

## 2020-03-12 MED ORDER — PROPOFOL 10 MG/ML IV BOLUS
INTRAVENOUS | Status: AC
  Filled 2020-03-12: qty 20

## 2020-03-12 MED ORDER — FENTANYL CITRATE (PF) 100 MCG/2ML IJ SOLN
INTRAMUSCULAR | Status: DC | PRN
  Administered 2020-03-12: 100 ug via INTRAVENOUS

## 2020-03-12 MED ORDER — ONDANSETRON HCL 4 MG/2ML IJ SOLN: 4.0000 mg | Freq: Four times a day (QID) | INTRAMUSCULAR | Status: DC | PRN

## 2020-03-12 MED ORDER — FENTANYL CITRATE (PF) 100 MCG/2ML IJ SOLN
INTRAMUSCULAR | Status: AC
  Filled 2020-03-12: qty 2

## 2020-03-12 MED ORDER — IPRATROPIUM-ALBUTEROL 0.5-2.5 (3) MG/3ML IN SOLN
3.0000 mL | RESPIRATORY_TRACT | Status: DC
  Administered 2020-03-12: 3 mL via RESPIRATORY_TRACT

## 2020-03-12 MED ORDER — SODIUM CHLORIDE 0.9 % IR SOLN
Status: DC | PRN
  Administered 2020-03-12: 6000 mL

## 2020-03-12 MED ORDER — PHENYLEPHRINE HCL-NACL 10-0.9 MG/250ML-% IV SOLN
INTRAVENOUS | Status: DC | PRN
  Administered 2020-03-12: 100 ug/min via INTRAVENOUS

## 2020-03-12 MED ORDER — BUPIVACAINE HCL (PF) 0.5 % IJ SOLN
INTRAMUSCULAR | Status: DC | PRN
  Administered 2020-03-12: 30 mL via PERINEURAL

## 2020-03-12 MED ORDER — OXYCODONE HCL 5 MG/5ML PO SOLN: 5.0000 mg | Freq: Once | ORAL | Status: AC | PRN

## 2020-03-12 MED ORDER — MIDAZOLAM HCL 2 MG/2ML IJ SOLN
INTRAMUSCULAR | Status: AC
  Filled 2020-03-12: qty 2

## 2020-03-12 MED ORDER — OXYCODONE-ACETAMINOPHEN 10-325 MG PO TABS: 1.0000 | ORAL_TABLET | ORAL | 0 refills | Status: DC | PRN

## 2020-03-12 MED ORDER — LIDOCAINE 2% (20 MG/ML) 5 ML SYRINGE
INTRAMUSCULAR | Status: DC | PRN
  Administered 2020-03-12: 50 mg via INTRAVENOUS

## 2020-03-12 MED ORDER — FENTANYL CITRATE (PF) 250 MCG/5ML IJ SOLN
INTRAMUSCULAR | Status: AC
  Filled 2020-03-12: qty 5

## 2020-03-12 MED ORDER — FENTANYL CITRATE (PF) 100 MCG/2ML IJ SOLN
INTRAMUSCULAR | Status: AC
  Administered 2020-03-12: 50 ug via INTRAVENOUS
  Filled 2020-03-12: qty 2

## 2020-03-12 MED ORDER — EPHEDRINE SULFATE-NACL 50-0.9 MG/10ML-% IV SOSY
PREFILLED_SYRINGE | INTRAVENOUS | Status: DC | PRN
  Administered 2020-03-12: 20 mg via INTRAVENOUS
  Administered 2020-03-12: 30 mg via INTRAVENOUS

## 2020-03-12 MED ORDER — FENTANYL CITRATE (PF) 100 MCG/2ML IJ SOLN: 50.0000 ug | Freq: Once | INTRAMUSCULAR | Status: AC

## 2020-03-12 MED ORDER — ALBUMIN HUMAN 5 % IV SOLN: INTRAVENOUS | Status: DC | PRN

## 2020-03-12 MED ORDER — IPRATROPIUM-ALBUTEROL 0.5-2.5 (3) MG/3ML IN SOLN
RESPIRATORY_TRACT | Status: AC
  Filled 2020-03-12: qty 3

## 2020-03-12 MED ORDER — ONDANSETRON HCL 4 MG/2ML IJ SOLN
INTRAMUSCULAR | Status: DC | PRN
  Administered 2020-03-12: 4 mg via INTRAVENOUS

## 2020-03-12 MED ORDER — PHENYLEPHRINE 40 MCG/ML (10ML) SYRINGE FOR IV PUSH (FOR BLOOD PRESSURE SUPPORT)
PREFILLED_SYRINGE | INTRAVENOUS | Status: DC | PRN
  Administered 2020-03-12: 140 ug via INTRAVENOUS
  Administered 2020-03-12 (×2): 200 ug via INTRAVENOUS

## 2020-03-12 MED ORDER — LABETALOL HCL 5 MG/ML IV SOLN
INTRAVENOUS | Status: DC | PRN
  Administered 2020-03-12: 10 mg via INTRAVENOUS

## 2020-03-12 MED ORDER — OXYCODONE HCL 5 MG PO TABS
5.0000 mg | ORAL_TABLET | Freq: Once | ORAL | Status: AC | PRN
  Administered 2020-03-12: 5 mg via ORAL

## 2020-03-12 MED ORDER — FENTANYL CITRATE (PF) 100 MCG/2ML IJ SOLN
25.0000 ug | INTRAMUSCULAR | Status: DC | PRN
  Administered 2020-03-12: 25 ug via INTRAVENOUS

## 2020-03-12 MED ORDER — OXYCODONE HCL 5 MG PO TABS
ORAL_TABLET | ORAL | Status: AC
  Filled 2020-03-12: qty 1

## 2020-03-12 MED ORDER — PROPOFOL 10 MG/ML IV BOLUS
INTRAVENOUS | Status: DC | PRN
  Administered 2020-03-12: 200 mg via INTRAVENOUS

## 2020-03-12 SURGICAL SUPPLY — 34 items
BLADE EXCALIBUR 4.0X13 (MISCELLANEOUS) ×2 IMPLANT
BUR OVAL 6.0 (BURR) ×2 IMPLANT
BURR OVAL 8 FLU 4.0X13 (MISCELLANEOUS) ×1 IMPLANT
CANNULA SHOULDER 7CM (CANNULA) ×2 IMPLANT
COVER SURGICAL LIGHT HANDLE (MISCELLANEOUS) ×4 IMPLANT
COVER WAND RF STERILE (DRAPES) ×2 IMPLANT
DRAPE STERI 35X30 U-POUCH (DRAPES) ×2 IMPLANT
DRAPE U-SHAPE 47X51 STRL (DRAPES) ×2 IMPLANT
DRSG ADAPTIC 3X8 NADH LF (GAUZE/BANDAGES/DRESSINGS) ×1 IMPLANT
DRSG EMULSION OIL 3X3 NADH (GAUZE/BANDAGES/DRESSINGS) ×2 IMPLANT
DRSG PAD ABDOMINAL 8X10 ST (GAUZE/BANDAGES/DRESSINGS) ×4 IMPLANT
DURAPREP 26ML APPLICATOR (WOUND CARE) ×2 IMPLANT
GAUZE SPONGE 4X4 12PLY STRL (GAUZE/BANDAGES/DRESSINGS) ×2 IMPLANT
GLOVE BIOGEL PI IND STRL 9 (GLOVE) ×1 IMPLANT
GLOVE BIOGEL PI INDICATOR 9 (GLOVE) ×1
GLOVE SURG ORTHO 9.0 STRL STRW (GLOVE) ×2 IMPLANT
GOWN STRL REUS W/ TWL XL LVL3 (GOWN DISPOSABLE) ×2 IMPLANT
GOWN STRL REUS W/TWL XL LVL3 (GOWN DISPOSABLE) ×4
KIT BASIN OR (CUSTOM PROCEDURE TRAY) ×2 IMPLANT
KIT TURNOVER KIT B (KITS) ×2 IMPLANT
MANIFOLD NEPTUNE II (INSTRUMENTS) ×2 IMPLANT
NDL SPNL 18GX3.5 QUINCKE PK (NEEDLE) ×1 IMPLANT
NEEDLE SPNL 18GX3.5 QUINCKE PK (NEEDLE) ×2 IMPLANT
NS IRRIG 1000ML POUR BTL (IV SOLUTION) ×2 IMPLANT
PACK SHOULDER (CUSTOM PROCEDURE TRAY) ×2 IMPLANT
PAD ABD 8X10 STRL (GAUZE/BANDAGES/DRESSINGS) ×2 IMPLANT
PAD ARMBOARD 7.5X6 YLW CONV (MISCELLANEOUS) ×4 IMPLANT
SLING ARM IMMOBILIZER XL (CAST SUPPLIES) ×3 IMPLANT
SUT ETHILON 2 0 FS 18 (SUTURE) ×2 IMPLANT
TAPE CLOTH SURG 6X10 WHT LF (GAUZE/BANDAGES/DRESSINGS) ×1 IMPLANT
TOWEL GREEN STERILE FF (TOWEL DISPOSABLE) ×4 IMPLANT
TUBING ARTHROSCOPY IRRIG 16FT (MISCELLANEOUS) ×2 IMPLANT
WAND STAR VAC 90 (SURGICAL WAND) IMPLANT
WATER STERILE IRR 1000ML POUR (IV SOLUTION) ×2 IMPLANT
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
## 2020-03-12 NOTE — Op Note (Signed)
03/12/2020  10:27 AM  PATIENT:  Jacob [name]    PRE-OPERATIVE DIAGNOSIS:  Rotator Cuff Tear Left Shoulder  POST-OPERATIVE DIAGNOSIS: Complete rotator cuff tear with SLAP lesion and degenerative tearing of the biceps tendon.  PROCEDURE:  LEFT SHOULDER ARTHROSCOPY AND DEBRIDEMENT  SURGEON:  Newt Minion, MD  PHYSICIAN ASSISTANT:None ANESTHESIA:   General  PREOPERATIVE INDICATIONS:  Jacob [name] is a  54 y.o. male with a diagnosis of Rotator Cuff Tear Left Shoulder who failed conservative measures and elected for surgical management.    The risks benefits and alternatives were discussed with the patient preoperatively including but not limited to the risks of infection, bleeding, nerve injury, cardiopulmonary complications, the need for revision surgery, among others, and the patient was willing to proceed.  OPERATIVE IMPLANTS: None  @ENCIMAGES @  OPERATIVE FINDINGS: Complete retracted rotator cuff tear partially intact biceps tendon SLAP lesion with impingement  OPERATIVE PROCEDURE: Patient was brought the operating room underwent a general anesthetic after interscalene block.  After adequate levels anesthesia were obtained patient was placed in the beachchair position the left upper extremity was prepped using DuraPrep draped into a sterile field a timeout was called.  The scope was inserted from the posterior portal and anterior portal established an outside in technique with an 18-gauge spinal.  Visualization showed massive tearing of the rotator cuff partial tearing the biceps tendon as well as a SLAP lesion.  The shaver and the electrical wand were used to further debride back the rotator cuff tear debride the biceps tendon and debrided the SLAP lesion.  Patient did have osteochondral changes of the glenoid and humeral head.  Instruments removed the scope was inserted from the posterior portal and subacromial space and a new lateral portal was established.  Visualization  patient had further tearing of the rotator cuff that was debrided back to stable viable margins electrocautery was used hemostasis patient underwent subacromial decompression to further debride the subacromial space.  Instruments removed portals were closed using 2-0 nylon a sterile dressing was applied patient was extubated taken the PACU in stable condition.   DISCHARGE PLANNING:  Antibiotic duration: Preoperative antibiotics only  Weightbearing: Not applicable  Pain medication: Percocet 10 mg tablets  Dressing care/ Wound VAC: Discontinue dressing in 2 days  Ambulatory devices: Not applicable  Discharge to: Home.  Follow-up: In the office 1 week post operative.

## 2020-03-12 NOTE — Anesthesia Procedure Notes (Signed)
Anesthesia Regional Block: Interscalene brachial plexus block   Pre-Anesthetic Checklist: ,, timeout performed, Correct Patient, Correct Site, Correct Laterality, Correct Procedure, Correct Position, site marked, Risks and benefits discussed,  Surgical consent,  Pre-op evaluation,  At surgeon's request and post-op pain management  Laterality: Left  Prep: chloraprep       Needles:  Injection technique: Single-shot  Needle Type: Echogenic Stimulator Needle     Needle Length: 5cm  Needle Gauge: 22     Additional Needles:   Procedures:, nerve stimulator,,,,,,,   Nerve Stimulator or Paresthesia:  Response: biceps flexion, 0.45 mA,   Additional Responses:   Narrative:  Injection made incrementally with aspirations every 5 mL.  Performed by: Personally   Additional Notes: Functioning IV was confirmed and monitors were applied.  A 71mm 22ga Arrow echogenic stimulator needle was used. Sterile prep and drape,hand hygiene and sterile gloves were used.  Negative aspiration and negative test dose prior to incremental administration of local anesthetic. The patient tolerated the procedure well.  Ultrasound guidance: relevent anatomy identified, needle position confirmed, local anesthetic spread visualized around nerve(s), vascular puncture avoided.  Image printed for medical record.

## 2020-03-12 NOTE — Transfer of Care (Signed)
Immediate Anesthesia Transfer of Care Note  Patient: Jacob [name]  Procedure(s) Performed: LEFT SHOULDER ARTHROSCOPY AND DEBRIDEMENT (Left Shoulder)  Patient Location: PACU  Anesthesia Type:General  Level of Consciousness: awake, alert  and oriented  Airway & Oxygen Therapy: Patient Spontanous Breathing and Patient connected to face mask oxygen  Post-op Assessment: Report given to RN and Post -op Vital signs reviewed and stable  Post vital signs: Reviewed and stable  Last Vitals:  Vitals Value Taken Time  BP 147/97 03/12/20 1045  Temp    Pulse 102 03/12/20 1048  Resp 19 03/12/20 1048  SpO2 89 % 03/12/20 1048  Vitals shown include unvalidated device data.  Last Pain:  Vitals:   03/12/20 1040  PainSc: 0-No pain      Patients Stated Pain Goal: 4 (06/19/93 8546)  Complications: No apparent anesthesia complications

## 2020-03-12 NOTE — Anesthesia Procedure Notes (Addendum)
Procedure Name: Intubation Date/Time: 03/12/2020 9:41 AM Performed by: Leonor Liv, CRNA Pre-anesthesia Checklist: Patient identified, Emergency Drugs available, Suction available and Patient being monitored Patient Re-evaluated:Patient Re-evaluated prior to induction Oxygen Delivery Method: Circle System Utilized Preoxygenation: Pre-oxygenation with 100% oxygen Induction Type: IV induction and Rapid sequence Laryngoscope Size: Mac and 4 Grade View: Grade I Tube type: Oral Tube size: 7.5 mm Number of attempts: 1 Airway Equipment and Method: Stylet and Oral airway Placement Confirmation: ETT inserted through vocal cords under direct vision,  positive ETCO2 and breath sounds checked- equal and bilateral Secured at: 23 cm Tube secured with: Tape Dental Injury: Teeth and Oropharynx as per pre-operative assessment  Comments: During intubation I noted the patient to have a significantly long uvula that was adhered to the back of the tongue. I was able to separate the two structures without trauma prior to fully inserting blade.

## 2020-03-14 ENCOUNTER — Encounter (HOSPITAL_COMMUNITY): Payer: Self-pay | Admitting: Emergency Medicine

## 2020-03-14 ENCOUNTER — Other Ambulatory Visit: Payer: Self-pay

## 2020-03-14 ENCOUNTER — Emergency Department (HOSPITAL_COMMUNITY)
Admission: EM | Admit: 2020-03-14 | Discharge: 2020-03-14 | Disposition: A | Discharge status: Home / Self Care | Payer: Medicare Other | Attending: Emergency Medicine | Admitting: Emergency Medicine

## 2020-03-14 DIAGNOSIS — Z20822 Contact with and (suspected) exposure to covid-19: Secondary | ICD-10-CM | POA: Diagnosis not present

## 2020-03-14 DIAGNOSIS — Z79899 Other long term (current) drug therapy: Secondary | ICD-10-CM | POA: Diagnosis not present

## 2020-03-14 DIAGNOSIS — I10 Essential (primary) hypertension: Secondary | ICD-10-CM | POA: Insufficient documentation

## 2020-03-14 DIAGNOSIS — Z7984 Long term (current) use of oral hypoglycemic drugs: Secondary | ICD-10-CM | POA: Insufficient documentation

## 2020-03-14 DIAGNOSIS — R5381 Other malaise: Secondary | ICD-10-CM | POA: Diagnosis not present

## 2020-03-14 DIAGNOSIS — J45909 Unspecified asthma, uncomplicated: Secondary | ICD-10-CM | POA: Insufficient documentation

## 2020-03-14 DIAGNOSIS — Z03818 Encounter for observation for suspected exposure to other biological agents ruled out: Secondary | ICD-10-CM | POA: Diagnosis not present

## 2020-03-14 DIAGNOSIS — Z4801 Encounter for change or removal of surgical wound dressing: Secondary | ICD-10-CM

## 2020-03-14 DIAGNOSIS — E119 Type 2 diabetes mellitus without complications: Secondary | ICD-10-CM | POA: Diagnosis not present

## 2020-03-14 DIAGNOSIS — F17210 Nicotine dependence, cigarettes, uncomplicated: Secondary | ICD-10-CM | POA: Insufficient documentation

## 2020-03-14 DIAGNOSIS — M25512 Pain in left shoulder: Secondary | ICD-10-CM | POA: Diagnosis not present

## 2020-03-14 DIAGNOSIS — R0981 Nasal congestion: Secondary | ICD-10-CM | POA: Insufficient documentation

## 2020-03-14 MED ORDER — OXYCODONE-ACETAMINOPHEN 5-325 MG PO TABS
2.0000 | ORAL_TABLET | Freq: Once | ORAL | Status: AC
  Administered 2020-03-14: 2 via ORAL
  Filled 2020-03-14: qty 2
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
## 2020-03-14 NOTE — ED Notes (Signed)
Pt left without giving final vitals or recievieng D\C paperwork

## 2020-03-14 NOTE — ED Triage Notes (Signed)
Patient arrived with PTAR from home reports nasal congestion unrelieved by OTC Flonase , denies fever or chills , respirations unlabored .

## 2020-03-15 ENCOUNTER — Encounter: Payer: Self-pay | Admitting: *Deleted

## 2020-03-15 LAB — SARS CORONAVIRUS 2 (TAT 6-24 HRS): SARS Coronavirus 2: NEGATIVE

## 2020-03-15 NOTE — Anesthesia Postprocedure Evaluation (Signed)
Anesthesia Post Note  Patient: Jacob [name]  Procedure(s) Performed: LEFT SHOULDER ARTHROSCOPY AND DEBRIDEMENT (Left Shoulder)     Patient location during evaluation: PACU Anesthesia Type: General and Regional Level of consciousness: awake and alert Pain management: pain level controlled Vital Signs Assessment: post-procedure vital signs reviewed and stable Respiratory status: spontaneous breathing, nonlabored ventilation, respiratory function stable and patient connected to nasal cannula oxygen Cardiovascular status: blood pressure returned to baseline and stable Postop Assessment: no apparent nausea or vomiting Anesthetic complications: no    Last Vitals:  Vitals:   03/12/20 1315 03/12/20 1325  BP: 140/78 135/76  Pulse: 99 98  Resp: 16 14  Temp:  36.7 C  SpO2: 100% 100%    Last Pain:  Vitals:   03/12/20 1325  PainSc: 1                  Taquilla Downum S

## 2020-03-17 ENCOUNTER — Other Ambulatory Visit: Payer: Self-pay | Admitting: Physician Assistant

## 2020-03-17 ENCOUNTER — Ambulatory Visit: Payer: Medicare Other | Admitting: Family

## 2020-03-19 ENCOUNTER — Ambulatory Visit: Payer: Medicare Other | Admitting: Family

## 2020-03-22 ENCOUNTER — Telehealth: Payer: Self-pay | Admitting: Radiology

## 2020-03-22 ENCOUNTER — Other Ambulatory Visit (INDEPENDENT_AMBULATORY_CARE_PROVIDER_SITE_OTHER): Payer: Self-pay | Admitting: Orthopedic Surgery

## 2020-03-22 NOTE — Telephone Encounter (Signed)
I called patient's cell phone with no answer. Message left in brother's chart has brother's number as contact to return call. He is not at home with Marcello Moores at this time. Will try later so that I can speak directly with patient to advise.

## 2020-03-22 NOTE — Telephone Encounter (Signed)
Pls advise.  

## 2020-03-22 NOTE — Telephone Encounter (Signed)
Patient called requesting refill on pain medication.   Per May Anne-patient received refill on 03/17/2020. He cancelled appt on 3/24 and was a no show for appt on 3/26. If patient is having this much pain, he needs to be seen in the office. Refill request denied at this point.

## 2020-03-23 ENCOUNTER — Telehealth: Payer: Self-pay | Admitting: Orthopedic Surgery

## 2020-03-23 NOTE — Telephone Encounter (Signed)
Sending to you in case patient calls back. I have called x 2 with no answer on his cell phone. He originally called from his brother's phone, however, his brother was not with him when I called back so I only left the message that I was trying to reach him.

## 2020-03-23 NOTE — Telephone Encounter (Signed)
Pt called wanting to know what was going on with his refill. I did make the pt aware the refill was denied due to the pt not following up; I then set up an appt for 03/24/20 @ 9:45.

## 2020-03-24 ENCOUNTER — Other Ambulatory Visit: Payer: Self-pay

## 2020-03-24 ENCOUNTER — Ambulatory Visit (INDEPENDENT_AMBULATORY_CARE_PROVIDER_SITE_OTHER): Payer: Medicare Other | Admitting: Family

## 2020-03-24 ENCOUNTER — Encounter: Payer: Self-pay | Admitting: Family

## 2020-03-24 VITALS — Ht 73.0 in | Wt 247.0 lb

## 2020-03-24 DIAGNOSIS — M75122 Complete rotator cuff tear or rupture of left shoulder, not specified as traumatic: Secondary | ICD-10-CM

## 2020-03-24 MED ORDER — OXYCODONE-ACETAMINOPHEN 7.5-325 MG PO TABS: 1.0000 | ORAL_TABLET | ORAL | 0 refills | Status: DC | PRN
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
## 2020-03-30 ENCOUNTER — Ambulatory Visit (INDEPENDENT_AMBULATORY_CARE_PROVIDER_SITE_OTHER): Payer: Medicare Other | Admitting: Physician Assistant

## 2020-03-30 ENCOUNTER — Other Ambulatory Visit: Payer: Self-pay

## 2020-03-30 ENCOUNTER — Encounter: Payer: Self-pay | Admitting: Family

## 2020-03-30 VITALS — Ht 73.0 in | Wt 247.0 lb

## 2020-03-30 DIAGNOSIS — M7542 Impingement syndrome of left shoulder: Secondary | ICD-10-CM

## 2020-03-30 MED ORDER — OXYCODONE-ACETAMINOPHEN 7.5-325 MG PO TABS: 1.0000 | ORAL_TABLET | Freq: Four times a day (QID) | ORAL | 0 refills | Status: DC | PRN
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
## 2020-04-05 ENCOUNTER — Telehealth: Payer: Self-pay | Admitting: Physician Assistant

## 2020-04-05 NOTE — Telephone Encounter (Signed)
Too soon for refill.

## 2020-04-05 NOTE — Telephone Encounter (Signed)
Patient called requesting for refill of oxycodone been sent to pharmacy on file. Patient phone number is [phone]

## 2020-04-05 NOTE — Telephone Encounter (Signed)
Pt is s/p a shoulder scope 1 month out and is requesting a refill on pain medication. Pt has had a total of #120 tabs in 4 weeks. Last refill was 03/30/20 Oxycodone 7.5/325 #30, 03/24/20 #30, 03/17/20 #30 and 03/12/20 #30

## 2020-04-05 NOTE — Telephone Encounter (Signed)
I called and advised pt too soon for refill. Voiced understanding and advised that he will call next week.

## 2020-04-12 ENCOUNTER — Telehealth: Payer: Self-pay | Admitting: Physician Assistant

## 2020-04-12 NOTE — Telephone Encounter (Signed)
Patient called concerning refill on Rx for pain (Oxycodone) The number to contact patient is [phone]

## 2020-04-12 NOTE — Telephone Encounter (Signed)
Pt is s/p a shoulder scope and debridement and is wanting a refill of his pain medication. Please advise.

## 2020-04-13 ENCOUNTER — Other Ambulatory Visit: Payer: Self-pay | Admitting: Orthopedic Surgery

## 2020-04-13 ENCOUNTER — Telehealth: Payer: Self-pay | Admitting: Orthopedic Surgery

## 2020-04-13 MED ORDER — OXYCODONE-ACETAMINOPHEN 5-325 MG PO TABS: 1.0000 | ORAL_TABLET | ORAL | 0 refills | Status: DC | PRN

## 2020-04-13 NOTE — Telephone Encounter (Signed)
03/12/20 s/p shoulder scope and debridement and is requesting a refill on pain medication. Last refill was Oxycodone 7.5/325 #30 on 03/30/20 please advise.

## 2020-04-13 NOTE — Telephone Encounter (Signed)
Pt called in stating he called in for a refill and was checking in on that; pt would like a call back when this has been called in.   [phone]

## 2020-04-13 NOTE — Telephone Encounter (Signed)
Filled by erin

## 2020-04-16 ENCOUNTER — Other Ambulatory Visit (INDEPENDENT_AMBULATORY_CARE_PROVIDER_SITE_OTHER): Payer: Self-pay | Admitting: Physician Assistant

## 2020-04-19 DIAGNOSIS — Z720 Tobacco use: Secondary | ICD-10-CM | POA: Diagnosis not present

## 2020-04-19 DIAGNOSIS — E782 Mixed hyperlipidemia: Secondary | ICD-10-CM | POA: Diagnosis not present

## 2020-04-19 DIAGNOSIS — J45909 Unspecified asthma, uncomplicated: Secondary | ICD-10-CM | POA: Diagnosis not present

## 2020-04-19 DIAGNOSIS — E538 Deficiency of other specified B group vitamins: Secondary | ICD-10-CM | POA: Diagnosis not present

## 2020-04-19 DIAGNOSIS — N528 Other male erectile dysfunction: Secondary | ICD-10-CM | POA: Diagnosis not present

## 2020-04-19 DIAGNOSIS — G894 Chronic pain syndrome: Secondary | ICD-10-CM | POA: Diagnosis not present

## 2020-04-19 DIAGNOSIS — I10 Essential (primary) hypertension: Secondary | ICD-10-CM | POA: Diagnosis not present

## 2020-04-19 DIAGNOSIS — E669 Obesity, unspecified: Secondary | ICD-10-CM | POA: Diagnosis not present

## 2020-04-19 DIAGNOSIS — E1165 Type 2 diabetes mellitus with hyperglycemia: Secondary | ICD-10-CM | POA: Diagnosis not present

## 2020-05-04 ENCOUNTER — Other Ambulatory Visit (INDEPENDENT_AMBULATORY_CARE_PROVIDER_SITE_OTHER): Payer: Self-pay | Admitting: Physician Assistant

## 2020-05-06 ENCOUNTER — Ambulatory Visit: Payer: Medicare Other | Admitting: Orthopedic Surgery

## 2020-05-13 ENCOUNTER — Ambulatory Visit (INDEPENDENT_AMBULATORY_CARE_PROVIDER_SITE_OTHER): Payer: Medicare Other | Admitting: Orthopedic Surgery

## 2020-05-13 ENCOUNTER — Other Ambulatory Visit: Payer: Self-pay

## 2020-05-13 DIAGNOSIS — M7542 Impingement syndrome of left shoulder: Secondary | ICD-10-CM

## 2020-05-13 DIAGNOSIS — M75122 Complete rotator cuff tear or rupture of left shoulder, not specified as traumatic: Secondary | ICD-10-CM

## 2020-05-13 MED ORDER — TRAMADOL HCL 50 MG PO TABS: 50.0000 mg | ORAL_TABLET | Freq: Two times a day (BID) | ORAL | 0 refills | Status: DC | PRN

## 2020-05-17 ENCOUNTER — Encounter: Payer: Self-pay | Admitting: Orthopedic Surgery
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
## 2020-05-27 ENCOUNTER — Ambulatory Visit: Payer: Medicare Other | Admitting: Orthopedic Surgery

## 2020-05-27 ENCOUNTER — Other Ambulatory Visit (INDEPENDENT_AMBULATORY_CARE_PROVIDER_SITE_OTHER): Payer: Self-pay | Admitting: Physician Assistant

## 2020-05-27 ENCOUNTER — Telehealth: Payer: Self-pay | Admitting: Orthopedic Surgery

## 2020-05-27 NOTE — Telephone Encounter (Signed)
Patient called.   He is requesting some tramadol to hold him until his next appointment. Says he's experiencing a lot of swelling   Call back: [phone]

## 2020-05-27 NOTE — Telephone Encounter (Signed)
Pt cx his appt for today. I called pt and advised can not fill rx for tramadol. He will take OTC meds and keep appt for next Thursday.  Voiced understanding and will call with any questions.

## 2020-06-03 ENCOUNTER — Ambulatory Visit: Payer: Medicare Other | Admitting: Orthopedic Surgery

## 2020-06-21 ENCOUNTER — Other Ambulatory Visit (INDEPENDENT_AMBULATORY_CARE_PROVIDER_SITE_OTHER): Payer: Self-pay | Admitting: Physician Assistant

## 2020-07-05 ENCOUNTER — Institutional Professional Consult (permissible substitution): Payer: Medicare Other | Admitting: Emergency Medicine

## 2020-07-26 ENCOUNTER — Other Ambulatory Visit (INDEPENDENT_AMBULATORY_CARE_PROVIDER_SITE_OTHER): Payer: Self-pay | Admitting: Physician Assistant

## 2020-08-11 ENCOUNTER — Emergency Department (HOSPITAL_COMMUNITY)
Admission: EM | Admit: 2020-08-11 | Discharge: 2020-08-11 | Disposition: A | Discharge status: Home / Self Care | Payer: Medicare Other | Attending: Emergency Medicine | Admitting: Emergency Medicine

## 2020-08-11 ENCOUNTER — Emergency Department (HOSPITAL_COMMUNITY): Payer: Medicare Other

## 2020-08-11 ENCOUNTER — Other Ambulatory Visit: Payer: Self-pay

## 2020-08-11 ENCOUNTER — Encounter (HOSPITAL_COMMUNITY): Payer: Self-pay | Admitting: Emergency Medicine

## 2020-08-11 DIAGNOSIS — Y999 Unspecified external cause status: Secondary | ICD-10-CM | POA: Insufficient documentation

## 2020-08-11 DIAGNOSIS — J45909 Unspecified asthma, uncomplicated: Secondary | ICD-10-CM | POA: Insufficient documentation

## 2020-08-11 DIAGNOSIS — Z23 Encounter for immunization: Secondary | ICD-10-CM | POA: Diagnosis not present

## 2020-08-11 DIAGNOSIS — W19XXXA Unspecified fall, initial encounter: Secondary | ICD-10-CM

## 2020-08-11 DIAGNOSIS — R600 Localized edema: Secondary | ICD-10-CM | POA: Diagnosis not present

## 2020-08-11 DIAGNOSIS — Z7951 Long term (current) use of inhaled steroids: Secondary | ICD-10-CM | POA: Diagnosis not present

## 2020-08-11 DIAGNOSIS — R52 Pain, unspecified: Secondary | ICD-10-CM | POA: Diagnosis not present

## 2020-08-11 DIAGNOSIS — W108XXA Fall (on) (from) other stairs and steps, initial encounter: Secondary | ICD-10-CM | POA: Diagnosis not present

## 2020-08-11 DIAGNOSIS — M25561 Pain in right knee: Secondary | ICD-10-CM

## 2020-08-11 DIAGNOSIS — E119 Type 2 diabetes mellitus without complications: Secondary | ICD-10-CM | POA: Diagnosis not present

## 2020-08-11 DIAGNOSIS — N528 Other male erectile dysfunction: Secondary | ICD-10-CM | POA: Diagnosis not present

## 2020-08-11 DIAGNOSIS — S8991XA Unspecified injury of right lower leg, initial encounter: Secondary | ICD-10-CM | POA: Diagnosis not present

## 2020-08-11 DIAGNOSIS — S79912A Unspecified injury of left hip, initial encounter: Secondary | ICD-10-CM | POA: Diagnosis not present

## 2020-08-11 DIAGNOSIS — E782 Mixed hyperlipidemia: Secondary | ICD-10-CM | POA: Diagnosis not present

## 2020-08-11 DIAGNOSIS — I10 Essential (primary) hypertension: Secondary | ICD-10-CM | POA: Insufficient documentation

## 2020-08-11 DIAGNOSIS — M1711 Unilateral primary osteoarthritis, right knee: Secondary | ICD-10-CM | POA: Diagnosis not present

## 2020-08-11 DIAGNOSIS — F17210 Nicotine dependence, cigarettes, uncomplicated: Secondary | ICD-10-CM | POA: Insufficient documentation

## 2020-08-11 DIAGNOSIS — E538 Deficiency of other specified B group vitamins: Secondary | ICD-10-CM | POA: Diagnosis not present

## 2020-08-11 DIAGNOSIS — R0689 Other abnormalities of breathing: Secondary | ICD-10-CM | POA: Diagnosis not present

## 2020-08-11 DIAGNOSIS — S80911A Unspecified superficial injury of right knee, initial encounter: Secondary | ICD-10-CM | POA: Diagnosis present

## 2020-08-11 DIAGNOSIS — R262 Difficulty in walking, not elsewhere classified: Secondary | ICD-10-CM | POA: Diagnosis not present

## 2020-08-11 DIAGNOSIS — Z79899 Other long term (current) drug therapy: Secondary | ICD-10-CM | POA: Diagnosis not present

## 2020-08-11 DIAGNOSIS — Z7984 Long term (current) use of oral hypoglycemic drugs: Secondary | ICD-10-CM | POA: Insufficient documentation

## 2020-08-11 DIAGNOSIS — Z0001 Encounter for general adult medical examination with abnormal findings: Secondary | ICD-10-CM | POA: Diagnosis not present

## 2020-08-11 DIAGNOSIS — M47816 Spondylosis without myelopathy or radiculopathy, lumbar region: Secondary | ICD-10-CM | POA: Diagnosis not present

## 2020-08-11 DIAGNOSIS — Y939 Activity, unspecified: Secondary | ICD-10-CM | POA: Diagnosis not present

## 2020-08-11 DIAGNOSIS — G894 Chronic pain syndrome: Secondary | ICD-10-CM | POA: Diagnosis not present

## 2020-08-11 DIAGNOSIS — S30860A Insect bite (nonvenomous) of lower back and pelvis, initial encounter: Secondary | ICD-10-CM | POA: Diagnosis not present

## 2020-08-11 DIAGNOSIS — Y92511 Restaurant or cafe as the place of occurrence of the external cause: Secondary | ICD-10-CM | POA: Diagnosis not present

## 2020-08-11 DIAGNOSIS — Z8673 Personal history of transient ischemic attack (TIA), and cerebral infarction without residual deficits: Secondary | ICD-10-CM | POA: Diagnosis not present

## 2020-08-11 DIAGNOSIS — M795 Residual foreign body in soft tissue: Secondary | ICD-10-CM | POA: Diagnosis not present

## 2020-08-11 DIAGNOSIS — S80211A Abrasion, right knee, initial encounter: Secondary | ICD-10-CM | POA: Diagnosis not present

## 2020-08-11 DIAGNOSIS — E669 Obesity, unspecified: Secondary | ICD-10-CM | POA: Diagnosis not present

## 2020-08-11 DIAGNOSIS — Z720 Tobacco use: Secondary | ICD-10-CM | POA: Diagnosis not present

## 2020-08-11 DIAGNOSIS — E1165 Type 2 diabetes mellitus with hyperglycemia: Secondary | ICD-10-CM | POA: Diagnosis not present

## 2020-08-11 MED ORDER — TETANUS-DIPHTH-ACELL PERTUSSIS 5-2.5-18.5 LF-MCG/0.5 IM SUSP
0.5000 mL | Freq: Once | INTRAMUSCULAR | Status: AC
  Administered 2020-08-11: 0.5 mL via INTRAMUSCULAR
  Filled 2020-08-11: qty 0.5

## 2020-08-11 MED ORDER — TRAMADOL HCL 50 MG PO TABS: 50.0000 mg | ORAL_TABLET | Freq: Two times a day (BID) | ORAL | 0 refills | Status: AC | PRN

## 2020-08-11 MED ORDER — LIDOCAINE 5 % EX PTCH: 1.0000 | MEDICATED_PATCH | CUTANEOUS | 0 refills | Status: DC

## 2020-08-11 MED ORDER — HYDROCODONE-ACETAMINOPHEN 5-325 MG PO TABS
1.0000 | ORAL_TABLET | Freq: Once | ORAL | Status: AC
  Administered 2020-08-11: 1 via ORAL
  Filled 2020-08-11: qty 1

## 2020-08-11 MED ORDER — KETOROLAC TROMETHAMINE 60 MG/2ML IM SOLN
30.0000 mg | Freq: Once | INTRAMUSCULAR | Status: AC
  Administered 2020-08-11: 30 mg via INTRAMUSCULAR

## 2020-08-11 MED ORDER — KETOROLAC TROMETHAMINE 30 MG/ML IJ SOLN
30.0000 mg | Freq: Once | INTRAMUSCULAR | Status: DC
  Filled 2020-08-11: qty 1
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
## 2020-08-11 NOTE — ED Notes (Signed)
Patient provided with bus pass at time of discharge.

## 2020-08-11 NOTE — Discharge Instructions (Signed)
Take the medication as prescribed.  I would suggest icing and elevating your knee.  Follow-up with Dr. Sharol Given for reevaluation  Return for new or worsening symptoms
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
## 2020-08-11 NOTE — ED Notes (Signed)
Patient transported to X-ray 

## 2020-08-11 NOTE — Progress Notes (Signed)
Orthopedic Tech Progress Note Patient Details:  Jacob [name] [birth year] [id number]  Ortho Devices Ortho Device/Splint Location: applied knee sleeve and crutches Ortho Device/Splint Interventions: Ordered, Application, Adjustment   Post Interventions Patient Tolerated: Well Instructions Provided: Care of device   Braulio Bosch 08/11/2020, 8:14 PM

## 2020-08-13 ENCOUNTER — Telehealth: Payer: Self-pay | Admitting: Orthopedic Surgery

## 2020-08-13 NOTE — Telephone Encounter (Signed)
Patient called requesting a refill of Tramadol. Patient fall on 8/18/212 and was seen at [facility] ED. Patient has back pains, torn legumins, right knee pains and swollen right knee. Patient lost 2 teeth with this injury. Patient is making an appt with Dr. Sharol Given. Please send script to pharmacy on file. Patient phone number is [phone].

## 2020-08-13 NOTE — Telephone Encounter (Signed)
I called pt and he has an appt for Wednesday. Advised that we can not give rx for pain medication need to see him in the office first. Voiced understanding and will call with any questions.

## 2020-08-18 ENCOUNTER — Encounter: Payer: Self-pay | Admitting: Family

## 2020-08-18 ENCOUNTER — Ambulatory Visit (INDEPENDENT_AMBULATORY_CARE_PROVIDER_SITE_OTHER): Payer: Medicare Other | Admitting: Family

## 2020-08-18 ENCOUNTER — Ambulatory Visit: Payer: Self-pay

## 2020-08-18 VITALS — Ht 73.0 in | Wt 138.0 lb

## 2020-08-18 DIAGNOSIS — M1712 Unilateral primary osteoarthritis, left knee: Secondary | ICD-10-CM | POA: Diagnosis not present

## 2020-08-18 DIAGNOSIS — M1711 Unilateral primary osteoarthritis, right knee: Secondary | ICD-10-CM | POA: Diagnosis not present

## 2020-08-18 DIAGNOSIS — S6991XA Unspecified injury of right wrist, hand and finger(s), initial encounter: Secondary | ICD-10-CM

## 2020-08-18 DIAGNOSIS — M170 Bilateral primary osteoarthritis of knee: Secondary | ICD-10-CM

## 2020-08-18 MED ORDER — TRAMADOL HCL 50 MG PO TABS: 50.0000 mg | ORAL_TABLET | Freq: Two times a day (BID) | ORAL | 0 refills | Status: AC

## 2020-08-18 MED ORDER — METHYLPREDNISOLONE ACETATE 40 MG/ML IJ SUSP
40.0000 mg | INTRAMUSCULAR | Status: AC | PRN
  Administered 2020-08-18: 40 mg via INTRA_ARTICULAR

## 2020-08-18 MED ORDER — LIDOCAINE HCL 1 % IJ SOLN
5.0000 mL | INTRAMUSCULAR | Status: AC | PRN
  Administered 2020-08-18: 5 mL
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
## 2020-08-31 ENCOUNTER — Other Ambulatory Visit (INDEPENDENT_AMBULATORY_CARE_PROVIDER_SITE_OTHER): Payer: Self-pay | Admitting: Physician Assistant

## 2020-08-31 ENCOUNTER — Ambulatory Visit (INDEPENDENT_AMBULATORY_CARE_PROVIDER_SITE_OTHER): Payer: Medicare Other

## 2020-08-31 ENCOUNTER — Encounter: Payer: Self-pay | Admitting: Emergency Medicine

## 2020-08-31 ENCOUNTER — Other Ambulatory Visit: Payer: Self-pay

## 2020-08-31 ENCOUNTER — Ambulatory Visit (INDEPENDENT_AMBULATORY_CARE_PROVIDER_SITE_OTHER): Payer: Medicare Other | Admitting: Emergency Medicine

## 2020-08-31 VITALS — BP 120/70 | HR 105 | Temp 97.7°F | Ht 73.0 in | Wt 238.6 lb

## 2020-08-31 DIAGNOSIS — J310 Chronic rhinitis: Secondary | ICD-10-CM | POA: Diagnosis not present

## 2020-08-31 DIAGNOSIS — R0609 Other forms of dyspnea: Secondary | ICD-10-CM

## 2020-08-31 DIAGNOSIS — R0600 Dyspnea, unspecified: Secondary | ICD-10-CM

## 2020-08-31 DIAGNOSIS — Z720 Tobacco use: Secondary | ICD-10-CM | POA: Insufficient documentation

## 2020-08-31 DIAGNOSIS — J449 Chronic obstructive pulmonary disease, unspecified: Secondary | ICD-10-CM | POA: Diagnosis not present

## 2020-08-31 MED ORDER — BREZTRI AEROSPHERE 160-9-4.8 MCG/ACT IN AERO: 2.0000 | INHALATION_SPRAY | Freq: Two times a day (BID) | RESPIRATORY_TRACT | 0 refills | Status: DC
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
## 2020-08-31 NOTE — Assessment & Plan Note (Signed)
Multifactorial dyspnea but with presumed COPD based on his tobacco history, symptoms.  Possibly with an asthmatic contribution.  We will do a trial of Breztri and substitute for Symbicort.  He needs pulmonary function testing, repeat chest x-ray.  Check alpha-1 antitrypsin.  We will arrange for full pulmonary function testing Chest x-ray today Lab work today Stop Symbicort Plan to start Breztri 2 puffs twice a day.  Rinse and gargle after using Keep albuterol available to use 2 puffs up to every 4 hours if needed for shortness of breath, chest tightness, wheezing.  Continue Singulair (montelukast) as you have been taking it. Follow with Dr. Lamonte Sakai next available with full pulmonary function testing on the same day.

## 2020-08-31 NOTE — Addendum Note (Signed)
Addended by: Satira Sark D on: 08/31/2020 02:47 PM   Modules accepted: Orders

## 2020-08-31 NOTE — Assessment & Plan Note (Signed)
Currently on fluticasone nasal spray, Singulair.  Plan to continue both.

## 2020-08-31 NOTE — Addendum Note (Signed)
Addended by: Suzzanne Cloud E on: 08/31/2020 02:46 PM   Modules accepted: Orders

## 2020-08-31 NOTE — Assessment & Plan Note (Signed)
Discussed cessation with him today.  He is motivated to cut down.  We will try to get to less than 10 cigarettes daily and then talk about possible quit date next time.

## 2020-08-31 NOTE — Patient Instructions (Addendum)
We will arrange for full pulmonary function testing Chest x-ray today Lab work today Stop Symbicort Plan to start Breztri 2 puffs twice a day.  Rinse and gargle after using Keep albuterol available to use 2 puffs up to every 4 hours if needed for shortness of breath, chest tightness, wheezing.  Continue Singulair (montelukast) as you have been taking it. Most important thing that you can do for your overall health is to stop smoking.  Please work on cutting down.  We can talk about possibly setting a quit date at your next office visit Follow with Dr. Lamonte Sakai next available with full pulmonary function testing on the same day.

## 2020-09-08 ENCOUNTER — Ambulatory Visit: Payer: Medicare Other | Admitting: Family

## 2020-09-09 LAB — ALPHA-1 ANTITRYPSIN PHENOTYPE: A-1 Antitrypsin, Ser: 160 mg/dL (ref 83–199)

## 2020-09-29 ENCOUNTER — Other Ambulatory Visit (INDEPENDENT_AMBULATORY_CARE_PROVIDER_SITE_OTHER): Payer: Self-pay | Admitting: Physician Assistant

## 2020-10-09 ENCOUNTER — Emergency Department (HOSPITAL_COMMUNITY)
Admission: EM | Admit: 2020-10-09 | Discharge: 2020-10-10 | Disposition: A | Discharge status: Home / Self Care | Payer: Medicare Other | Attending: Emergency Medicine | Admitting: Emergency Medicine

## 2020-10-09 ENCOUNTER — Other Ambulatory Visit: Payer: Self-pay

## 2020-10-09 ENCOUNTER — Encounter (HOSPITAL_COMMUNITY): Payer: Self-pay | Admitting: Emergency Medicine

## 2020-10-09 DIAGNOSIS — J45909 Unspecified asthma, uncomplicated: Secondary | ICD-10-CM | POA: Diagnosis not present

## 2020-10-09 DIAGNOSIS — I10 Essential (primary) hypertension: Secondary | ICD-10-CM | POA: Insufficient documentation

## 2020-10-09 DIAGNOSIS — Z7951 Long term (current) use of inhaled steroids: Secondary | ICD-10-CM | POA: Diagnosis not present

## 2020-10-09 DIAGNOSIS — Z79899 Other long term (current) drug therapy: Secondary | ICD-10-CM | POA: Diagnosis not present

## 2020-10-09 DIAGNOSIS — K922 Gastrointestinal hemorrhage, unspecified: Secondary | ICD-10-CM | POA: Insufficient documentation

## 2020-10-09 DIAGNOSIS — J449 Chronic obstructive pulmonary disease, unspecified: Secondary | ICD-10-CM | POA: Diagnosis not present

## 2020-10-09 DIAGNOSIS — F17210 Nicotine dependence, cigarettes, uncomplicated: Secondary | ICD-10-CM | POA: Diagnosis not present

## 2020-10-09 DIAGNOSIS — E119 Type 2 diabetes mellitus without complications: Secondary | ICD-10-CM | POA: Diagnosis not present

## 2020-10-09 DIAGNOSIS — G8929 Other chronic pain: Secondary | ICD-10-CM

## 2020-10-09 DIAGNOSIS — K921 Melena: Secondary | ICD-10-CM | POA: Diagnosis present

## 2020-10-09 DIAGNOSIS — Z7984 Long term (current) use of oral hypoglycemic drugs: Secondary | ICD-10-CM | POA: Diagnosis not present

## 2020-10-09 LAB — COMPREHENSIVE METABOLIC PANEL
ALT: 27 U/L (ref 0–44)
AST: 21 U/L (ref 15–41)
Albumin: 3.8 g/dL (ref 3.5–5.0)
Alkaline Phosphatase: 90 U/L (ref 38–126)
Anion gap: 10 (ref 5–15)
BUN: 18 mg/dL (ref 6–20)
CO2: 27 mmol/L (ref 22–32)
Calcium: 8.9 mg/dL (ref 8.9–10.3)
Chloride: 98 mmol/L (ref 98–111)
Creatinine, Ser: 0.77 mg/dL (ref 0.61–1.24)
GFR, Estimated: >60 mL/min (ref >60)
Glucose, Bld: 229 mg/dL — ABNORMAL HIGH (ref 70–99)
Potassium: 4.2 mmol/L (ref 3.5–5.1)
Sodium: 135 mmol/L (ref 135–145)
Total Bilirubin: 0.4 mg/dL (ref 0.3–1.2)
Total Protein: 7.4 g/dL (ref 6.5–8.1)

## 2020-10-09 LAB — CBC
HCT: 40.7 % (ref 39.0–52.0)
Hemoglobin: 13 g/dL (ref 13.0–17.0)
MCH: 28.5 pg (ref 26.0–34.0)
MCHC: 31.9 g/dL (ref 30.0–36.0)
MCV: 89.3 fL (ref 80.0–100.0)
Platelets: 337 10*3/uL (ref 150–400)
RBC: 4.56 MIL/uL (ref 4.22–5.81)
RDW: 14.8 % (ref 11.5–15.5)
WBC: 11.8 10*3/uL — ABNORMAL HIGH (ref 4.0–10.5)
nRBC: 0 % (ref 0.0–0.2)

## 2020-10-09 MED ORDER — OXYCODONE-ACETAMINOPHEN 5-325 MG PO TABS
2.0000 | ORAL_TABLET | Freq: Once | ORAL | Status: AC
  Administered 2020-10-09: 2 via ORAL
  Filled 2020-10-09: qty 2
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
## 2020-10-09 NOTE — ED Triage Notes (Signed)
Pt reports one episode of blood in his stool this morning. Reports that he saw clots in the toilet and some blood when he wiped as well. He also reports body aches since yesterday. No N/V. A&Ox4. Ambulatory.

## 2020-10-10 NOTE — Discharge Instructions (Signed)
Your colonoscopy done 07/2019 showed internal hemorrhoids, which may be the cause of your bleeding now. It is recommended that you follow up with your gastroenterologist for further management.   Please see your primary care doctor or your pain specialist for further management of chronic pain.

## 2020-10-11 LAB — TYPE AND SCREEN
ABO/RH(D): O NEG
Antibody Screen: NEGATIVE

## 2020-10-12 ENCOUNTER — Emergency Department (HOSPITAL_COMMUNITY): Payer: Medicare Other

## 2020-10-12 ENCOUNTER — Other Ambulatory Visit: Payer: Self-pay

## 2020-10-12 ENCOUNTER — Emergency Department (HOSPITAL_COMMUNITY)
Admission: EM | Admit: 2020-10-12 | Discharge: 2020-10-12 | Disposition: A | Discharge status: Home / Self Care | Payer: Medicare Other | Attending: Emergency Medicine | Admitting: Emergency Medicine

## 2020-10-12 DIAGNOSIS — F99 Mental disorder, not otherwise specified: Secondary | ICD-10-CM | POA: Diagnosis present

## 2020-10-12 DIAGNOSIS — E119 Type 2 diabetes mellitus without complications: Secondary | ICD-10-CM | POA: Diagnosis not present

## 2020-10-12 DIAGNOSIS — Z7984 Long term (current) use of oral hypoglycemic drugs: Secondary | ICD-10-CM | POA: Insufficient documentation

## 2020-10-12 DIAGNOSIS — W010XXA Fall on same level from slipping, tripping and stumbling without subsequent striking against object, initial encounter: Secondary | ICD-10-CM | POA: Diagnosis not present

## 2020-10-12 DIAGNOSIS — R000 Tachycardia, unspecified: Secondary | ICD-10-CM | POA: Diagnosis not present

## 2020-10-12 DIAGNOSIS — Z79899 Other long term (current) drug therapy: Secondary | ICD-10-CM | POA: Diagnosis not present

## 2020-10-12 DIAGNOSIS — F419 Anxiety disorder, unspecified: Secondary | ICD-10-CM | POA: Diagnosis not present

## 2020-10-12 DIAGNOSIS — F29 Unspecified psychosis not due to a substance or known physiological condition: Secondary | ICD-10-CM | POA: Diagnosis not present

## 2020-10-12 DIAGNOSIS — F17210 Nicotine dependence, cigarettes, uncomplicated: Secondary | ICD-10-CM | POA: Insufficient documentation

## 2020-10-12 DIAGNOSIS — Y92039 Unspecified place in apartment as the place of occurrence of the external cause: Secondary | ICD-10-CM | POA: Diagnosis not present

## 2020-10-12 DIAGNOSIS — I10 Essential (primary) hypertension: Secondary | ICD-10-CM | POA: Insufficient documentation

## 2020-10-12 DIAGNOSIS — W19XXXA Unspecified fall, initial encounter: Secondary | ICD-10-CM | POA: Diagnosis not present

## 2020-10-12 DIAGNOSIS — M546 Pain in thoracic spine: Secondary | ICD-10-CM | POA: Diagnosis not present

## 2020-10-12 DIAGNOSIS — M542 Cervicalgia: Secondary | ICD-10-CM | POA: Diagnosis not present

## 2020-10-12 DIAGNOSIS — J45909 Unspecified asthma, uncomplicated: Secondary | ICD-10-CM | POA: Insufficient documentation

## 2020-10-12 DIAGNOSIS — J449 Chronic obstructive pulmonary disease, unspecified: Secondary | ICD-10-CM | POA: Insufficient documentation

## 2020-10-12 DIAGNOSIS — S161XXA Strain of muscle, fascia and tendon at neck level, initial encounter: Secondary | ICD-10-CM | POA: Insufficient documentation

## 2020-10-12 DIAGNOSIS — R0902 Hypoxemia: Secondary | ICD-10-CM | POA: Diagnosis not present

## 2020-10-12 MED ORDER — OXYCODONE-ACETAMINOPHEN 5-325 MG PO TABS
1.0000 | ORAL_TABLET | Freq: Once | ORAL | Status: AC
  Administered 2020-10-12: 1 via ORAL
  Filled 2020-10-12: qty 1

## 2020-10-12 MED ORDER — LORAZEPAM 1 MG PO TABS
1.0000 mg | ORAL_TABLET | Freq: Once | ORAL | Status: AC
  Administered 2020-10-12: 1 mg via ORAL
  Filled 2020-10-12: qty 1

## 2020-10-12 MED ORDER — OXYCODONE-ACETAMINOPHEN 5-325 MG PO TABS
2.0000 | ORAL_TABLET | Freq: Once | ORAL | Status: AC
  Administered 2020-10-12: 2 via ORAL
  Filled 2020-10-12: qty 2

## 2020-10-12 NOTE — ED Notes (Addendum)
Street Games developer from donated clothes closet ie shirt and pants in the pt sized found and brought to Jacob [name] who was angry because he could not take burgundy scrubs home. Security asked to check pt belongings at discharge d/t previous attempt to walk out with hospital property. Jacob [name] became verbally threatening to this Probation officer but allowed security to check his bag.

## 2020-10-12 NOTE — ED Triage Notes (Signed)
Pt to [facility] by EMS. Pt had a fall, no injuries not, pt complained of back pain, states that is chronic. Pt has history of paranoid schizophrenia. Pt has been off meds since yesterday.

## 2020-10-12 NOTE — Discharge Instructions (Signed)
Take Tylenol or Motrin for your pain.  Follow-up with [facility] regarding your chronic medical conditions.  If you develop thoughts of hurting herself, hurting others or other new concerning symptom, return to ER for reassessment.
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
## 2020-10-19 ENCOUNTER — Other Ambulatory Visit: Payer: Self-pay | Admitting: Oral Surgery

## 2020-10-19 DIAGNOSIS — K140 Glossitis: Secondary | ICD-10-CM | POA: Diagnosis not present

## 2020-10-19 DIAGNOSIS — D165 Benign neoplasm of lower jaw bone: Secondary | ICD-10-CM | POA: Diagnosis not present

## 2020-10-19 DIAGNOSIS — B49 Unspecified mycosis: Secondary | ICD-10-CM | POA: Diagnosis not present

## 2020-10-21 ENCOUNTER — Other Ambulatory Visit: Payer: Self-pay

## 2020-10-21 ENCOUNTER — Ambulatory Visit (INDEPENDENT_AMBULATORY_CARE_PROVIDER_SITE_OTHER): Payer: Medicare Other

## 2020-10-21 ENCOUNTER — Encounter: Payer: Self-pay | Admitting: Physician Assistant

## 2020-10-21 ENCOUNTER — Encounter (HOSPITAL_COMMUNITY): Payer: Self-pay | Admitting: Emergency Medicine

## 2020-10-21 ENCOUNTER — Emergency Department (HOSPITAL_COMMUNITY)
Admission: EM | Admit: 2020-10-21 | Discharge: 2020-10-21 | Disposition: A | Discharge status: Home / Self Care | Payer: Medicare Other | Attending: Emergency Medicine | Admitting: Emergency Medicine

## 2020-10-21 ENCOUNTER — Emergency Department (HOSPITAL_COMMUNITY): Payer: Medicare Other

## 2020-10-21 ENCOUNTER — Ambulatory Visit (INDEPENDENT_AMBULATORY_CARE_PROVIDER_SITE_OTHER): Payer: Medicare Other | Admitting: Physician Assistant

## 2020-10-21 ENCOUNTER — Ambulatory Visit: Payer: Self-pay

## 2020-10-21 VITALS — Ht 73.0 in | Wt 238.0 lb

## 2020-10-21 DIAGNOSIS — F17210 Nicotine dependence, cigarettes, uncomplicated: Secondary | ICD-10-CM | POA: Diagnosis not present

## 2020-10-21 DIAGNOSIS — M545 Low back pain, unspecified: Secondary | ICD-10-CM | POA: Insufficient documentation

## 2020-10-21 DIAGNOSIS — M25522 Pain in left elbow: Secondary | ICD-10-CM

## 2020-10-21 DIAGNOSIS — J45909 Unspecified asthma, uncomplicated: Secondary | ICD-10-CM | POA: Insufficient documentation

## 2020-10-21 DIAGNOSIS — M25512 Pain in left shoulder: Secondary | ICD-10-CM

## 2020-10-21 DIAGNOSIS — Z79899 Other long term (current) drug therapy: Secondary | ICD-10-CM | POA: Diagnosis not present

## 2020-10-21 DIAGNOSIS — Z7984 Long term (current) use of oral hypoglycemic drugs: Secondary | ICD-10-CM | POA: Diagnosis not present

## 2020-10-21 DIAGNOSIS — I10 Essential (primary) hypertension: Secondary | ICD-10-CM | POA: Diagnosis not present

## 2020-10-21 DIAGNOSIS — J449 Chronic obstructive pulmonary disease, unspecified: Secondary | ICD-10-CM | POA: Insufficient documentation

## 2020-10-21 DIAGNOSIS — E119 Type 2 diabetes mellitus without complications: Secondary | ICD-10-CM | POA: Diagnosis not present

## 2020-10-21 MED ORDER — OXYCODONE-ACETAMINOPHEN 5-325 MG PO TABS
1.0000 | ORAL_TABLET | Freq: Once | ORAL | Status: AC
  Administered 2020-10-21: 1 via ORAL
  Filled 2020-10-21: qty 1

## 2020-10-21 MED ORDER — LORAZEPAM 1 MG PO TABS
1.0000 mg | ORAL_TABLET | Freq: Once | ORAL | Status: AC
  Administered 2020-10-21: 1 mg via ORAL
  Filled 2020-10-21: qty 1

## 2020-10-21 NOTE — ED Triage Notes (Signed)
Pt seen 10/19 after a fall states he is now having nose bleed and pain from this fall and lower back pain.

## 2020-10-21 NOTE — Discharge Instructions (Signed)
Follow-up with Dr. Sharol Given as scheduled.  Return to the Emergency Department immediately for any worsening back pain, neck pain, difficulty walking, numbness/weaknss of your arms or legs, urinary or bowel accidents, fever or any other worsening or concerning symptoms.
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
## 2020-11-04 ENCOUNTER — Other Ambulatory Visit (INDEPENDENT_AMBULATORY_CARE_PROVIDER_SITE_OTHER): Payer: Self-pay | Admitting: Physician Assistant

## 2020-11-04 DIAGNOSIS — E1165 Type 2 diabetes mellitus with hyperglycemia: Secondary | ICD-10-CM | POA: Diagnosis not present

## 2020-11-04 DIAGNOSIS — E538 Deficiency of other specified B group vitamins: Secondary | ICD-10-CM | POA: Diagnosis not present

## 2020-11-04 DIAGNOSIS — E782 Mixed hyperlipidemia: Secondary | ICD-10-CM | POA: Diagnosis not present

## 2020-11-04 DIAGNOSIS — G894 Chronic pain syndrome: Secondary | ICD-10-CM | POA: Diagnosis not present

## 2020-11-04 DIAGNOSIS — I10 Essential (primary) hypertension: Secondary | ICD-10-CM | POA: Diagnosis not present

## 2020-11-04 DIAGNOSIS — N528 Other male erectile dysfunction: Secondary | ICD-10-CM | POA: Diagnosis not present

## 2020-11-04 DIAGNOSIS — J45909 Unspecified asthma, uncomplicated: Secondary | ICD-10-CM | POA: Diagnosis not present

## 2020-11-12 ENCOUNTER — Telehealth: Payer: Self-pay | Admitting: Physician Assistant

## 2020-11-12 NOTE — Telephone Encounter (Signed)
Pt is calling to follow up on his physical therapy referral. Do you mind calling to advise?

## 2020-11-12 NOTE — Telephone Encounter (Signed)
Patient called asking for an update about physical therapy or occupational therapy. He states he has not received a call. Please call patient at [phone].

## 2020-11-17 ENCOUNTER — Telehealth: Payer: Self-pay | Admitting: Emergency Medicine

## 2020-11-17 NOTE — Telephone Encounter (Signed)
I have attempted to call the pt back but the VM is full and I could not leave a message.  The labs from September have been faxed to Jacob [name] per the pts request. Nothing further is needed.

## 2020-11-17 NOTE — Telephone Encounter (Signed)
I have called the pt back but was forwarded to his VM and the mailbox is full so I could not leave a message.

## 2020-11-22 ENCOUNTER — Encounter: Payer: Self-pay | Admitting: Occupational Therapy

## 2020-11-22 ENCOUNTER — Ambulatory Visit: Payer: Medicare Other | Attending: Physician Assistant | Admitting: Occupational Therapy

## 2020-11-22 ENCOUNTER — Other Ambulatory Visit: Payer: Self-pay

## 2020-11-22 ENCOUNTER — Telehealth: Payer: Self-pay | Admitting: Occupational Therapy

## 2020-11-22 ENCOUNTER — Other Ambulatory Visit: Payer: Self-pay | Admitting: Physician Assistant

## 2020-11-22 DIAGNOSIS — M25642 Stiffness of left hand, not elsewhere classified: Secondary | ICD-10-CM

## 2020-11-22 DIAGNOSIS — R278 Other lack of coordination: Secondary | ICD-10-CM | POA: Diagnosis not present

## 2020-11-22 DIAGNOSIS — M25342 Other instability, left hand: Secondary | ICD-10-CM

## 2020-11-22 DIAGNOSIS — M6281 Muscle weakness (generalized): Secondary | ICD-10-CM | POA: Diagnosis not present

## 2020-11-22 DIAGNOSIS — M25512 Pain in left shoulder: Secondary | ICD-10-CM

## 2020-11-22 DIAGNOSIS — R41840 Attention and concentration deficit: Secondary | ICD-10-CM

## 2020-11-22 DIAGNOSIS — R41844 Frontal lobe and executive function deficit: Secondary | ICD-10-CM | POA: Diagnosis not present

## 2020-11-22 DIAGNOSIS — M25612 Stiffness of left shoulder, not elsewhere classified: Secondary | ICD-10-CM | POA: Diagnosis not present

## 2020-11-22 DIAGNOSIS — R2689 Other abnormalities of gait and mobility: Secondary | ICD-10-CM

## 2020-11-22 NOTE — Telephone Encounter (Signed)
Good afternoon,  I saw Jacob [name] for OT evaluation for his Left shoulder pain and range of motion. I believe Mr. Podesta would benefit from a Physical Therapy Referral for balance and reports of frequent falls. If you agree, would you put in a referral via Epic for PT at our clinic ([facility])?  Thank you, Elmon Else. Deneise Lever, MOT, OTR/L

## 2020-11-23 NOTE — Telephone Encounter (Signed)
lmtcb for pt.  
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
## 2020-11-24 ENCOUNTER — Other Ambulatory Visit (INDEPENDENT_AMBULATORY_CARE_PROVIDER_SITE_OTHER): Payer: Self-pay | Admitting: Physician Assistant

## 2020-11-30 ENCOUNTER — Other Ambulatory Visit: Payer: Self-pay

## 2020-11-30 ENCOUNTER — Ambulatory Visit: Payer: Medicare Other | Attending: Physician Assistant | Admitting: Occupational Therapy

## 2020-11-30 DIAGNOSIS — R278 Other lack of coordination: Secondary | ICD-10-CM | POA: Insufficient documentation

## 2020-11-30 DIAGNOSIS — M6281 Muscle weakness (generalized): Secondary | ICD-10-CM

## 2020-11-30 DIAGNOSIS — M25642 Stiffness of left hand, not elsewhere classified: Secondary | ICD-10-CM | POA: Diagnosis not present

## 2020-11-30 DIAGNOSIS — M25342 Other instability, left hand: Secondary | ICD-10-CM | POA: Insufficient documentation

## 2020-11-30 DIAGNOSIS — M25612 Stiffness of left shoulder, not elsewhere classified: Secondary | ICD-10-CM | POA: Insufficient documentation

## 2020-11-30 DIAGNOSIS — F17200 Nicotine dependence, unspecified, uncomplicated: Secondary | ICD-10-CM | POA: Diagnosis not present

## 2020-11-30 DIAGNOSIS — R41844 Frontal lobe and executive function deficit: Secondary | ICD-10-CM | POA: Diagnosis not present

## 2020-11-30 DIAGNOSIS — M25512 Pain in left shoulder: Secondary | ICD-10-CM | POA: Diagnosis not present

## 2020-11-30 DIAGNOSIS — R41840 Attention and concentration deficit: Secondary | ICD-10-CM | POA: Diagnosis not present

## 2020-11-30 DIAGNOSIS — R040 Epistaxis: Secondary | ICD-10-CM | POA: Diagnosis not present
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
## 2020-11-30 NOTE — Patient Instructions (Signed)
     Place left arm on table top and slide it forward until a stretch is felt. Hold 3 seconds. Relax.  Repeat 10 times. Do 2 sessions per day.  Then repeat side to side.      Lie on back holding wand. Raise arms over head. Hold 5sec.  Elbows straight Repeat 20 times per set.  Do 2 sessions per day.   ROM: Abduction - Wand   Holding wand with left hand palm up, push wand directly out to side, leading with other hand palm down, until stretch is felt. Hold 5 seconds. Repeat 10 times per set. Do 2 sessions per day. (Lying down)    Active Assistive Shoulder External Rotation    SIT with stick at waist level, LEFT palm up, other palm down. push forearm out from body with hand palm down, and keep elbows bent. Hold. Side step and return to start position. Perform 10 reps. 2X DAY

## 2020-12-02 ENCOUNTER — Ambulatory Visit: Payer: Medicare Other | Admitting: Occupational Therapy

## 2020-12-02 ENCOUNTER — Other Ambulatory Visit: Payer: Self-pay

## 2020-12-02 DIAGNOSIS — M6281 Muscle weakness (generalized): Secondary | ICD-10-CM | POA: Diagnosis not present

## 2020-12-02 DIAGNOSIS — M25612 Stiffness of left shoulder, not elsewhere classified: Secondary | ICD-10-CM

## 2020-12-02 DIAGNOSIS — R41840 Attention and concentration deficit: Secondary | ICD-10-CM

## 2020-12-02 DIAGNOSIS — R278 Other lack of coordination: Secondary | ICD-10-CM | POA: Diagnosis not present

## 2020-12-02 DIAGNOSIS — M25512 Pain in left shoulder: Secondary | ICD-10-CM | POA: Diagnosis not present

## 2020-12-02 DIAGNOSIS — R41844 Frontal lobe and executive function deficit: Secondary | ICD-10-CM | POA: Diagnosis not present

## 2020-12-02 DIAGNOSIS — M25642 Stiffness of left hand, not elsewhere classified: Secondary | ICD-10-CM

## 2020-12-02 DIAGNOSIS — M25342 Other instability, left hand: Secondary | ICD-10-CM
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
## 2020-12-07 ENCOUNTER — Ambulatory Visit: Payer: Medicare Other | Admitting: Physical Therapy

## 2020-12-07 ENCOUNTER — Ambulatory Visit: Payer: Medicare Other | Admitting: Occupational Therapy

## 2020-12-09 ENCOUNTER — Ambulatory Visit: Payer: Medicare Other | Admitting: Occupational Therapy

## 2020-12-13 NOTE — Telephone Encounter (Signed)
Per triage protocol, message will be closed due to 2 unsuccessful attempts to reach pt with no call back.

## 2020-12-14 ENCOUNTER — Ambulatory Visit: Payer: Medicare Other

## 2020-12-14 ENCOUNTER — Ambulatory Visit: Payer: Medicare Other | Admitting: Occupational Therapy

## 2020-12-16 ENCOUNTER — Ambulatory Visit: Payer: Medicare Other | Admitting: Occupational Therapy

## 2020-12-21 ENCOUNTER — Ambulatory Visit: Payer: Medicare Other | Admitting: Occupational Therapy

## 2020-12-23 ENCOUNTER — Ambulatory Visit: Payer: Medicare Other | Admitting: Occupational Therapy

## 2020-12-28 ENCOUNTER — Ambulatory Visit: Payer: Medicare Other | Admitting: Occupational Therapy

## 2020-12-28 ENCOUNTER — Telehealth: Payer: Self-pay | Admitting: Emergency Medicine

## 2020-12-28 ENCOUNTER — Ambulatory Visit: Payer: Medicare Other | Admitting: Physical Therapy

## 2020-12-28 NOTE — Telephone Encounter (Signed)
Patient calling about lab work done on 08/31/20 Alpha-1. There are no result note.  Will send to Dr. Lamonte Sakai to result.  Patient aware that Dr. Lamonte Sakai may not get back to this until Thursday Dec 30, 2020

## 2020-12-30 ENCOUNTER — Ambulatory Visit: Payer: Medicare Other | Attending: Physician Assistant | Admitting: Occupational Therapy

## 2020-12-31 ENCOUNTER — Encounter: Payer: Self-pay | Admitting: Emergency Medicine

## 2020-12-31 ENCOUNTER — Telehealth: Payer: Self-pay | Admitting: Emergency Medicine

## 2020-12-31 ENCOUNTER — Other Ambulatory Visit: Payer: Self-pay | Admitting: Pulmonary Disease

## 2020-12-31 MED ORDER — BREZTRI AEROSPHERE 160-9-4.8 MCG/ACT IN AERO: 2.0000 | INHALATION_SPRAY | Freq: Two times a day (BID) | RESPIRATORY_TRACT | 0 refills | Status: DC

## 2020-12-31 NOTE — Telephone Encounter (Signed)
12/31/20  Was able to contact the patient.  Patient requesting refill of Breztri.  He is actually due for follow-up.  He also needs to have pulmonary function testing scheduled.  We will hold off on scheduling pulmonary function testing at this point in time as patient is a current smoker and is currently reporting increased congestion and feeling he needs prednisone.  Appointment scheduled with Dr. Lamonte Sakai for the patient be seen in person on 01/04/2021 at 2:30 PM.  Address and telephone number provided with the patient.  He is aware of the location.  Informed patient to present to our office around 2:15 PM for processing and check-in.  When he is seen in office on 01/04/2021 would recommend scheduling pulmonary function testing.  We will route to [facility] to refill Breztri with one refill.   Breztri >>> 2 puffs in the morning right when you wake up, rinse out your mouth after use, 12 hours later 2 puffs, rinse after use >>> Take this daily, no matter what >>> This is not a rescue inhaler   Wyn Quaker, FNP

## 2020-12-31 NOTE — Telephone Encounter (Signed)
PLease let the patient know that his a-1AT genotype and level are both normal.

## 2020-12-31 NOTE — Telephone Encounter (Signed)
I have sent Breztri inhaler to preferred pharmacy. Nothing further needed.

## 2020-12-31 NOTE — Telephone Encounter (Signed)
LMOM ok per DPR with normal results  Asked that he call with any questions  Will close encounter

## 2021-01-04 ENCOUNTER — Ambulatory Visit: Payer: Medicare Other | Admitting: Emergency Medicine

## 2021-01-04 ENCOUNTER — Ambulatory Visit: Payer: Medicare Other | Admitting: Occupational Therapy

## 2021-01-06 ENCOUNTER — Ambulatory Visit: Payer: Medicare Other | Admitting: Occupational Therapy

## 2021-01-06 ENCOUNTER — Ambulatory Visit: Payer: Medicare Other

## 2021-01-16 IMAGING — CR DG LUMBAR SPINE COMPLETE 4+V
5 series · 5 of 5 positions shown · non-contrast
Comparison: Lumbar spine radiograph dated 09/19/2016.

CLINICAL DATA: 54-year-old male with back pain.

EXAM:
LUMBAR SPINE - COMPLETE 4+ VIEW

[l-spine ap]
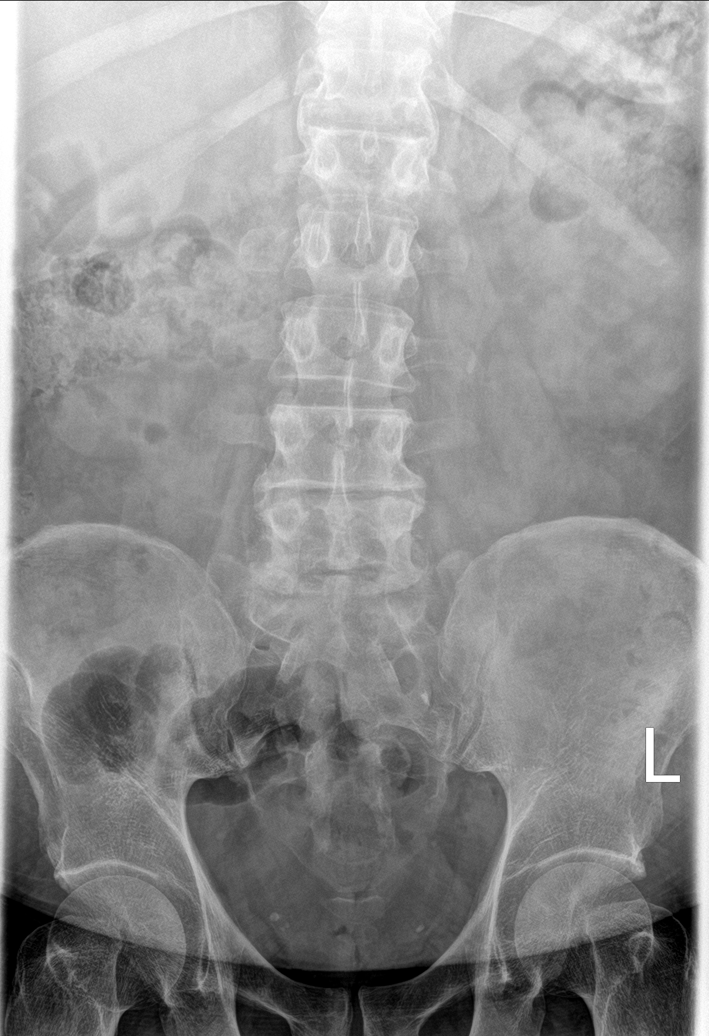

[l-spine obl (1 of 2)]
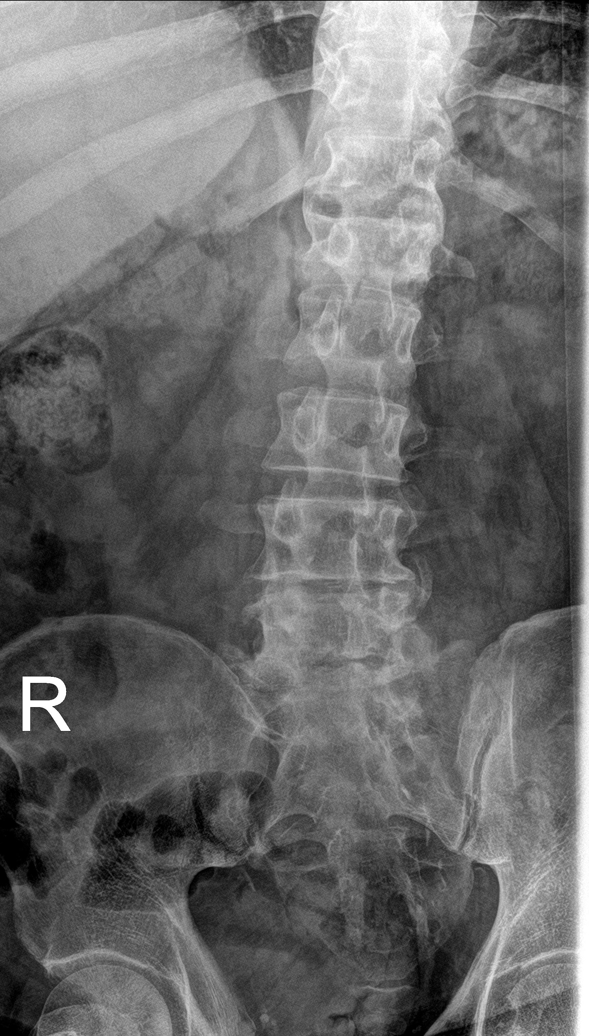

[l-spine obl (2 of 2)]
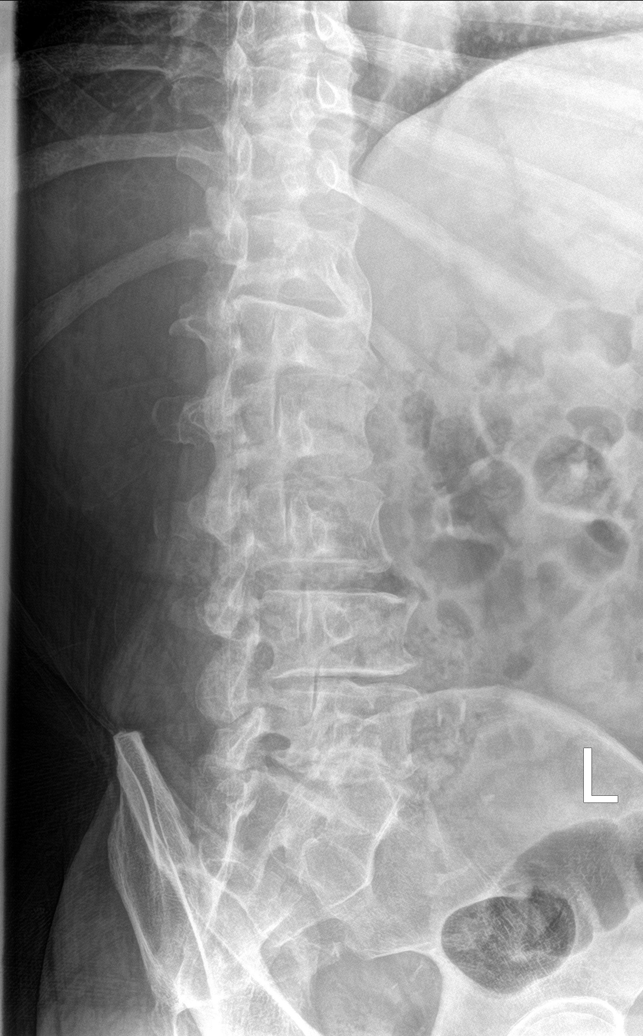

[l-spine lat]
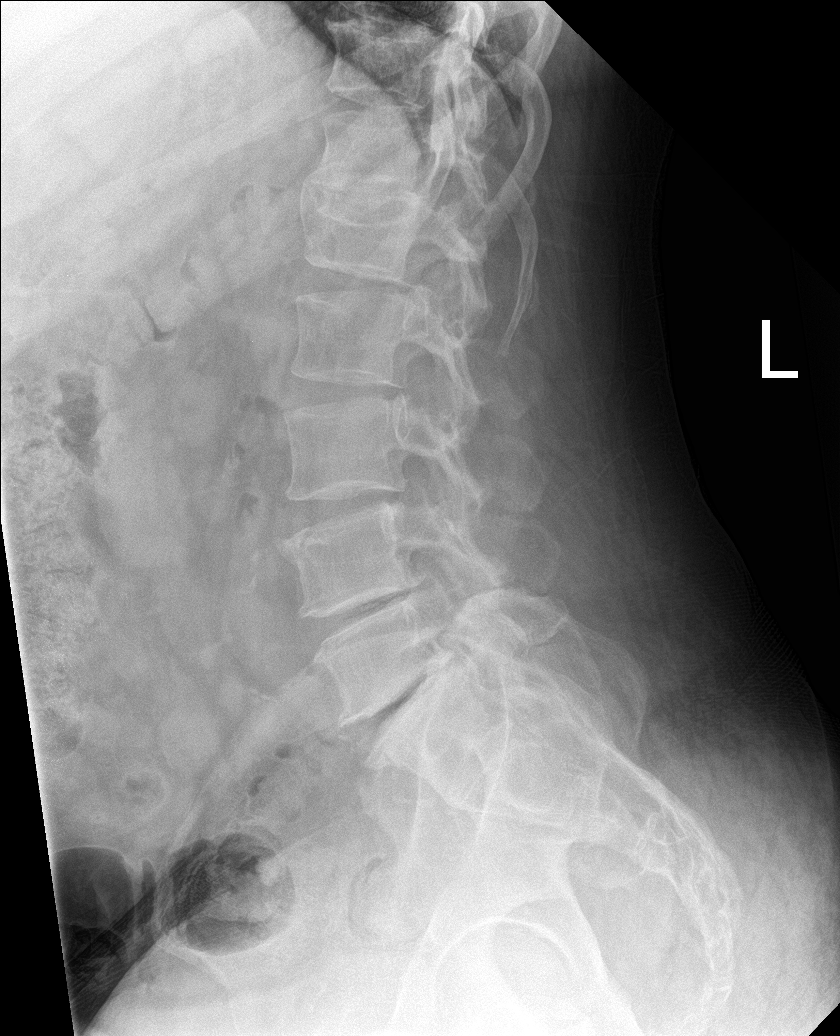

[l-spine spot]
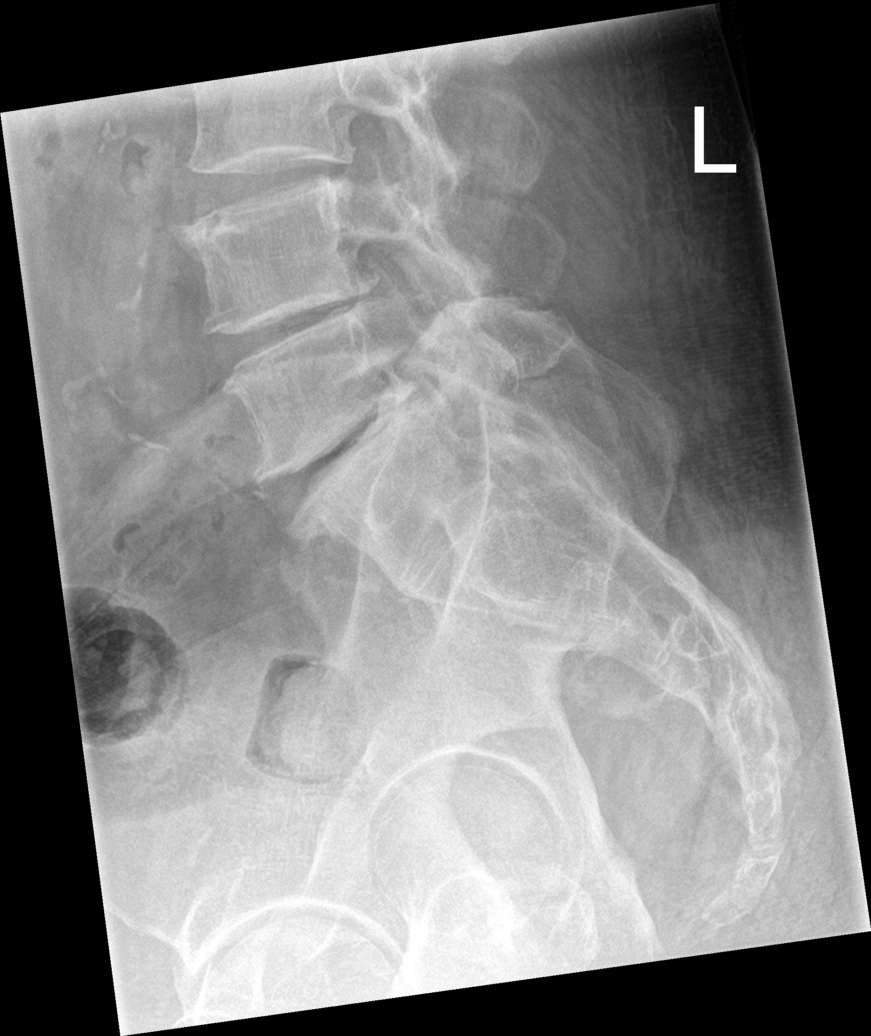

[5 of 5 positions shown; findings below may reference images not displayed]

FINDINGS: Five lumbar type vertebra. There is no acute fracture or subluxation
of the lumbar spine. There are multilevel degenerative changes with
endplate irregularity and disc space narrowing. Disc desiccation and
vacuum phenomena most prominent at L4-L5 and L5-S1. Multilevel
spurring. There is osteophyte at T12-L1. Overall interval
progression of degenerative disc disease and osteophyte compared to
prior radiograph. The visualized posterior elements are intact. The
soft tissues are unremarkable.
IMPRESSION: 1. No acute fracture or subluxation.
2. Degenerative changes, progressed since the prior radiograph.

## 2021-01-19 DIAGNOSIS — Z125 Encounter for screening for malignant neoplasm of prostate: Secondary | ICD-10-CM | POA: Diagnosis not present

## 2021-01-19 DIAGNOSIS — G894 Chronic pain syndrome: Secondary | ICD-10-CM | POA: Diagnosis not present

## 2021-01-19 DIAGNOSIS — E782 Mixed hyperlipidemia: Secondary | ICD-10-CM | POA: Diagnosis not present

## 2021-01-19 DIAGNOSIS — J0101 Acute recurrent maxillary sinusitis: Secondary | ICD-10-CM | POA: Diagnosis not present

## 2021-01-19 DIAGNOSIS — Z23 Encounter for immunization: Secondary | ICD-10-CM | POA: Diagnosis not present

## 2021-01-19 DIAGNOSIS — I10 Essential (primary) hypertension: Secondary | ICD-10-CM | POA: Diagnosis not present

## 2021-01-19 DIAGNOSIS — E1165 Type 2 diabetes mellitus with hyperglycemia: Secondary | ICD-10-CM | POA: Diagnosis not present

## 2021-01-19 DIAGNOSIS — J45909 Unspecified asthma, uncomplicated: Secondary | ICD-10-CM | POA: Diagnosis not present

## 2021-01-19 DIAGNOSIS — Z20822 Contact with and (suspected) exposure to covid-19: Secondary | ICD-10-CM | POA: Diagnosis not present

## 2021-01-19 DIAGNOSIS — E538 Deficiency of other specified B group vitamins: Secondary | ICD-10-CM | POA: Diagnosis not present

## 2021-01-19 DIAGNOSIS — N528 Other male erectile dysfunction: Secondary | ICD-10-CM | POA: Diagnosis not present

## 2021-03-16 ENCOUNTER — Other Ambulatory Visit: Payer: Self-pay | Admitting: Pulmonary Disease

## 2021-04-07 ENCOUNTER — Other Ambulatory Visit (INDEPENDENT_AMBULATORY_CARE_PROVIDER_SITE_OTHER): Payer: Self-pay | Admitting: Orthopedic Surgery

## 2021-04-19 ENCOUNTER — Telehealth: Payer: Self-pay | Admitting: Orthopedic Surgery

## 2021-04-19 NOTE — Telephone Encounter (Signed)
Patient called requesting another referral be sent to pain management again. Patient states they took him off his psych medications and need Dr. Sharol Given to sent referral for medication re evaluation. Please call patient about this matter. So he can explain further in detail. Patient phone number is [phone]. Patient states he believes pain management facility is [facility] and fax number is [phone].

## 2021-04-20 ENCOUNTER — Other Ambulatory Visit: Payer: Self-pay

## 2021-04-20 DIAGNOSIS — M170 Bilateral primary osteoarthritis of knee: Secondary | ICD-10-CM

## 2021-04-20 NOTE — Telephone Encounter (Signed)
Order in chart for pain clinic referral

## 2021-04-22 ENCOUNTER — Telehealth: Payer: Self-pay | Admitting: Orthopedic Surgery

## 2021-04-22 NOTE — Telephone Encounter (Signed)
See message below. Pt was a Artist referral.

## 2021-04-22 NOTE — Telephone Encounter (Signed)
Referral faxed to number listed.

## 2021-04-22 NOTE — Telephone Encounter (Signed)
Patient called with correct fax number. Fax number to resend referral at [phone].

## 2021-05-05 DIAGNOSIS — M25512 Pain in left shoulder: Secondary | ICD-10-CM | POA: Diagnosis not present

## 2021-05-05 DIAGNOSIS — M25562 Pain in left knee: Secondary | ICD-10-CM | POA: Diagnosis not present

## 2021-05-05 DIAGNOSIS — G894 Chronic pain syndrome: Secondary | ICD-10-CM | POA: Diagnosis not present

## 2021-05-06 DIAGNOSIS — E782 Mixed hyperlipidemia: Secondary | ICD-10-CM | POA: Diagnosis not present

## 2021-05-06 DIAGNOSIS — E538 Deficiency of other specified B group vitamins: Secondary | ICD-10-CM | POA: Diagnosis not present

## 2021-05-06 DIAGNOSIS — E1165 Type 2 diabetes mellitus with hyperglycemia: Secondary | ICD-10-CM | POA: Diagnosis not present

## 2021-05-06 DIAGNOSIS — I10 Essential (primary) hypertension: Secondary | ICD-10-CM | POA: Diagnosis not present

## 2021-05-19 DIAGNOSIS — M25512 Pain in left shoulder: Secondary | ICD-10-CM | POA: Diagnosis not present

## 2021-05-19 DIAGNOSIS — G894 Chronic pain syndrome: Secondary | ICD-10-CM | POA: Diagnosis not present

## 2021-05-19 DIAGNOSIS — M25562 Pain in left knee: Secondary | ICD-10-CM | POA: Diagnosis not present

## 2021-06-10 DIAGNOSIS — M25512 Pain in left shoulder: Secondary | ICD-10-CM | POA: Diagnosis not present

## 2021-06-10 DIAGNOSIS — M25562 Pain in left knee: Secondary | ICD-10-CM | POA: Diagnosis not present

## 2021-06-10 DIAGNOSIS — G894 Chronic pain syndrome: Secondary | ICD-10-CM | POA: Diagnosis not present

## 2021-06-12 ENCOUNTER — Other Ambulatory Visit: Payer: Self-pay

## 2021-06-12 ENCOUNTER — Emergency Department (HOSPITAL_COMMUNITY): Payer: Medicare Other

## 2021-06-12 ENCOUNTER — Emergency Department (HOSPITAL_COMMUNITY)
Admission: EM | Admit: 2021-06-12 | Discharge: 2021-06-12 | Disposition: A | Discharge status: Home / Self Care | Payer: Medicare Other | Attending: Emergency Medicine | Admitting: Emergency Medicine

## 2021-06-12 DIAGNOSIS — R739 Hyperglycemia, unspecified: Secondary | ICD-10-CM

## 2021-06-12 DIAGNOSIS — Z7984 Long term (current) use of oral hypoglycemic drugs: Secondary | ICD-10-CM | POA: Diagnosis not present

## 2021-06-12 DIAGNOSIS — J449 Chronic obstructive pulmonary disease, unspecified: Secondary | ICD-10-CM | POA: Insufficient documentation

## 2021-06-12 DIAGNOSIS — R259 Unspecified abnormal involuntary movements: Secondary | ICD-10-CM | POA: Insufficient documentation

## 2021-06-12 DIAGNOSIS — R000 Tachycardia, unspecified: Secondary | ICD-10-CM | POA: Diagnosis not present

## 2021-06-12 DIAGNOSIS — R0902 Hypoxemia: Secondary | ICD-10-CM | POA: Diagnosis not present

## 2021-06-12 DIAGNOSIS — F17210 Nicotine dependence, cigarettes, uncomplicated: Secondary | ICD-10-CM | POA: Insufficient documentation

## 2021-06-12 DIAGNOSIS — I10 Essential (primary) hypertension: Secondary | ICD-10-CM | POA: Diagnosis not present

## 2021-06-12 DIAGNOSIS — Z79899 Other long term (current) drug therapy: Secondary | ICD-10-CM | POA: Diagnosis not present

## 2021-06-12 DIAGNOSIS — J45909 Unspecified asthma, uncomplicated: Secondary | ICD-10-CM | POA: Insufficient documentation

## 2021-06-12 DIAGNOSIS — Z7951 Long term (current) use of inhaled steroids: Secondary | ICD-10-CM | POA: Diagnosis not present

## 2021-06-12 DIAGNOSIS — R410 Disorientation, unspecified: Secondary | ICD-10-CM | POA: Diagnosis not present

## 2021-06-12 DIAGNOSIS — E1165 Type 2 diabetes mellitus with hyperglycemia: Secondary | ICD-10-CM | POA: Insufficient documentation

## 2021-06-12 DIAGNOSIS — R42 Dizziness and giddiness: Secondary | ICD-10-CM | POA: Diagnosis not present

## 2021-06-12 DIAGNOSIS — R110 Nausea: Secondary | ICD-10-CM | POA: Diagnosis not present

## 2021-06-12 DIAGNOSIS — M7989 Other specified soft tissue disorders: Secondary | ICD-10-CM | POA: Diagnosis not present

## 2021-06-12 DIAGNOSIS — Z8673 Personal history of transient ischemic attack (TIA), and cerebral infarction without residual deficits: Secondary | ICD-10-CM | POA: Insufficient documentation

## 2021-06-12 LAB — I-STAT VENOUS BLOOD GAS, ED
Acid-base deficit: 1 mmol/L (ref 0.0–2.0)
Bicarbonate: 26.7 mmol/L (ref 20.0–28.0)
Calcium, Ion: 1.17 mmol/L (ref 1.15–1.40)
HCT: 39 % (ref 39.0–52.0)
Hemoglobin: 13.3 g/dL (ref 13.0–17.0)
O2 Saturation: 84 %
Potassium: 4.4 mmol/L (ref 3.5–5.1)
Sodium: 137 mmol/L (ref 135–145)
TCO2: 28 mmol/L (ref 22–32)
pCO2, Ven: 53.9 mmHg (ref 44.0–60.0)
pH, Ven: 7.303 (ref 7.250–7.430)
pO2, Ven: 55 mmHg — ABNORMAL HIGH (ref 32.0–45.0)

## 2021-06-12 LAB — URINALYSIS, ROUTINE W REFLEX MICROSCOPIC
Bacteria, UA: NONE SEEN
Bilirubin Urine: NEGATIVE
Glucose, UA: 50 mg/dL — AB
Ketones, ur: NEGATIVE mg/dL
Leukocytes,Ua: NEGATIVE
Nitrite: NEGATIVE
Protein, ur: NEGATIVE mg/dL
Specific Gravity, Urine: 1.006 (ref 1.005–1.030)
pH: 6 (ref 5.0–8.0)

## 2021-06-12 LAB — CBC WITH DIFFERENTIAL/PLATELET
Abs Immature Granulocytes: 0.18 10*3/uL — ABNORMAL HIGH (ref 0.00–0.07)
Basophils Absolute: 0.1 10*3/uL (ref 0.0–0.1)
Basophils Relative: 1 %
Eosinophils Absolute: 0.1 10*3/uL (ref 0.0–0.5)
Eosinophils Relative: 1 %
HCT: 38.2 % — ABNORMAL LOW (ref 39.0–52.0)
Hemoglobin: 11.7 g/dL — ABNORMAL LOW (ref 13.0–17.0)
Immature Granulocytes: 2 %
Lymphocytes Relative: 37 %
Lymphs Abs: 4.2 10*3/uL — ABNORMAL HIGH (ref 0.7–4.0)
MCH: 28.5 pg (ref 26.0–34.0)
MCHC: 30.6 g/dL (ref 30.0–36.0)
MCV: 92.9 fL (ref 80.0–100.0)
Monocytes Absolute: 1.1 10*3/uL — ABNORMAL HIGH (ref 0.1–1.0)
Monocytes Relative: 10 %
Neutro Abs: 5.6 10*3/uL (ref 1.7–7.7)
Neutrophils Relative %: 49 %
Platelets: 291 10*3/uL (ref 150–400)
RBC: 4.11 MIL/uL — ABNORMAL LOW (ref 4.22–5.81)
RDW: 15.9 % — ABNORMAL HIGH (ref 11.5–15.5)
WBC: 11.4 10*3/uL — ABNORMAL HIGH (ref 4.0–10.5)
nRBC: 0.6 % — ABNORMAL HIGH (ref 0.0–0.2)

## 2021-06-12 LAB — I-STAT CHEM 8, ED
BUN: 8 mg/dL (ref 6–20)
Calcium, Ion: 1.18 mmol/L (ref 1.15–1.40)
Chloride: 100 mmol/L (ref 98–111)
Creatinine, Ser: 0.5 mg/dL — ABNORMAL LOW (ref 0.61–1.24)
Glucose, Bld: 206 mg/dL — ABNORMAL HIGH (ref 70–99)
HCT: 40 % (ref 39.0–52.0)
Hemoglobin: 13.6 g/dL (ref 13.0–17.0)
Potassium: 4.4 mmol/L (ref 3.5–5.1)
Sodium: 137 mmol/L (ref 135–145)
TCO2: 26 mmol/L (ref 22–32)

## 2021-06-12 LAB — COMPREHENSIVE METABOLIC PANEL
ALT: 109 U/L — ABNORMAL HIGH (ref 0–44)
AST: 34 U/L (ref 15–41)
Albumin: 3 g/dL — ABNORMAL LOW (ref 3.5–5.0)
Alkaline Phosphatase: 118 U/L (ref 38–126)
Anion gap: 9 (ref 5–15)
BUN: 10 mg/dL (ref 6–20)
CO2: 26 mmol/L (ref 22–32)
Calcium: 8.4 mg/dL — ABNORMAL LOW (ref 8.9–10.3)
Chloride: 99 mmol/L (ref 98–111)
Creatinine, Ser: 0.67 mg/dL (ref 0.61–1.24)
GFR, Estimated: >60 mL/min (ref >60)
Glucose, Bld: 212 mg/dL — ABNORMAL HIGH (ref 70–99)
Potassium: 4.3 mmol/L (ref 3.5–5.1)
Sodium: 134 mmol/L — ABNORMAL LOW (ref 135–145)
Total Bilirubin: 0.8 mg/dL (ref 0.3–1.2)
Total Protein: 6.1 g/dL — ABNORMAL LOW (ref 6.5–8.1)

## 2021-06-12 LAB — CBG MONITORING, ED: Glucose-Capillary: 235 mg/dL — ABNORMAL HIGH (ref 70–99)

## 2021-06-12 MED ORDER — SODIUM CHLORIDE 0.9 % IV BOLUS
1000.0000 mL | Freq: Once | INTRAVENOUS | Status: AC
  Administered 2021-06-12: 1000 mL via INTRAVENOUS
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
## 2021-06-12 NOTE — ED Notes (Signed)
Pt left w/o dc paperwork.

## 2021-06-12 NOTE — ED Notes (Signed)
Pt not found in room for d/c instructions. IV not removed by staff

## 2021-06-12 NOTE — ED Notes (Signed)
Patient transported to CT 

## 2021-06-17 ENCOUNTER — Other Ambulatory Visit: Payer: Self-pay

## 2021-06-17 ENCOUNTER — Encounter (HOSPITAL_COMMUNITY): Payer: Self-pay

## 2021-06-17 ENCOUNTER — Emergency Department (HOSPITAL_COMMUNITY): Payer: Medicare Other

## 2021-06-17 ENCOUNTER — Inpatient Hospital Stay (HOSPITAL_COMMUNITY)
Admission: EM | Admit: 2021-06-17 | Discharge: 2021-06-21 | DRG: 189 | Disposition: A | Discharge status: Home / Self Care | Payer: Medicare Other | Attending: Internal Medicine | Admitting: Internal Medicine

## 2021-06-17 DIAGNOSIS — R0902 Hypoxemia: Secondary | ICD-10-CM | POA: Diagnosis not present

## 2021-06-17 DIAGNOSIS — I509 Heart failure, unspecified: Secondary | ICD-10-CM | POA: Diagnosis not present

## 2021-06-17 DIAGNOSIS — N189 Chronic kidney disease, unspecified: Secondary | ICD-10-CM | POA: Diagnosis present

## 2021-06-17 DIAGNOSIS — Z8673 Personal history of transient ischemic attack (TIA), and cerebral infarction without residual deficits: Secondary | ICD-10-CM

## 2021-06-17 DIAGNOSIS — Z7951 Long term (current) use of inhaled steroids: Secondary | ICD-10-CM

## 2021-06-17 DIAGNOSIS — E785 Hyperlipidemia, unspecified: Secondary | ICD-10-CM | POA: Diagnosis present

## 2021-06-17 DIAGNOSIS — I517 Cardiomegaly: Secondary | ICD-10-CM | POA: Diagnosis not present

## 2021-06-17 DIAGNOSIS — R079 Chest pain, unspecified: Secondary | ICD-10-CM | POA: Diagnosis not present

## 2021-06-17 DIAGNOSIS — R0602 Shortness of breath: Secondary | ICD-10-CM | POA: Diagnosis not present

## 2021-06-17 DIAGNOSIS — Z720 Tobacco use: Secondary | ICD-10-CM | POA: Diagnosis present

## 2021-06-17 DIAGNOSIS — R4182 Altered mental status, unspecified: Secondary | ICD-10-CM | POA: Diagnosis not present

## 2021-06-17 DIAGNOSIS — R410 Disorientation, unspecified: Secondary | ICD-10-CM | POA: Diagnosis not present

## 2021-06-17 DIAGNOSIS — I110 Hypertensive heart disease with heart failure: Secondary | ICD-10-CM | POA: Diagnosis not present

## 2021-06-17 DIAGNOSIS — J811 Chronic pulmonary edema: Secondary | ICD-10-CM | POA: Diagnosis not present

## 2021-06-17 DIAGNOSIS — F319 Bipolar disorder, unspecified: Secondary | ICD-10-CM | POA: Diagnosis present

## 2021-06-17 DIAGNOSIS — I10 Essential (primary) hypertension: Secondary | ICD-10-CM | POA: Diagnosis present

## 2021-06-17 DIAGNOSIS — F1110 Opioid abuse, uncomplicated: Secondary | ICD-10-CM | POA: Diagnosis present

## 2021-06-17 DIAGNOSIS — J9601 Acute respiratory failure with hypoxia: Secondary | ICD-10-CM | POA: Diagnosis not present

## 2021-06-17 DIAGNOSIS — I129 Hypertensive chronic kidney disease with stage 1 through stage 4 chronic kidney disease, or unspecified chronic kidney disease: Secondary | ICD-10-CM | POA: Diagnosis present

## 2021-06-17 DIAGNOSIS — F1010 Alcohol abuse, uncomplicated: Secondary | ICD-10-CM | POA: Diagnosis present

## 2021-06-17 DIAGNOSIS — Z789 Other specified health status: Secondary | ICD-10-CM

## 2021-06-17 DIAGNOSIS — Z79899 Other long term (current) drug therapy: Secondary | ICD-10-CM

## 2021-06-17 DIAGNOSIS — F209 Schizophrenia, unspecified: Secondary | ICD-10-CM | POA: Diagnosis present

## 2021-06-17 DIAGNOSIS — F419 Anxiety disorder, unspecified: Secondary | ICD-10-CM | POA: Diagnosis present

## 2021-06-17 DIAGNOSIS — F1411 Cocaine abuse, in remission: Secondary | ICD-10-CM | POA: Diagnosis present

## 2021-06-17 DIAGNOSIS — E1122 Type 2 diabetes mellitus with diabetic chronic kidney disease: Secondary | ICD-10-CM | POA: Diagnosis present

## 2021-06-17 DIAGNOSIS — F1090 Alcohol use, unspecified, uncomplicated: Secondary | ICD-10-CM

## 2021-06-17 DIAGNOSIS — F17210 Nicotine dependence, cigarettes, uncomplicated: Secondary | ICD-10-CM | POA: Diagnosis present

## 2021-06-17 DIAGNOSIS — E119 Type 2 diabetes mellitus without complications: Secondary | ICD-10-CM

## 2021-06-17 DIAGNOSIS — K219 Gastro-esophageal reflux disease without esophagitis: Secondary | ICD-10-CM | POA: Diagnosis present

## 2021-06-17 DIAGNOSIS — Z888 Allergy status to other drugs, medicaments and biological substances status: Secondary | ICD-10-CM

## 2021-06-17 DIAGNOSIS — Z7984 Long term (current) use of oral hypoglycemic drugs: Secondary | ICD-10-CM

## 2021-06-17 DIAGNOSIS — J449 Chronic obstructive pulmonary disease, unspecified: Secondary | ICD-10-CM | POA: Diagnosis present

## 2021-06-17 DIAGNOSIS — Z818 Family history of other mental and behavioral disorders: Secondary | ICD-10-CM

## 2021-06-17 DIAGNOSIS — Z20822 Contact with and (suspected) exposure to covid-19: Secondary | ICD-10-CM | POA: Diagnosis present

## 2021-06-17 LAB — BLOOD GAS, ARTERIAL
Acid-base deficit: 2.6 mmol/L — ABNORMAL HIGH (ref 0.0–2.0)
Allens test (pass/fail): POSITIVE — AB
Bicarbonate: 23.1 mmol/L (ref 20.0–28.0)
O2 Saturation: 95.2 %
Patient temperature: 98.1
pCO2 arterial: 46.4 mmHg (ref 32.0–48.0)
pH, Arterial: 7.318 — ABNORMAL LOW (ref 7.350–7.450)
pO2, Arterial: 80.7 mmHg — ABNORMAL LOW (ref 83.0–108.0)

## 2021-06-17 LAB — URINALYSIS, ROUTINE W REFLEX MICROSCOPIC
Bilirubin Urine: NEGATIVE
Glucose, UA: NEGATIVE mg/dL
Hgb urine dipstick: NEGATIVE
Ketones, ur: NEGATIVE mg/dL
Leukocytes,Ua: NEGATIVE
Nitrite: NEGATIVE
Protein, ur: NEGATIVE mg/dL
Specific Gravity, Urine: 1.026 (ref 1.005–1.030)
pH: 5 (ref 5.0–8.0)

## 2021-06-17 LAB — RAPID URINE DRUG SCREEN, HOSP PERFORMED
Amphetamines: NOT DETECTED
Barbiturates: NOT DETECTED
Benzodiazepines: NOT DETECTED
Cocaine: NOT DETECTED
Opiates: NOT DETECTED
Tetrahydrocannabinol: NOT DETECTED

## 2021-06-17 NOTE — ED Provider Notes (Signed)
Emergency Medicine Provider Triage Evaluation Note  FURKAN KEENUM , a 55 y.o. male  was evaluated in triage.  Pt complains of nausea and some shortness of breath.  Review of Systems  Positive: Nausea, shortness of breath Negative: Abdominal pain, vomiting  Physical Exam  BP (!) 144/83   Pulse 97   Temp 99.1 F (37.3 C) (Oral)   Resp 16   SpO2 94%  Gen:   Awake, no distress   Resp:  Normal effort  MSK:   Moves extremities without difficulty  Other:  Bilateral, pitting lower extremity edema.  Abdominal distention.  Medical Decision Making  Medically screening exam initiated at 9:51 PM.  Appropriate orders placed.  LAKODA MCANANY was informed that the remainder of the evaluation will be completed by another provider, this initial triage assessment does not replace that evaluation, and the importance of remaining in the ED until their evaluation is complete.  Patient seemed confused and had difficulty finishing a complete sentence or thought during the interview. He did mention shortness of breath and nausea, as well as lower extremity edema. He also states he feels confused and has been taking Tylenol whenever he thinks he needs it, but does not know exactly how much Tylenol he has been taking.  Patient was hypoxic on room air down to 87%.   Layla Maw 06/17/21 2153    Charlesetta Shanks, MD 06/22/21 1131

## 2021-06-17 NOTE — ED Triage Notes (Addendum)
Pt reports N/V and generalized abdominal pain x2 weeks. Pt is hypoxic at 87-89% on RA.
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
## 2021-06-18 ENCOUNTER — Inpatient Hospital Stay (HOSPITAL_COMMUNITY): Payer: Medicare Other

## 2021-06-18 DIAGNOSIS — I5033 Acute on chronic diastolic (congestive) heart failure: Secondary | ICD-10-CM

## 2021-06-18 DIAGNOSIS — Z789 Other specified health status: Secondary | ICD-10-CM

## 2021-06-18 DIAGNOSIS — Z7951 Long term (current) use of inhaled steroids: Secondary | ICD-10-CM | POA: Diagnosis not present

## 2021-06-18 DIAGNOSIS — F1110 Opioid abuse, uncomplicated: Secondary | ICD-10-CM | POA: Diagnosis present

## 2021-06-18 DIAGNOSIS — I110 Hypertensive heart disease with heart failure: Secondary | ICD-10-CM | POA: Diagnosis not present

## 2021-06-18 DIAGNOSIS — F209 Schizophrenia, unspecified: Secondary | ICD-10-CM | POA: Diagnosis present

## 2021-06-18 DIAGNOSIS — F1010 Alcohol abuse, uncomplicated: Secondary | ICD-10-CM | POA: Diagnosis present

## 2021-06-18 DIAGNOSIS — F419 Anxiety disorder, unspecified: Secondary | ICD-10-CM | POA: Diagnosis present

## 2021-06-18 DIAGNOSIS — E785 Hyperlipidemia, unspecified: Secondary | ICD-10-CM | POA: Diagnosis present

## 2021-06-18 DIAGNOSIS — F1411 Cocaine abuse, in remission: Secondary | ICD-10-CM

## 2021-06-18 DIAGNOSIS — K219 Gastro-esophageal reflux disease without esophagitis: Secondary | ICD-10-CM | POA: Diagnosis present

## 2021-06-18 DIAGNOSIS — Z818 Family history of other mental and behavioral disorders: Secondary | ICD-10-CM | POA: Diagnosis not present

## 2021-06-18 DIAGNOSIS — E1122 Type 2 diabetes mellitus with diabetic chronic kidney disease: Secondary | ICD-10-CM | POA: Diagnosis present

## 2021-06-18 DIAGNOSIS — J9601 Acute respiratory failure with hypoxia: Principal | ICD-10-CM

## 2021-06-18 DIAGNOSIS — F319 Bipolar disorder, unspecified: Secondary | ICD-10-CM | POA: Diagnosis present

## 2021-06-18 DIAGNOSIS — Z79899 Other long term (current) drug therapy: Secondary | ICD-10-CM | POA: Diagnosis not present

## 2021-06-18 DIAGNOSIS — I10 Essential (primary) hypertension: Secondary | ICD-10-CM | POA: Diagnosis not present

## 2021-06-18 DIAGNOSIS — R0602 Shortness of breath: Secondary | ICD-10-CM | POA: Diagnosis not present

## 2021-06-18 DIAGNOSIS — I509 Heart failure, unspecified: Secondary | ICD-10-CM

## 2021-06-18 DIAGNOSIS — F17210 Nicotine dependence, cigarettes, uncomplicated: Secondary | ICD-10-CM | POA: Diagnosis present

## 2021-06-18 DIAGNOSIS — I129 Hypertensive chronic kidney disease with stage 1 through stage 4 chronic kidney disease, or unspecified chronic kidney disease: Secondary | ICD-10-CM | POA: Diagnosis present

## 2021-06-18 DIAGNOSIS — Z20822 Contact with and (suspected) exposure to covid-19: Secondary | ICD-10-CM | POA: Diagnosis present

## 2021-06-18 DIAGNOSIS — Z8673 Personal history of transient ischemic attack (TIA), and cerebral infarction without residual deficits: Secondary | ICD-10-CM | POA: Diagnosis not present

## 2021-06-18 DIAGNOSIS — E119 Type 2 diabetes mellitus without complications: Secondary | ICD-10-CM

## 2021-06-18 DIAGNOSIS — J449 Chronic obstructive pulmonary disease, unspecified: Secondary | ICD-10-CM | POA: Diagnosis present

## 2021-06-18 DIAGNOSIS — Z7289 Other problems related to lifestyle: Secondary | ICD-10-CM | POA: Diagnosis not present

## 2021-06-18 DIAGNOSIS — N189 Chronic kidney disease, unspecified: Secondary | ICD-10-CM | POA: Diagnosis present

## 2021-06-18 DIAGNOSIS — Z888 Allergy status to other drugs, medicaments and biological substances status: Secondary | ICD-10-CM | POA: Diagnosis not present

## 2021-06-18 DIAGNOSIS — Z7984 Long term (current) use of oral hypoglycemic drugs: Secondary | ICD-10-CM | POA: Diagnosis not present

## 2021-06-18 LAB — PHOSPHORUS: Phosphorus: 2.8 mg/dL (ref 2.5–4.6)

## 2021-06-18 LAB — CBC WITH DIFFERENTIAL/PLATELET
Abs Immature Granulocytes: 0.1 10*3/uL — ABNORMAL HIGH (ref 0.00–0.07)
Basophils Absolute: 0 10*3/uL (ref 0.0–0.1)
Basophils Relative: 0 %
Eosinophils Absolute: 0 10*3/uL (ref 0.0–0.5)
Eosinophils Relative: 0 %
HCT: 41.5 % (ref 39.0–52.0)
Hemoglobin: 12.5 g/dL — ABNORMAL LOW (ref 13.0–17.0)
Immature Granulocytes: 1 %
Lymphocytes Relative: 28 %
Lymphs Abs: 2.8 10*3/uL (ref 0.7–4.0)
MCH: 28.2 pg (ref 26.0–34.0)
MCHC: 30.1 g/dL (ref 30.0–36.0)
MCV: 93.5 fL (ref 80.0–100.0)
Monocytes Absolute: 0.9 10*3/uL (ref 0.1–1.0)
Monocytes Relative: 9 %
Neutro Abs: 6.2 10*3/uL (ref 1.7–7.7)
Neutrophils Relative %: 62 %
Platelets: 324 10*3/uL (ref 150–400)
RBC: 4.44 MIL/uL (ref 4.22–5.81)
RDW: 16.3 % — ABNORMAL HIGH (ref 11.5–15.5)
WBC: 10.1 10*3/uL (ref 4.0–10.5)
nRBC: 2 % — ABNORMAL HIGH (ref 0.0–0.2)

## 2021-06-18 LAB — COMPREHENSIVE METABOLIC PANEL
ALT: 41 U/L (ref 0–44)
AST: 29 U/L (ref 15–41)
Albumin: 3.5 g/dL (ref 3.5–5.0)
Alkaline Phosphatase: 103 U/L (ref 38–126)
Anion gap: 10 (ref 5–15)
BUN: 23 mg/dL — ABNORMAL HIGH (ref 6–20)
CO2: 25 mmol/L (ref 22–32)
Calcium: 8.1 mg/dL — ABNORMAL LOW (ref 8.9–10.3)
Chloride: 97 mmol/L — ABNORMAL LOW (ref 98–111)
Creatinine, Ser: 1.18 mg/dL (ref 0.61–1.24)
GFR, Estimated: >60 mL/min (ref >60)
Glucose, Bld: 169 mg/dL — ABNORMAL HIGH (ref 70–99)
Potassium: 4.2 mmol/L (ref 3.5–5.1)
Sodium: 132 mmol/L — ABNORMAL LOW (ref 135–145)
Total Bilirubin: 0.8 mg/dL (ref 0.3–1.2)
Total Protein: 7.1 g/dL (ref 6.5–8.1)

## 2021-06-18 LAB — GLUCOSE, CAPILLARY
Glucose-Capillary: 84 mg/dL (ref 70–99)
Glucose-Capillary: 225 mg/dL — ABNORMAL HIGH (ref 70–99)
Glucose-Capillary: 92 mg/dL (ref 70–99)
Glucose-Capillary: 153 mg/dL — ABNORMAL HIGH (ref 70–99)

## 2021-06-18 LAB — HIV ANTIBODY (ROUTINE TESTING W REFLEX): HIV Screen 4th Generation wRfx: NONREACTIVE

## 2021-06-18 LAB — ETHANOL: Alcohol, Ethyl (B): <10 mg/dL (ref <10)

## 2021-06-18 LAB — MAGNESIUM: Magnesium: 1.5 mg/dL — ABNORMAL LOW (ref 1.7–2.4)

## 2021-06-18 LAB — HEMOGLOBIN A1C
Hgb A1c MFr Bld: 8 % — ABNORMAL HIGH (ref 4.8–5.6)
Mean Plasma Glucose: 182.9 mg/dL

## 2021-06-18 LAB — ACETAMINOPHEN LEVEL: Acetaminophen (Tylenol), Serum: 27 ug/mL (ref 10–30)

## 2021-06-18 LAB — AMMONIA: Ammonia: 30 umol/L (ref 9–35)

## 2021-06-18 LAB — ECHOCARDIOGRAM COMPLETE
Area-P 1/2: 6.96 cm2
Height: 73 in
P 1/2 time: 259 msec
S' Lateral: 4.3 cm
Weight: 3555.2 oz

## 2021-06-18 LAB — RESP PANEL BY RT-PCR (FLU A&B, COVID) ARPGX2
Influenza A by PCR: NEGATIVE
Influenza B by PCR: NEGATIVE
SARS Coronavirus 2 by RT PCR: NEGATIVE

## 2021-06-18 LAB — LIPASE, BLOOD: Lipase: 23 U/L (ref 11–51)

## 2021-06-18 LAB — SALICYLATE LEVEL: Salicylate Lvl: <7 mg/dL — ABNORMAL LOW (ref 7.0–30.0)

## 2021-06-18 LAB — TROPONIN I (HIGH SENSITIVITY)
Troponin I (High Sensitivity): 26 ng/L — ABNORMAL HIGH (ref <18)
Troponin I (High Sensitivity): 27 ng/L — ABNORMAL HIGH (ref <18)
Troponin I (High Sensitivity): 25 ng/L — ABNORMAL HIGH (ref <18)

## 2021-06-18 LAB — BRAIN NATRIURETIC PEPTIDE: B Natriuretic Peptide: 532.8 pg/mL — ABNORMAL HIGH (ref 0.0–100.0)

## 2021-06-18 MED ORDER — SODIUM CHLORIDE 0.9% FLUSH
3.0000 mL | Freq: Two times a day (BID) | INTRAVENOUS | Status: DC
  Administered 2021-06-18 – 2021-06-21 (×8): 3 mL via INTRAVENOUS

## 2021-06-18 MED ORDER — SODIUM CHLORIDE 0.9 % IV SOLN: 250.0000 mL | INTRAVENOUS | Status: DC | PRN

## 2021-06-18 MED ORDER — LORAZEPAM 1 MG PO TABS
1.0000 mg | ORAL_TABLET | ORAL | Status: DC | PRN
  Administered 2021-06-18: 2 mg via ORAL
  Filled 2021-06-18: qty 2

## 2021-06-18 MED ORDER — FUROSEMIDE 10 MG/ML IJ SOLN
40.0000 mg | INTRAMUSCULAR | Status: AC
  Administered 2021-06-18: 40 mg via INTRAVENOUS
  Filled 2021-06-18: qty 4

## 2021-06-18 MED ORDER — FOLIC ACID 1 MG PO TABS
1.0000 mg | ORAL_TABLET | Freq: Every day | ORAL | Status: DC
  Administered 2021-06-18 – 2021-06-21 (×4): 1 mg via ORAL
  Filled 2021-06-18 (×4): qty 1

## 2021-06-18 MED ORDER — SODIUM CHLORIDE 0.9% FLUSH: 3.0000 mL | INTRAVENOUS | Status: DC | PRN

## 2021-06-18 MED ORDER — ADULT MULTIVITAMIN W/MINERALS CH
1.0000 | ORAL_TABLET | Freq: Every day | ORAL | Status: DC
  Administered 2021-06-18 – 2021-06-21 (×4): 1 via ORAL
  Filled 2021-06-18 (×4): qty 1

## 2021-06-18 MED ORDER — THIAMINE HCL 100 MG PO TABS
100.0000 mg | ORAL_TABLET | Freq: Every day | ORAL | Status: DC
  Administered 2021-06-18 – 2021-06-21 (×4): 100 mg via ORAL
  Filled 2021-06-18 (×4): qty 1

## 2021-06-18 MED ORDER — LORAZEPAM 2 MG/ML IJ SOLN
1.0000 mg | INTRAMUSCULAR | Status: DC | PRN
  Administered 2021-06-18: 3 mg via INTRAVENOUS
  Administered 2021-06-18 (×3): 2 mg via INTRAVENOUS
  Filled 2021-06-18: qty 1
  Filled 2021-06-18: qty 2
  Filled 2021-06-18 (×2): qty 1

## 2021-06-18 MED ORDER — ALPRAZOLAM 0.25 MG PO TABS
0.2500 mg | ORAL_TABLET | Freq: Three times a day (TID) | ORAL | Status: DC | PRN
  Filled 2021-06-18: qty 4

## 2021-06-18 MED ORDER — THIAMINE HCL 100 MG/ML IJ SOLN: 100.0000 mg | Freq: Every day | INTRAMUSCULAR | Status: DC

## 2021-06-18 MED ORDER — ONDANSETRON HCL 4 MG/2ML IJ SOLN: 4.0000 mg | Freq: Four times a day (QID) | INTRAMUSCULAR | Status: DC | PRN

## 2021-06-18 MED ORDER — FUROSEMIDE 10 MG/ML IJ SOLN
40.0000 mg | Freq: Two times a day (BID) | INTRAMUSCULAR | Status: DC
  Administered 2021-06-18 – 2021-06-21 (×8): 40 mg via INTRAVENOUS
  Filled 2021-06-18 (×8): qty 4

## 2021-06-18 MED ORDER — INSULIN ASPART 100 UNIT/ML IJ SOLN
0.0000 [IU] | Freq: Three times a day (TID) | INTRAMUSCULAR | Status: DC
  Administered 2021-06-18 – 2021-06-19 (×2): 5 [IU] via SUBCUTANEOUS
  Administered 2021-06-19 (×2): 2 [IU] via SUBCUTANEOUS
  Administered 2021-06-20: 3 [IU] via SUBCUTANEOUS
  Administered 2021-06-20: 5 [IU] via SUBCUTANEOUS
  Administered 2021-06-20 – 2021-06-21 (×3): 3 [IU] via SUBCUTANEOUS
  Administered 2021-06-21: 5 [IU] via SUBCUTANEOUS

## 2021-06-18 MED ORDER — ENOXAPARIN SODIUM 40 MG/0.4ML IJ SOSY
40.0000 mg | PREFILLED_SYRINGE | INTRAMUSCULAR | Status: DC
  Administered 2021-06-18 – 2021-06-21 (×4): 40 mg via SUBCUTANEOUS
  Filled 2021-06-18 (×4): qty 0.4

## 2021-06-18 MED ORDER — ACETAMINOPHEN 325 MG PO TABS
650.0000 mg | ORAL_TABLET | ORAL | Status: DC | PRN
  Administered 2021-06-19 – 2021-06-21 (×4): 650 mg via ORAL
  Filled 2021-06-18 (×4): qty 2

## 2021-06-18 NOTE — Assessment & Plan Note (Signed)
-   A1c 8% on admission - Continue SSI and CBG monitoring -Follow-up med rec but looks to be on metformin at home already

## 2021-06-18 NOTE — Hospital Course (Addendum)
Jacob [name] is a 55 y.o. male with medical history significant of HTN, DMII, HLD, prior cocaine and opiate abuse. Ongoing alcohol use.  He presented to the hospital with worsening shortness of breath that has been slowly progressing for the past few weeks prior to admission.  He endorsed dyspnea on exertion, orthopnea, and worsening lower extremity edema. He admits to ongoing alcohol use with approximately half a pint of gin daily.  States that he has not done cocaine in approximately 9 months but has a history of severe cocaine use. He also continues to smoke cigarettes, approximately 0.5 PPD for approximately 40 years.  CXR on admission showed questionable pulmonary edema and vascular congestion.  BNP was elevated, 532.  Troponins were indeterminately elevated in the mid 20s.  He was mildly hypoxic, 87% on room air and placed on oxygen on admission. He was admitted for presumed new diagnosis CHF and started on IV Lasix and echo was ordered. His echo showed normal EF and no DD.

## 2021-06-18 NOTE — Assessment & Plan Note (Signed)
-   Approximately 20-pack-year smoking history (0.5 PPD x40 years) - Patient wants to use his Nicotrol inhaler, if so will need to be checked into pharmacy to then dispense otherwise he may have a nicotine patch

## 2021-06-18 NOTE — Progress Notes (Signed)
  Echocardiogram 2D Echocardiogram has been performed.  Jacob [name] [place] 06/18/2021, 11:36 AM

## 2021-06-18 NOTE — Assessment & Plan Note (Signed)
-   Differential includes volume overload with vascular congestion on CXR versus component of undiagnosed COPD given approximately 20-pack-year smoking history - Wean oxygen off as able - Continuing diuresis for now

## 2021-06-18 NOTE — Progress Notes (Signed)
Progress Note    Jacob [name]   VQM:[id number]  DOB: December 16, 1966  DOA: 06/17/2021     0  PCP: [name], MD  CC: SOB  Hospital Course: Jacob [name] is a 55 y.o. male with medical history significant of HTN, DMII, HLD, prior cocaine and opiate abuse. Ongoing alcohol use.  He presented to the hospital with worsening shortness of breath that has been slowly progressing for the past few weeks prior to admission.  He endorsed dyspnea on exertion, orthopnea, and worsening lower extremity edema. He admits to ongoing alcohol use with approximately half a pint of gin daily.  States that he has not done cocaine in approximately 9 months but has a history of severe cocaine use. He also continues to smoke cigarettes, approximately 0.5 PPD for approximately 40 years.  CXR on admission showed questionable pulmonary edema and vascular congestion.  BNP was elevated, 532.  Troponins were indeterminately elevated in the mid 20s.  He was mildly hypoxic, 87% on room air and placed on oxygen on admission. He was admitted for presumed new diagnosis CHF and started on IV Lasix and echo was ordered.  Interval History:  Seen in his room this morning.  States that his breathing is improved and he is feeling more comfortable.  He has been voiding well since being given Lasix and notes some improvement in his swelling.  ROS: Constitutional: negative for chills and fevers, Respiratory: positive for cough and dyspnea on exertion, negative for hemoptysis and wheezing, Cardiovascular: negative for chest pain, and Gastrointestinal: negative for abdominal pain  Assessment & Plan: * SOB (shortness of breath) - Initial concern was for new onset CHF however echo obtained on admission and is essentially unremarkable.  EF 60 to 65%, normal diastology. -Given some vascular congestion on CXR, there does appear to be some component of some volume overload as well as some lower extremity edema on  exam -Other considered differential would be underlying component of COPD and/or some flaring although lung exam does not sound too consistent, but he was mildly hypoxic -For now, continue on diuresis with Lasix.  Hold off on steroids for any COPD component -Monitor urine output and wean oxygen off as able  Acute respiratory failure with hypoxia (HCC) - Differential includes volume overload with vascular congestion on CXR versus component of undiagnosed COPD given approximately 20-pack-year smoking history - Wean oxygen off as able - Continuing diuresis for now  Alcohol use - Patient endorses drinking approximately 1/2 pint of gin daily - Continue on CIWA protocol - Patient was counseled to significantly cut back on alcohol intake as well  DMII (diabetes mellitus, type 2) (HCC) - A1c 8% on admission - Continue SSI and CBG monitoring -Follow-up med rec but looks to be on metformin at home already  History of cocaine abuse [facility]) - Patient states he has been abstinent for approximately 9 months - UDS negative on admission as well  Tobacco use - Approximately 20-pack-year smoking history (0.5 PPD x40 years) - Patient wants to use his Nicotrol inhaler, if so will need to be checked into pharmacy to then dispense otherwise he may have a nicotine patch  Essential hypertension - Will start on blood pressure medications if needed    Old records reviewed in assessment of this patient  Antimicrobials:   DVT prophylaxis: enoxaparin (LOVENOX) injection 40 mg Start: 06/18/21 1000   Code Status:   Code Status: Full Code Family Communication:   Disposition Plan: Status is: Inpatient  Remains inpatient  appropriate because:Ongoing diagnostic testing needed not appropriate for outpatient work up, IV treatments appropriate due to intensity of illness or inability to take PO, and Inpatient level of care appropriate due to severity of illness  Dispo: The patient is from: Home               Anticipated d/c is to: Home              Patient currently is not medically stable to d/c.   Difficult to place patient No      Risk of unplanned readmission score: Unplanned Admission- Pilot do not use: 24.39   Objective: Blood pressure (!) 143/87, pulse (!) 101, temperature 97.9 F (36.6 C), temperature source Oral, resp. rate 18, height 6\' 1"  (1.854 m), weight 100.8 kg, SpO2 94 %.  Examination: General appearance: alert, cooperative, no distress, and impulsive Head: Normocephalic, without obvious abnormality, atraumatic Eyes:  EOMI Lungs:  No significant wheezing.  Mild scattered crackles Heart: regular rate and rhythm and S1, S2 normal Abdomen:  Obese, soft, nontender, nondistended, bowel sounds distant but present Extremities:  Trace to 1+ lower extremity bilateral pitting edema Skin: mobility and turgor normal Neurologic: Grossly normal  Consultants:    Procedures:    Data Reviewed: I have personally reviewed following labs and imaging studies Results for orders placed or performed during the hospital encounter of 06/17/21 (from the past 24 hour(s))  Urinalysis, Routine w reflex microscopic Urine, Clean Catch     Status: Abnormal   Collection Time: 06/17/21  9:51 PM  Result Value Ref Range   Color, Urine AMBER (A) YELLOW   APPearance HAZY (A) CLEAR   Specific Gravity, Urine 1.026 1.005 - 1.030   pH 5.0 5.0 - 8.0   Glucose, UA NEGATIVE NEGATIVE mg/dL   Hgb urine dipstick NEGATIVE NEGATIVE   Bilirubin Urine NEGATIVE NEGATIVE   Ketones, ur NEGATIVE NEGATIVE mg/dL   Protein, ur NEGATIVE NEGATIVE mg/dL   Nitrite NEGATIVE NEGATIVE   Leukocytes,Ua NEGATIVE NEGATIVE  Urine rapid drug screen (hosp performed)     Status: None   Collection Time: 06/17/21  9:51 PM  Result Value Ref Range   Opiates NONE DETECTED NONE DETECTED   Cocaine NONE DETECTED NONE DETECTED   Benzodiazepines NONE DETECTED NONE DETECTED   Amphetamines NONE DETECTED NONE DETECTED    Tetrahydrocannabinol NONE DETECTED NONE DETECTED   Barbiturates NONE DETECTED NONE DETECTED  Blood gas, arterial (at [facility])     Status: Abnormal   Collection Time: 06/17/21 11:02 PM  Result Value Ref Range   Delivery systems NASAL CANNULA    pH, Arterial 7.318 (L) 7.350 - 7.450   pCO2 arterial 46.4 32.0 - 48.0 mmHg   pO2, Arterial 80.7 (L) 83.0 - 108.0 mmHg   Bicarbonate 23.1 20.0 - 28.0 mmol/L   Acid-base deficit 2.6 (H) 0.0 - 2.0 mmol/L   O2 Saturation 95.2 %   Patient temperature 98.1    Collection site LEFT RADIAL    Allens test (pass/fail) POSITIVE (A) PASS  Troponin I (High Sensitivity)     Status: Abnormal   Collection Time: 06/17/21 11:51 PM  Result Value Ref Range   Troponin I (High Sensitivity) 27 (H) <18 ng/L  Acetaminophen level     Status: None   Collection Time: 06/17/21 11:57 PM  Result Value Ref Range   Acetaminophen (Tylenol), Serum 27 10 - 30 ug/mL  Ethanol     Status: None   Collection Time: 06/17/21 11:57 PM  Result Value Ref  Range   Alcohol, Ethyl (B) <10 <10 mg/dL  Comprehensive metabolic panel     Status: Abnormal   Collection Time: 06/17/21 11:57 PM  Result Value Ref Range   Sodium 132 (L) 135 - 145 mmol/L   Potassium 4.2 3.5 - 5.1 mmol/L   Chloride 97 (L) 98 - 111 mmol/L   CO2 25 22 - 32 mmol/L   Glucose, Bld 169 (H) 70 - 99 mg/dL   BUN 23 (H) 6 - 20 mg/dL   Creatinine, Ser 1.18 0.61 - 1.24 mg/dL   Calcium 8.1 (L) 8.9 - 10.3 mg/dL   Total Protein 7.1 6.5 - 8.1 g/dL   Albumin 3.5 3.5 - 5.0 g/dL   AST 29 15 - 41 U/L   ALT 41 0 - 44 U/L   Alkaline Phosphatase 103 38 - 126 U/L   Total Bilirubin 0.8 0.3 - 1.2 mg/dL   GFR, Estimated >60 >60 mL/min   Anion gap 10 5 - 15  Troponin I (High Sensitivity)     Status: Abnormal   Collection Time: 06/17/21 11:57 PM  Result Value Ref Range   Troponin I (High Sensitivity) 26 (H) <08 ng/L  Salicylate level     Status: Abnormal   Collection Time: 06/17/21 11:57 PM  Result Value Ref Range   Salicylate Lvl  <6.5 (L) 7.0 - 30.0 mg/dL  Lipase, blood     Status: None   Collection Time: 06/17/21 11:57 PM  Result Value Ref Range   Lipase 23 11 - 51 U/L  CBC with Differential     Status: Abnormal   Collection Time: 06/17/21 11:57 PM  Result Value Ref Range   WBC 10.1 4.0 - 10.5 K/uL   RBC 4.44 4.22 - 5.81 MIL/uL   Hemoglobin 12.5 (L) 13.0 - 17.0 g/dL   HCT 41.5 39.0 - 52.0 %   MCV 93.5 80.0 - 100.0 fL   MCH 28.2 26.0 - 34.0 pg   MCHC 30.1 30.0 - 36.0 g/dL   RDW 16.3 (H) 11.5 - 15.5 %   Platelets 324 150 - 400 K/uL   nRBC 2.0 (H) 0.0 - 0.2 %   Neutrophils Relative % 62 %   Neutro Abs 6.2 1.7 - 7.7 K/uL   Lymphocytes Relative 28 %   Lymphs Abs 2.8 0.7 - 4.0 K/uL   Monocytes Relative 9 %   Monocytes Absolute 0.9 0.1 - 1.0 K/uL   Eosinophils Relative 0 %   Eosinophils Absolute 0.0 0.0 - 0.5 K/uL   Basophils Relative 0 %   Basophils Absolute 0.0 0.0 - 0.1 K/uL   Immature Granulocytes 1 %   Abs Immature Granulocytes 0.10 (H) 0.00 - 0.07 K/uL  Brain natriuretic peptide     Status: Abnormal   Collection Time: 06/17/21 11:57 PM  Result Value Ref Range   B Natriuretic Peptide 532.8 (H) 0.0 - 100.0 pg/mL  Ammonia     Status: None   Collection Time: 06/17/21 11:57 PM  Result Value Ref Range   Ammonia 30 9 - 35 umol/L  Resp Panel by RT-PCR (Flu A&B, Covid) Nasopharyngeal Swab     Status: None   Collection Time: 06/17/21 11:57 PM   Specimen: Nasopharyngeal Swab; Nasopharyngeal(NP) swabs in vial transport medium  Result Value Ref Range   SARS Coronavirus 2 by RT PCR NEGATIVE NEGATIVE   Influenza A by PCR NEGATIVE NEGATIVE   Influenza B by PCR NEGATIVE NEGATIVE  Troponin I (High Sensitivity)     Status: Abnormal   Collection  Time: 06/18/21  1:47 AM  Result Value Ref Range   Troponin I (High Sensitivity) 25 (H) <18 ng/L  Magnesium     Status: Abnormal   Collection Time: 06/18/21  1:48 AM  Result Value Ref Range   Magnesium 1.5 (L) 1.7 - 2.4 mg/dL  Phosphorus     Status: None   Collection  Time: 06/18/21  1:48 AM  Result Value Ref Range   Phosphorus 2.8 2.5 - 4.6 mg/dL  HIV Antibody (routine testing w rflx)     Status: None   Collection Time: 06/18/21  2:44 AM  Result Value Ref Range   HIV Screen 4th Generation wRfx Non Reactive Non Reactive  Hemoglobin A1c     Status: Abnormal   Collection Time: 06/18/21  4:14 AM  Result Value Ref Range   Hgb A1c MFr Bld 8.0 (H) 4.8 - 5.6 %   Mean Plasma Glucose 182.9 mg/dL  Glucose, capillary     Status: None   Collection Time: 06/18/21  7:59 AM  Result Value Ref Range   Glucose-Capillary 84 70 - 99 mg/dL  Glucose, capillary     Status: Abnormal   Collection Time: 06/18/21 12:17 PM  Result Value Ref Range   Glucose-Capillary 225 (H) 70 - 99 mg/dL    Recent Results (from the past 240 hour(s))  Resp Panel by RT-PCR (Flu A&B, Covid) Nasopharyngeal Swab     Status: None   Collection Time: 06/17/21 11:57 PM   Specimen: Nasopharyngeal Swab; Nasopharyngeal(NP) swabs in vial transport medium  Result Value Ref Range Status   SARS Coronavirus 2 by RT PCR NEGATIVE NEGATIVE Final    Comment: (NOTE) SARS-CoV-2 target nucleic acids are NOT DETECTED.  The SARS-CoV-2 RNA is generally detectable in upper respiratory specimens during the acute phase of infection. The lowest concentration of SARS-CoV-2 viral copies this assay can detect is 138 copies/mL. A negative result does not preclude SARS-Cov-2 infection and should not be used as the sole basis for treatment or other patient management decisions. A negative result may occur with  improper specimen collection/handling, submission of specimen other than nasopharyngeal swab, presence of viral mutation(s) within the areas targeted by this assay, and inadequate number of viral copies(<138 copies/mL). A negative result must be combined with clinical observations, patient history, and epidemiological information. The expected result is Negative.  Fact Sheet for Patients:   EntrepreneurPulse.com.au  Fact Sheet for Healthcare Providers:  IncredibleEmployment.be  This test is no t yet approved or cleared by the [place] FDA and  has been authorized for detection and/or diagnosis of SARS-CoV-2 by FDA under an Emergency Use Authorization (EUA). This EUA will remain  in effect (meaning this test can be used) for the duration of the COVID-19 declaration under Section 564(b)(1) of the Act, 21 U.S.C.section 360bbb-3(b)(1), unless the authorization is terminated  or revoked sooner.       Influenza A by PCR NEGATIVE NEGATIVE Final   Influenza B by PCR NEGATIVE NEGATIVE Final    Comment: (NOTE) The Xpert Xpress SARS-CoV-2/FLU/RSV plus assay is intended as an aid in the diagnosis of influenza from Nasopharyngeal swab specimens and should not be used as a sole basis for treatment. Nasal washings and aspirates are unacceptable for Xpert Xpress SARS-CoV-2/FLU/RSV testing.  Fact Sheet for Patients: EntrepreneurPulse.com.au  Fact Sheet for Healthcare Providers: IncredibleEmployment.be  This test is not yet approved or cleared by the [place] FDA and has been authorized for detection and/or diagnosis of SARS-CoV-2 by FDA under an Emergency Use  Authorization (EUA). This EUA will remain in effect (meaning this test can be used) for the duration of the COVID-19 declaration under Section 564(b)(1) of the Act, 21 U.S.C. section 360bbb-3(b)(1), unless the authorization is terminated or revoked.  Performed at [facility], [place] [address], [place], [place] [address]      Radiology Studies: DG Chest 2 View  Result Date: 06/17/2021 CLINICAL DATA:  Hypoxia, confusion, chest pain and altered mental status EXAM: CHEST - 2 VIEW COMPARISON:  Radiograph 08/31/2020 FINDINGS: Some hazy interstitial opacities are present in the mid to lower lungs. Slight pulmonary vascular  congestion. No pneumothorax or visible effusion. Cardiomegaly with otherwise unremarkable cardiomediastinal contours. No acute osseous or soft tissue abnormality. IMPRESSION: Hazy opacities in the mid to lower lungs, could reflect atelectasis versus developing edema given pulmonary vascular congestion and cardiomegaly. Infection less favored though not fully excluded in the appropriate clinical context. Electronically Signed   By: Lovena Le M.D.   On: 06/17/2021 22:12   ECHOCARDIOGRAM COMPLETE  Result Date: 06/18/2021    ECHOCARDIOGRAM REPORT   Patient Name:   RAQUON MILLEDGE Date of Exam: 06/18/2021 Medical Rec #:  [id number]           Height:       73.0 in Accession #:    [id number]          Weight:       222.2 lb Date of Birth:  [birth year]           BSA:          2.250 m Patient Age:    38 years            BP:           105/57 mmHg Patient Gender: M                   HR:           97 bpm. Exam Location:  Inpatient Procedure: 2D Echo, Cardiac Doppler and Color Doppler Indications:    I50.33 Acute on chronic diastolic (congestive) heart failure  History:        Patient has prior history of Echocardiogram examinations, most                 recent 07/17/2016. Stroke; Risk Factors:Hypertension, Diabetes                 and Dyslipidemia. Opioid abuse.  Sonographer:    Jonelle Sidle Dance Referring Phys: [place]  1. Left ventricular ejection fraction, by estimation, is 60 to 65%. The left ventricle has normal function. The left ventricle has no regional wall motion abnormalities. Left ventricular diastolic parameters were normal.  2. Right ventricular systolic function is normal. The right ventricular size is normal. Tricuspid regurgitation signal is inadequate for assessing PA pressure.  3. The mitral valve is normal in structure. Trivial mitral valve regurgitation. Mild mitral stenosis. MG 27mmHg at 97 bpm.  4. The aortic valve is tricuspid. Aortic valve regurgitation is mild. No aortic  stenosis is present. FINDINGS  Left Ventricle: Left ventricular ejection fraction, by estimation, is 60 to 65%. The left ventricle has normal function. The left ventricle has no regional wall motion abnormalities. The left ventricular internal cavity size was normal in size. There is  no left ventricular hypertrophy. Left ventricular diastolic parameters were normal. Right Ventricle: The right ventricular size is normal. No increase in right ventricular wall thickness. Right ventricular systolic function is  normal. Tricuspid regurgitation signal is inadequate for assessing PA pressure. Left Atrium: Left atrial size was normal in size. Right Atrium: Right atrial size was normal in size. Pericardium: There is no evidence of pericardial effusion. Mitral Valve: The mitral valve is normal in structure. Trivial mitral valve regurgitation. Mild mitral valve stenosis. Tricuspid Valve: The tricuspid valve is normal in structure. Tricuspid valve regurgitation is trivial. Aortic Valve: The aortic valve is tricuspid. Aortic valve regurgitation is mild. Aortic regurgitation PHT measures 259 msec. No aortic stenosis is present. Pulmonic Valve: The pulmonic valve was not well visualized. Pulmonic valve regurgitation is not visualized. Aorta: The aortic root and ascending aorta are structurally normal, with no evidence of dilitation. IAS/Shunts: The interatrial septum was not well visualized.  LEFT VENTRICLE PLAX 2D LVIDd:         5.70 cm  Diastology LVIDs:         4.30 cm  LV e' medial:    6.96 cm/s LV PW:         1.50 cm  LV E/e' medial:  18.1 LV IVS:        1.20 cm  LV e' lateral:   10.40 cm/s LVOT diam:     2.10 cm  LV E/e' lateral: 12.1 LV SV:         55 LV SV Index:   24 LVOT Area:     3.46 cm  RIGHT VENTRICLE             IVC RV Basal diam:  2.90 cm     IVC diam: 1.70 cm RV S prime:     13.40 cm/s TAPSE (M-mode): 1.9 cm LEFT ATRIUM             Index       RIGHT ATRIUM           Index LA diam:        5.30 cm 2.36 cm/m  RA  Area:     14.20 cm LA Vol (A2C):   59.6 ml 26.49 ml/m RA Volume:   35.70 ml  15.87 ml/m LA Vol (A4C):   70.0 ml 31.11 ml/m LA Biplane Vol: 68.3 ml 30.35 ml/m  AORTIC VALVE LVOT Vmax:   93.20 cm/s LVOT Vmean:  63.600 cm/s LVOT VTI:    0.159 m AI PHT:      259 msec  AORTA Ao Root diam: 3.90 cm Ao Asc diam:  3.50 cm MITRAL VALVE MV Area (PHT): 6.96 cm     SHUNTS MV Decel Time: 109 msec     Systemic VTI:  0.16 m MV E velocity: 126.00 cm/s  Systemic Diam: 2.10 cm MV A velocity: 73.50 cm/s MV E/A ratio:  1.71 Oswaldo Milian MD Electronically signed by Oswaldo Milian MD Signature Date/Time: 06/18/2021/12:26:35 PM    Final    DG Chest 2 View  Final Result      Scheduled Meds:  enoxaparin (LOVENOX) injection  40 mg Subcutaneous [id number]   folic acid  1 mg Oral Daily   furosemide  40 mg Intravenous BID   insulin aspart  0-15 Units Subcutaneous TID WC   multivitamin with minerals  1 tablet Oral Daily   sodium chloride flush  3 mL Intravenous Q12H   thiamine  100 mg Oral Daily   Or   thiamine  100 mg Intravenous Daily   PRN Meds: sodium chloride, acetaminophen, LORazepam **OR** LORazepam, ondansetron (ZOFRAN) IV, sodium chloride flush Continuous Infusions:  sodium chloride  LOS: 0 days  Time spent: Greater than 50% of the 35 minute visit was spent in counseling/coordination of care for the patient as laid out in the A&P.   Dwyane Dee, MD [facility] 06/18/2021, 1:44 PM

## 2021-06-18 NOTE — Progress Notes (Signed)
Pt was found by the nurse tech crawling on his hands and knees in his room. Pt states that he did not fall and that he was on the floor looking for something. Pt able to take self back to bed. No obvious injuries noted or any complaints of pain. VS are stable and the pt is in no acute distress. Charge nurse notified. Will continue to monitor.

## 2021-06-18 NOTE — Plan of Care (Signed)
  Problem: Activity: Goal: Capacity to carry out activities will improve Outcome: Completed/Met
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
## 2021-06-18 NOTE — Assessment & Plan Note (Addendum)
-   Initial concern was for new onset CHF however echo obtained on admission and is essentially unremarkable.  EF 60 to 65%, normal diastology. -Given some vascular congestion on CXR, there does appear to be some component of some volume overload as well as some lower extremity edema on exam -Other considered differential would be underlying OSA/OHS vs COPD and/or some flaring although lung exam does not sound too consistent, but he was mildly hypoxic -For now, continue on diuresis with Lasix.  Hold off on steroids for any COPD component -Monitor urine output and wean oxygen off as able - needs an outpatient sleep study after discharge

## 2021-06-18 NOTE — Assessment & Plan Note (Signed)
-   Will start on blood pressure medications if needed

## 2021-06-18 NOTE — Assessment & Plan Note (Signed)
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

## 2021-06-18 NOTE — Assessment & Plan Note (Addendum)
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
## 2021-06-19 DIAGNOSIS — Z7289 Other problems related to lifestyle: Secondary | ICD-10-CM

## 2021-06-19 LAB — BASIC METABOLIC PANEL
Anion gap: 9 (ref 5–15)
BUN: 11 mg/dL (ref 6–20)
CO2: 34 mmol/L — ABNORMAL HIGH (ref 22–32)
Calcium: 8.1 mg/dL — ABNORMAL LOW (ref 8.9–10.3)
Chloride: 94 mmol/L — ABNORMAL LOW (ref 98–111)
Creatinine, Ser: 0.64 mg/dL (ref 0.61–1.24)
GFR, Estimated: >60 mL/min (ref >60)
Glucose, Bld: 112 mg/dL — ABNORMAL HIGH (ref 70–99)
Potassium: 3.3 mmol/L — ABNORMAL LOW (ref 3.5–5.1)
Sodium: 137 mmol/L (ref 135–145)

## 2021-06-19 LAB — MAGNESIUM: Magnesium: 1.2 mg/dL — ABNORMAL LOW (ref 1.7–2.4)

## 2021-06-19 LAB — CBC WITH DIFFERENTIAL/PLATELET
Abs Immature Granulocytes: 0.06 10*3/uL (ref 0.00–0.07)
Basophils Absolute: 0.1 10*3/uL (ref 0.0–0.1)
Basophils Relative: 1 %
Eosinophils Absolute: 0 10*3/uL (ref 0.0–0.5)
Eosinophils Relative: 1 %
HCT: 41.7 % (ref 39.0–52.0)
Hemoglobin: 12.8 g/dL — ABNORMAL LOW (ref 13.0–17.0)
Immature Granulocytes: 1 %
Lymphocytes Relative: 33 %
Lymphs Abs: 2.6 10*3/uL (ref 0.7–4.0)
MCH: 28.4 pg (ref 26.0–34.0)
MCHC: 30.7 g/dL (ref 30.0–36.0)
MCV: 92.7 fL (ref 80.0–100.0)
Monocytes Absolute: 0.9 10*3/uL (ref 0.1–1.0)
Monocytes Relative: 11 %
Neutro Abs: 4.3 10*3/uL (ref 1.7–7.7)
Neutrophils Relative %: 53 %
Platelets: 286 10*3/uL (ref 150–400)
RBC: 4.5 MIL/uL (ref 4.22–5.81)
RDW: 16.1 % — ABNORMAL HIGH (ref 11.5–15.5)
WBC: 7.9 10*3/uL (ref 4.0–10.5)
nRBC: 0.4 % — ABNORMAL HIGH (ref 0.0–0.2)

## 2021-06-19 LAB — GLUCOSE, CAPILLARY
Glucose-Capillary: 145 mg/dL — ABNORMAL HIGH (ref 70–99)
Glucose-Capillary: 223 mg/dL — ABNORMAL HIGH (ref 70–99)
Glucose-Capillary: 143 mg/dL — ABNORMAL HIGH (ref 70–99)
Glucose-Capillary: 195 mg/dL — ABNORMAL HIGH (ref 70–99)

## 2021-06-19 MED ORDER — LORAZEPAM 0.5 MG PO TABS
0.5000 mg | ORAL_TABLET | Freq: Two times a day (BID) | ORAL | Status: DC | PRN
  Administered 2021-06-19 – 2021-06-21 (×3): 0.5 mg via ORAL
  Filled 2021-06-19 (×3): qty 1

## 2021-06-19 MED ORDER — ORAL CARE MOUTH RINSE
15.0000 mL | Freq: Two times a day (BID) | OROMUCOSAL | Status: DC
  Administered 2021-06-19 – 2021-06-20 (×3): 15 mL via OROMUCOSAL

## 2021-06-19 MED ORDER — BUPRENORPHINE HCL-NALOXONE HCL 2-0.5 MG SL SUBL
1.0000 | SUBLINGUAL_TABLET | Freq: Two times a day (BID) | SUBLINGUAL | Status: DC
  Administered 2021-06-19 – 2021-06-21 (×5): 1 via SUBLINGUAL
  Filled 2021-06-19 (×5): qty 1

## 2021-06-19 MED ORDER — MAGNESIUM SULFATE 4 GM/100ML IV SOLN
4.0000 g | Freq: Once | INTRAVENOUS | Status: AC
  Administered 2021-06-19: 4 g via INTRAVENOUS
  Filled 2021-06-19: qty 100

## 2021-06-19 MED ORDER — POTASSIUM CHLORIDE CRYS ER 20 MEQ PO TBCR
40.0000 meq | EXTENDED_RELEASE_TABLET | ORAL | Status: AC
  Administered 2021-06-19 (×2): 40 meq via ORAL
  Filled 2021-06-19 (×2): qty 2

## 2021-06-19 NOTE — Plan of Care (Signed)
  Problem: Education: Goal: Ability to demonstrate management of disease process will improve Outcome: Progressing   Problem: Education: Goal: Ability to verbalize understanding of medication therapies will improve Outcome: Progressing   Problem: Safety: Goal: Ability to remain free from injury will improve Outcome: Progressing   

## 2021-06-19 NOTE — Progress Notes (Signed)
Progress Note    Jacob [name]   RKY:[id number]  DOB: 03/08/1966  DOA: 06/17/2021     1  PCP: [name], MD  CC: SOB  Hospital Course: Jacob [name] is a 55 y.o. male with medical history significant of HTN, DMII, HLD, prior cocaine and opiate abuse. Ongoing alcohol use.  He presented to the hospital with worsening shortness of breath that has been slowly progressing for the past few weeks prior to admission.  He endorsed dyspnea on exertion, orthopnea, and worsening lower extremity edema. He admits to ongoing alcohol use with approximately half a pint of gin daily.  States that he has not done cocaine in approximately 9 months but has a history of severe cocaine use. He also continues to smoke cigarettes, approximately 0.5 PPD for approximately 40 years.  CXR on admission showed questionable pulmonary edema and vascular congestion.  BNP was elevated, 532.  Troponins were indeterminately elevated in the mid 20s.  He was mildly hypoxic, 87% on room air and placed on oxygen on admission. He was admitted for presumed new diagnosis CHF and started on IV Lasix and echo was ordered. His echo showed normal EF and no DD.   Interval History:  No events overnight.  Has been diuresing well on IV Lasix.  States that his breathing is improving also. Still has some ongoing anxiety and mild probable withdrawal symptoms.  ROS: Constitutional: negative for chills and fevers, Respiratory: positive for cough and dyspnea on exertion, negative for hemoptysis and wheezing, Cardiovascular: negative for chest pain, and Gastrointestinal: negative for abdominal pain  Assessment & Plan: * SOB (shortness of breath) - Initial concern was for new onset CHF however echo obtained on admission and is essentially unremarkable.  EF 60 to 65%, normal diastology. -Given some vascular congestion on CXR, there does appear to be some component of some volume overload as well as some lower extremity edema  on exam -Other considered differential would be underlying OSA/OHS vs COPD and/or some flaring although lung exam does not sound too consistent, but he was mildly hypoxic -For now, continue on diuresis with Lasix.  Hold off on steroids for any COPD component -Monitor urine output and wean oxygen off as able - needs an outpatient sleep study after discharge   Acute respiratory failure with hypoxia (HCC) - Differential includes volume overload with vascular congestion on CXR versus component of undiagnosed COPD given approximately 20-pack-year smoking history - Wean oxygen off as able - Continuing diuresis for now  Alcohol use - Patient endorses drinking approximately 1/2 pint of gin daily -Has not progressed with any further withdrawal symptoms - Discontinue CIWA protocol - Continue on some Ativan as needed for anxiety  DMII (diabetes mellitus, type 2) (HCC) - A1c 8% on admission - Continue SSI and CBG monitoring -Follow-up med rec but looks to be on metformin at home already  History of cocaine abuse Jacob [name]) - Patient states he has been abstinent for approximately 9 months - UDS negative on admission as well  Tobacco use - Approximately 20-pack-year smoking history (0.5 PPD x40 years) - Patient wants to use his Nicotrol inhaler, if so will need to be checked into pharmacy to then dispense otherwise he may have a nicotine patch  Essential hypertension - Will start on blood pressure medications if needed   Old records reviewed in assessment of this patient  Antimicrobials:   DVT prophylaxis: enoxaparin (LOVENOX) injection 40 mg Start: 06/18/21 1000   Code Status:   Code Status: Full  Code Family Communication:   Disposition Plan: Status is: Inpatient  Remains inpatient appropriate because:Ongoing diagnostic testing needed not appropriate for outpatient work up, IV treatments appropriate due to intensity of illness or inability to take PO, and Inpatient level of care  appropriate due to severity of illness  Dispo: The patient is from: Home              Anticipated d/c is to: Home              Patient currently is not medically stable to d/c.   Difficult to place patient No  Risk of unplanned readmission score: Unplanned Admission- Pilot do not use: 24.31   Objective: Blood pressure (!) 142/88, pulse (!) 106, temperature 98.7 F (37.1 C), resp. rate 20, height 6\' 1"  (1.854 m), weight 96.6 kg, SpO2 (!) 85 %.  Examination: General appearance: alert, cooperative, no distress, and impulsive Head: Normocephalic, without obvious abnormality, atraumatic Eyes:  EOMI Lungs:  No significant wheezing.  Mild scattered crackles Heart: regular rate and rhythm and S1, S2 normal Abdomen:  Obese, soft, nontender, nondistended, bowel sounds distant but present Extremities:  Trace to 1+ lower extremity bilateral pitting edema Skin: mobility and turgor normal Neurologic: Grossly normal  Consultants:    Procedures:    Data Reviewed: I have personally reviewed following labs and imaging studies Results for orders placed or performed during the hospital encounter of 06/17/21 (from the past 24 hour(s))  Glucose, capillary     Status: None   Collection Time: 06/18/21  4:44 PM  Result Value Ref Range   Glucose-Capillary 92 70 - 99 mg/dL  Glucose, capillary     Status: Abnormal   Collection Time: 06/18/21  8:41 PM  Result Value Ref Range   Glucose-Capillary 153 (H) 70 - 99 mg/dL  Basic metabolic panel     Status: Abnormal   Collection Time: 06/19/21  3:30 AM  Result Value Ref Range   Sodium 137 135 - 145 mmol/L   Potassium 3.3 (L) 3.5 - 5.1 mmol/L   Chloride 94 (L) 98 - 111 mmol/L   CO2 34 (H) 22 - 32 mmol/L   Glucose, Bld 112 (H) 70 - 99 mg/dL   BUN 11 6 - 20 mg/dL   Creatinine, Ser 0.64 0.61 - 1.24 mg/dL   Calcium 8.1 (L) 8.9 - 10.3 mg/dL   GFR, Estimated >60 >60 mL/min   Anion gap 9 5 - 15  CBC with Differential/Platelet     Status: Abnormal    Collection Time: 06/19/21  3:30 AM  Result Value Ref Range   WBC 7.9 4.0 - 10.5 K/uL   RBC 4.50 4.22 - 5.81 MIL/uL   Hemoglobin 12.8 (L) 13.0 - 17.0 g/dL   HCT 41.7 39.0 - 52.0 %   MCV 92.7 80.0 - 100.0 fL   MCH 28.4 26.0 - 34.0 pg   MCHC 30.7 30.0 - 36.0 g/dL   RDW 16.1 (H) 11.5 - 15.5 %   Platelets 286 150 - 400 K/uL   nRBC 0.4 (H) 0.0 - 0.2 %   Neutrophils Relative % 53 %   Neutro Abs 4.3 1.7 - 7.7 K/uL   Lymphocytes Relative 33 %   Lymphs Abs 2.6 0.7 - 4.0 K/uL   Monocytes Relative 11 %   Monocytes Absolute 0.9 0.1 - 1.0 K/uL   Eosinophils Relative 1 %   Eosinophils Absolute 0.0 0.0 - 0.5 K/uL   Basophils Relative 1 %   Basophils Absolute 0.1 0.0 - 0.1  K/uL   Immature Granulocytes 1 %   Abs Immature Granulocytes 0.06 0.00 - 0.07 K/uL  Magnesium     Status: Abnormal   Collection Time: 06/19/21  3:30 AM  Result Value Ref Range   Magnesium 1.2 (L) 1.7 - 2.4 mg/dL  Glucose, capillary     Status: Abnormal   Collection Time: 06/19/21  7:51 AM  Result Value Ref Range   Glucose-Capillary 145 (H) 70 - 99 mg/dL   Comment 1 Notify RN   Glucose, capillary     Status: Abnormal   Collection Time: 06/19/21 12:17 PM  Result Value Ref Range   Glucose-Capillary 223 (H) 70 - 99 mg/dL    Recent Results (from the past 240 hour(s))  Resp Panel by RT-PCR (Flu A&B, Covid) Nasopharyngeal Swab     Status: None   Collection Time: 06/17/21 11:57 PM   Specimen: Nasopharyngeal Swab; Nasopharyngeal(NP) swabs in vial transport medium  Result Value Ref Range Status   SARS Coronavirus 2 by RT PCR NEGATIVE NEGATIVE Final    Comment: (NOTE) SARS-CoV-2 target nucleic acids are NOT DETECTED.  The SARS-CoV-2 RNA is generally detectable in upper respiratory specimens during the acute phase of infection. The lowest concentration of SARS-CoV-2 viral copies this assay can detect is 138 copies/mL. A negative result does not preclude SARS-Cov-2 infection and should not be used as the sole basis for  treatment or other patient management decisions. A negative result may occur with  improper specimen collection/handling, submission of specimen other than nasopharyngeal swab, presence of viral mutation(s) within the areas targeted by this assay, and inadequate number of viral copies(<138 copies/mL). A negative result must be combined with clinical observations, patient history, and epidemiological information. The expected result is Negative.  Fact Sheet for Patients:  EntrepreneurPulse.com.au  Fact Sheet for [name] Providers:  IncredibleEmployment.be  This test is no t yet approved or cleared by the [place] FDA and  has been authorized for detection and/or diagnosis of SARS-CoV-2 by FDA under an Emergency Use Authorization (EUA). This EUA will remain  in effect (meaning this test can be used) for the duration of the COVID-19 declaration under Section 564(b)(1) of the Act, 21 U.S.C.section 360bbb-3(b)(1), unless the authorization is terminated  or revoked sooner.       Influenza A by PCR NEGATIVE NEGATIVE Final   Influenza B by PCR NEGATIVE NEGATIVE Final    Comment: (NOTE) The Xpert Xpress SARS-CoV-2/FLU/RSV plus assay is intended as an aid in the diagnosis of influenza from Nasopharyngeal swab specimens and should not be used as a sole basis for treatment. Nasal washings and aspirates are unacceptable for Xpert Xpress SARS-CoV-2/FLU/RSV testing.  Fact Sheet for Patients: EntrepreneurPulse.com.au  Fact Sheet for [name] Providers: IncredibleEmployment.be  This test is not yet approved or cleared by the [place] FDA and has been authorized for detection and/or diagnosis of SARS-CoV-2 by FDA under an Emergency Use Authorization (EUA). This EUA will remain in effect (meaning this test can be used) for the duration of the COVID-19 declaration under Section 564(b)(1) of the Act, 21  U.S.C. section 360bbb-3(b)(1), unless the authorization is terminated or revoked.  Performed at [facility], [place] [address], [place], [place] [address]      Radiology Studies: DG Chest 2 View  Result Date: 06/17/2021 CLINICAL DATA:  Hypoxia, confusion, chest pain and altered mental status EXAM: CHEST - 2 VIEW COMPARISON:  Radiograph 08/31/2020 FINDINGS: Some hazy interstitial opacities are present in the mid to lower lungs. Slight pulmonary  vascular congestion. No pneumothorax or visible effusion. Cardiomegaly with otherwise unremarkable cardiomediastinal contours. No acute osseous or soft tissue abnormality. IMPRESSION: Hazy opacities in the mid to lower lungs, could reflect atelectasis versus developing edema given pulmonary vascular congestion and cardiomegaly. Infection less favored though not fully excluded in the appropriate clinical context. Electronically Signed   By: Lovena Le M.D.   On: 06/17/2021 22:12   ECHOCARDIOGRAM COMPLETE  Result Date: 06/18/2021    ECHOCARDIOGRAM REPORT   Patient Name:   Jacob [name] Date of Exam: 06/18/2021 Medical Rec #:  [id number]           Height:       73.0 in Accession #:    [id number]          Weight:       222.2 lb Date of Birth:  1966-09-11           BSA:          2.250 m Patient Age:    46 years            BP:           105/57 mmHg Patient Gender: M                   HR:           97 bpm. Exam Location:  Inpatient Procedure: 2D Echo, Cardiac Doppler and Color Doppler Indications:    I50.33 Acute on chronic diastolic (congestive) heart failure  History:        Patient has prior history of Echocardiogram examinations, most                 recent 07/17/2016. Stroke; Risk Factors:Hypertension, Diabetes                 and Dyslipidemia. Opioid abuse.  Sonographer:    Jonelle Sidle Dance Referring Phys: [place]  1. Left ventricular ejection fraction, by estimation, is 60 to 65%. The left ventricle has normal  function. The left ventricle has no regional wall motion abnormalities. Left ventricular diastolic parameters were normal.  2. Right ventricular systolic function is normal. The right ventricular size is normal. Tricuspid regurgitation signal is inadequate for assessing PA pressure.  3. The mitral valve is normal in structure. Trivial mitral valve regurgitation. Mild mitral stenosis. MG 71mmHg at 97 bpm.  4. The aortic valve is tricuspid. Aortic valve regurgitation is mild. No aortic stenosis is present. FINDINGS  Left Ventricle: Left ventricular ejection fraction, by estimation, is 60 to 65%. The left ventricle has normal function. The left ventricle has no regional wall motion abnormalities. The left ventricular internal cavity size was normal in size. There is  no left ventricular hypertrophy. Left ventricular diastolic parameters were normal. Right Ventricle: The right ventricular size is normal. No increase in right ventricular wall thickness. Right ventricular systolic function is normal. Tricuspid regurgitation signal is inadequate for assessing PA pressure. Left Atrium: Left atrial size was normal in size. Right Atrium: Right atrial size was normal in size. Pericardium: There is no evidence of pericardial effusion. Mitral Valve: The mitral valve is normal in structure. Trivial mitral valve regurgitation. Mild mitral valve stenosis. Tricuspid Valve: The tricuspid valve is normal in structure. Tricuspid valve regurgitation is trivial. Aortic Valve: The aortic valve is tricuspid. Aortic valve regurgitation is mild. Aortic regurgitation PHT measures 259 msec. No aortic stenosis is present. Pulmonic Valve: The pulmonic valve was not well visualized. Pulmonic valve regurgitation is  not visualized. Aorta: The aortic root and ascending aorta are structurally normal, with no evidence of dilitation. IAS/Shunts: The interatrial septum was not well visualized.  LEFT VENTRICLE PLAX 2D LVIDd:         5.70 cm  Diastology  LVIDs:         4.30 cm  LV e' medial:    6.96 cm/s LV PW:         1.50 cm  LV E/e' medial:  18.1 LV IVS:        1.20 cm  LV e' lateral:   10.40 cm/s LVOT diam:     2.10 cm  LV E/e' lateral: 12.1 LV SV:         55 LV SV Index:   24 LVOT Area:     3.46 cm  RIGHT VENTRICLE             IVC RV Basal diam:  2.90 cm     IVC diam: 1.70 cm RV S prime:     13.40 cm/s TAPSE (M-mode): 1.9 cm LEFT ATRIUM             Index       RIGHT ATRIUM           Index LA diam:        5.30 cm 2.36 cm/m  RA Area:     14.20 cm LA Vol (A2C):   59.6 ml 26.49 ml/m RA Volume:   35.70 ml  15.87 ml/m LA Vol (A4C):   70.0 ml 31.11 ml/m LA Biplane Vol: 68.3 ml 30.35 ml/m  AORTIC VALVE LVOT Vmax:   93.20 cm/s LVOT Vmean:  63.600 cm/s LVOT VTI:    0.159 m AI PHT:      259 msec  AORTA Ao Root diam: 3.90 cm Ao Asc diam:  3.50 cm MITRAL VALVE MV Area (PHT): 6.96 cm     SHUNTS MV Decel Time: 109 msec     Systemic VTI:  0.16 m MV E velocity: 126.00 cm/s  Systemic Diam: 2.10 cm MV A velocity: 73.50 cm/s MV E/A ratio:  1.71 Oswaldo Milian MD Electronically signed by Oswaldo Milian MD Signature Date/Time: 06/18/2021/12:26:35 PM    Final    DG Chest 2 View  Final Result      Scheduled Meds:  enoxaparin (LOVENOX) injection  40 mg Subcutaneous [id number]   folic acid  1 mg Oral Daily   furosemide  40 mg Intravenous BID   insulin aspart  0-15 Units Subcutaneous TID WC   mouth rinse  15 mL Mouth Rinse BID   multivitamin with minerals  1 tablet Oral Daily   sodium chloride flush  3 mL Intravenous Q12H   thiamine  100 mg Oral Daily   Or   thiamine  100 mg Intravenous Daily   PRN Meds: sodium chloride, acetaminophen, LORazepam, ondansetron (ZOFRAN) IV, sodium chloride flush Continuous Infusions:  sodium chloride       LOS: 1 day  Time spent: Greater than 50% of the 35 minute visit was spent in counseling/coordination of care for the patient as laid out in the A&P.   Dwyane Dee, MD [facility] 06/19/2021, 1:36 PM

## 2021-06-20 LAB — GLUCOSE, CAPILLARY
Glucose-Capillary: 130 mg/dL — ABNORMAL HIGH (ref 70–99)
Glucose-Capillary: 190 mg/dL — ABNORMAL HIGH (ref 70–99)
Glucose-Capillary: 230 mg/dL — ABNORMAL HIGH (ref 70–99)
Glucose-Capillary: 194 mg/dL — ABNORMAL HIGH (ref 70–99)

## 2021-06-20 LAB — CBC WITH DIFFERENTIAL/PLATELET
Abs Immature Granulocytes: 0.07 10*3/uL (ref 0.00–0.07)
Basophils Absolute: 0.1 10*3/uL (ref 0.0–0.1)
Basophils Relative: 1 %
Eosinophils Absolute: 0 10*3/uL (ref 0.0–0.5)
Eosinophils Relative: 1 %
HCT: 42.6 % (ref 39.0–52.0)
Hemoglobin: 13 g/dL (ref 13.0–17.0)
Immature Granulocytes: 1 %
Lymphocytes Relative: 35 %
Lymphs Abs: 3.1 10*3/uL (ref 0.7–4.0)
MCH: 28.3 pg (ref 26.0–34.0)
MCHC: 30.5 g/dL (ref 30.0–36.0)
MCV: 92.8 fL (ref 80.0–100.0)
Monocytes Absolute: 0.8 10*3/uL (ref 0.1–1.0)
Monocytes Relative: 9 %
Neutro Abs: 4.8 10*3/uL (ref 1.7–7.7)
Neutrophils Relative %: 53 %
Platelets: 274 10*3/uL (ref 150–400)
RBC: 4.59 MIL/uL (ref 4.22–5.81)
RDW: 16.2 % — ABNORMAL HIGH (ref 11.5–15.5)
WBC: 8.9 10*3/uL (ref 4.0–10.5)
nRBC: 0 % (ref 0.0–0.2)

## 2021-06-20 LAB — BASIC METABOLIC PANEL
Anion gap: 8 (ref 5–15)
BUN: 13 mg/dL (ref 6–20)
CO2: 35 mmol/L — ABNORMAL HIGH (ref 22–32)
Calcium: 8.4 mg/dL — ABNORMAL LOW (ref 8.9–10.3)
Chloride: 93 mmol/L — ABNORMAL LOW (ref 98–111)
Creatinine, Ser: 0.84 mg/dL (ref 0.61–1.24)
GFR, Estimated: >60 mL/min (ref >60)
Glucose, Bld: 143 mg/dL — ABNORMAL HIGH (ref 70–99)
Potassium: 3.7 mmol/L (ref 3.5–5.1)
Sodium: 136 mmol/L (ref 135–145)

## 2021-06-20 LAB — MAGNESIUM: Magnesium: 1.8 mg/dL (ref 1.7–2.4)

## 2021-06-20 NOTE — Plan of Care (Signed)
  Problem: Education: Goal: Knowledge of disease or condition will improve Outcome: Progressing Goal: Understanding of discharge needs will improve Outcome: Progressing   Problem: Health Behavior/Discharge Planning: Goal: Ability to identify changes in lifestyle to reduce recurrence of condition will improve Outcome: Progressing   Problem: Clinical Measurements: Goal: Respiratory complications will improve Outcome: Progressing Goal: Cardiovascular complication will be avoided Outcome: Progressing

## 2021-06-20 NOTE — Progress Notes (Deleted)
SATURATION QUALIFICATIONS: (This note is used to comply with regulatory documentation for home oxygen)  Patient Saturations on Room Air at Rest = 94%  Patient Saturations on Room Air while Ambulating = 88%  Patient Saturations on 0 Liters of oxygen while Ambulating = 0%  Please briefly explain why patient needs home oxygen:  Will re-evaluate pt with O2 after resting. MD notified

## 2021-06-20 NOTE — Progress Notes (Signed)
Progress Note    Jacob [name]   RWE:[id number]  DOB: 08-20-1966  DOA: 06/17/2021     2  PCP: [name], MD  CC: SOB  Hospital Course: Jacob [name] is a 55 y.o. male with medical history significant of HTN, DMII, HLD, prior cocaine and opiate abuse. Ongoing alcohol use.  He presented to the hospital with worsening shortness of breath that has been slowly progressing for the past few weeks prior to admission.  He endorsed dyspnea on exertion, orthopnea, and worsening lower extremity edema. He admits to ongoing alcohol use with approximately half a pint of gin daily.  States that he has not done cocaine in approximately 9 months but has a history of severe cocaine use. He also continues to smoke cigarettes, approximately 0.5 PPD for approximately 40 years.  CXR on admission showed questionable pulmonary edema and vascular congestion.  BNP was elevated, 532.  Troponins were indeterminately elevated in the mid 20s.  He was mildly hypoxic, 87% on room air and placed on oxygen on admission. He was admitted for presumed new diagnosis CHF and started on IV Lasix and echo was ordered. His echo showed normal EF and no DD.   Interval History:  No events overnight.  Ambulated in the hall on RA and became rather fatigued/dyspneic with sats down to 88%.   ROS: Constitutional: negative for chills and fevers, Respiratory: positive for cough and dyspnea on exertion, negative for hemoptysis and wheezing, Cardiovascular: negative for chest pain, and Gastrointestinal: negative for abdominal pain  Assessment & Plan: * SOB (shortness of breath) - Initial concern was for new onset CHF however echo obtained on admission and is essentially unremarkable.  EF 60 to 65%, normal diastology. -Given some vascular congestion on CXR, there does appear to be some component of some volume overload as well as some lower extremity edema on exam -Other considered differential would be underlying  OSA/OHS vs COPD and/or some flaring although lung exam does not sound too consistent, but he was mildly hypoxic -For now, continue on diuresis with Lasix.  Hold off on steroids for any COPD component -Monitor urine output and wean oxygen off as able - needs an outpatient sleep study after discharge   Acute respiratory failure with hypoxia (HCC) - Differential includes volume overload with vascular congestion on CXR versus component of undiagnosed COPD given approximately 20-pack-year smoking history - Wean oxygen off as able - Continuing diuresis for now  Alcohol use - Patient endorses drinking approximately 1/2 pint of gin daily -Has not progressed with any further withdrawal symptoms - Discontinue CIWA protocol - Continue on some Ativan as needed for anxiety  DMII (diabetes mellitus, type 2) (HCC) - A1c 8% on admission - Continue SSI and CBG monitoring -Follow-up med rec but looks to be on metformin at home already  History of cocaine abuse [facility]) - Patient states he has been abstinent for approximately 9 months - UDS negative on admission as well  Tobacco use - Approximately 20-pack-year smoking history (0.5 PPD x40 years) - Patient wants to use his Nicotrol inhaler, if so will need to be checked into pharmacy to then dispense otherwise he may have a nicotine patch  Essential hypertension - Will start on blood pressure medications if needed   Old records reviewed in assessment of this patient  Antimicrobials:   DVT prophylaxis: enoxaparin (LOVENOX) injection 40 mg Start: 06/18/21 1000   Code Status:   Code Status: Full Code Family Communication:   Disposition Plan: Status is:  Inpatient  Remains inpatient appropriate because:Ongoing diagnostic testing needed not appropriate for outpatient work up, IV treatments appropriate due to intensity of illness or inability to take PO, and Inpatient level of care appropriate due to severity of illness  Dispo: The patient is  from: Home              Anticipated d/c is to: Home              Patient currently is not medically stable to d/c. Tentative for Tuesday   Difficult to place patient No  Risk of unplanned readmission score: Unplanned Admission- Pilot do not use: 22.67   Objective: Blood pressure 128/77, pulse (!) 101, temperature 99 F (37.2 C), temperature source Oral, resp. rate 18, height 6\' 1"  (1.854 m), weight 95.8 kg, SpO2 (!) 80 %.  Examination: General appearance: alert, cooperative, no distress, and impulsive Head: Normocephalic, without obvious abnormality, atraumatic Eyes:  EOMI Lungs:  No significant wheezing.  Mild scattered crackles Heart: regular rate and rhythm and S1, S2 normal Abdomen:  Obese, soft, nontender, nondistended, bowel sounds distant but present Extremities:  no further LLE edema; trace chronic RLE edema from history of leg surgery Skin: mobility and turgor normal Neurologic: Grossly normal  Consultants:    Procedures:    Data Reviewed: I have personally reviewed following labs and imaging studies Results for orders placed or performed during the hospital encounter of 06/17/21 (from the past 24 hour(s))  Glucose, capillary     Status: Abnormal   Collection Time: 06/19/21  4:25 PM  Result Value Ref Range   Glucose-Capillary 143 (H) 70 - 99 mg/dL  Glucose, capillary     Status: Abnormal   Collection Time: 06/19/21  9:07 PM  Result Value Ref Range   Glucose-Capillary 195 (H) 70 - 99 mg/dL  Basic metabolic panel     Status: Abnormal   Collection Time: 06/20/21  3:19 AM  Result Value Ref Range   Sodium 136 135 - 145 mmol/L   Potassium 3.7 3.5 - 5.1 mmol/L   Chloride 93 (L) 98 - 111 mmol/L   CO2 35 (H) 22 - 32 mmol/L   Glucose, Bld 143 (H) 70 - 99 mg/dL   BUN 13 6 - 20 mg/dL   Creatinine, Ser 0.84 0.61 - 1.24 mg/dL   Calcium 8.4 (L) 8.9 - 10.3 mg/dL   GFR, Estimated >60 >60 mL/min   Anion gap 8 5 - 15  CBC with Differential/Platelet     Status: Abnormal    Collection Time: 06/20/21  3:19 AM  Result Value Ref Range   WBC 8.9 4.0 - 10.5 K/uL   RBC 4.59 4.22 - 5.81 MIL/uL   Hemoglobin 13.0 13.0 - 17.0 g/dL   HCT 42.6 39.0 - 52.0 %   MCV 92.8 80.0 - 100.0 fL   MCH 28.3 26.0 - 34.0 pg   MCHC 30.5 30.0 - 36.0 g/dL   RDW 16.2 (H) 11.5 - 15.5 %   Platelets 274 150 - 400 K/uL   nRBC 0.0 0.0 - 0.2 %   Neutrophils Relative % 53 %   Neutro Abs 4.8 1.7 - 7.7 K/uL   Lymphocytes Relative 35 %   Lymphs Abs 3.1 0.7 - 4.0 K/uL   Monocytes Relative 9 %   Monocytes Absolute 0.8 0.1 - 1.0 K/uL   Eosinophils Relative 1 %   Eosinophils Absolute 0.0 0.0 - 0.5 K/uL   Basophils Relative 1 %   Basophils Absolute 0.1 0.0 - 0.1 K/uL  Immature Granulocytes 1 %   Abs Immature Granulocytes 0.07 0.00 - 0.07 K/uL  Magnesium     Status: None   Collection Time: 06/20/21  3:19 AM  Result Value Ref Range   Magnesium 1.8 1.7 - 2.4 mg/dL  Glucose, capillary     Status: Abnormal   Collection Time: 06/20/21  8:04 AM  Result Value Ref Range   Glucose-Capillary 130 (H) 70 - 99 mg/dL  Glucose, capillary     Status: Abnormal   Collection Time: 06/20/21 11:45 AM  Result Value Ref Range   Glucose-Capillary 190 (H) 70 - 99 mg/dL    Recent Results (from the past 240 hour(s))  Resp Panel by RT-PCR (Flu A&B, Covid) Nasopharyngeal Swab     Status: None   Collection Time: 06/17/21 11:57 PM   Specimen: Nasopharyngeal Swab; Nasopharyngeal(NP) swabs in vial transport medium  Result Value Ref Range Status   SARS Coronavirus 2 by RT PCR NEGATIVE NEGATIVE Final    Comment: (NOTE) SARS-CoV-2 target nucleic acids are NOT DETECTED.  The SARS-CoV-2 RNA is generally detectable in upper respiratory specimens during the acute phase of infection. The lowest concentration of SARS-CoV-2 viral copies this assay can detect is 138 copies/mL. A negative result does not preclude SARS-Cov-2 infection and should not be used as the sole basis for treatment or other patient management  decisions. A negative result may occur with  improper specimen collection/handling, submission of specimen other than nasopharyngeal swab, presence of viral mutation(s) within the areas targeted by this assay, and inadequate number of viral copies(<138 copies/mL). A negative result must be combined with clinical observations, patient history, and epidemiological information. The expected result is Negative.  Fact Sheet for Patients:  EntrepreneurPulse.com.au  Fact Sheet for Healthcare Providers:  IncredibleEmployment.be  This test is no t yet approved or cleared by the [place] FDA and  has been authorized for detection and/or diagnosis of SARS-CoV-2 by FDA under an Emergency Use Authorization (EUA). This EUA will remain  in effect (meaning this test can be used) for the duration of the COVID-19 declaration under Section 564(b)(1) of the Act, 21 U.S.C.section 360bbb-3(b)(1), unless the authorization is terminated  or revoked sooner.       Influenza A by PCR NEGATIVE NEGATIVE Final   Influenza B by PCR NEGATIVE NEGATIVE Final    Comment: (NOTE) The Xpert Xpress SARS-CoV-2/FLU/RSV plus assay is intended as an aid in the diagnosis of influenza from Nasopharyngeal swab specimens and should not be used as a sole basis for treatment. Nasal washings and aspirates are unacceptable for Xpert Xpress SARS-CoV-2/FLU/RSV testing.  Fact Sheet for Patients: EntrepreneurPulse.com.au  Fact Sheet for Healthcare Providers: IncredibleEmployment.be  This test is not yet approved or cleared by the [place] FDA and has been authorized for detection and/or diagnosis of SARS-CoV-2 by FDA under an Emergency Use Authorization (EUA). This EUA will remain in effect (meaning this test can be used) for the duration of the COVID-19 declaration under Section 564(b)(1) of the Act, 21 U.S.C. section 360bbb-3(b)(1), unless the  authorization is terminated or revoked.  Performed at [facility], [place] [address] [place], [place] [address]      Radiology Studies: No results found. DG Chest 2 View  Final Result      Scheduled Meds:  buprenorphine-naloxone  1 tablet Sublingual BID   enoxaparin (LOVENOX) injection  40 mg Subcutaneous H82X   folic acid  1 mg Oral Daily   furosemide  40 mg Intravenous BID   insulin  aspart  0-15 Units Subcutaneous TID WC   mouth rinse  15 mL Mouth Rinse BID   multivitamin with minerals  1 tablet Oral Daily   sodium chloride flush  3 mL Intravenous Q12H   thiamine  100 mg Oral Daily   Or   thiamine  100 mg Intravenous Daily   PRN Meds: sodium chloride, acetaminophen, LORazepam, ondansetron (ZOFRAN) IV, sodium chloride flush Continuous Infusions:  sodium chloride       LOS: 2 days  Time spent: Greater than 50% of the 35 minute visit was spent in counseling/coordination of care for the patient as laid out in the A&P.   Dwyane Dee, MD [facility] 06/20/2021, 2:47 PM

## 2021-06-20 NOTE — Progress Notes (Signed)
SATURATION QUALIFICATIONS: (This note is used to comply with regulatory documentation for home oxygen)  Patient Saturations on Room Air at Rest = 95%  Patient Saturations on Room Air while Ambulating = 88%  Patient Saturations on 2 Liters of oxygen while Ambulating = 91%  Please briefly explain why patient needs home oxygen:

## 2021-06-21 ENCOUNTER — Inpatient Hospital Stay (HOSPITAL_COMMUNITY): Payer: Medicare Other

## 2021-06-21 ENCOUNTER — Encounter (HOSPITAL_COMMUNITY): Payer: Self-pay | Admitting: Internal Medicine

## 2021-06-21 LAB — BASIC METABOLIC PANEL
Anion gap: 9 (ref 5–15)
BUN: 17 mg/dL (ref 6–20)
CO2: 35 mmol/L — ABNORMAL HIGH (ref 22–32)
Calcium: 8.9 mg/dL (ref 8.9–10.3)
Chloride: 94 mmol/L — ABNORMAL LOW (ref 98–111)
Creatinine, Ser: 0.73 mg/dL (ref 0.61–1.24)
GFR, Estimated: >60 mL/min (ref >60)
Glucose, Bld: 131 mg/dL — ABNORMAL HIGH (ref 70–99)
Potassium: 3.8 mmol/L (ref 3.5–5.1)
Sodium: 138 mmol/L (ref 135–145)

## 2021-06-21 LAB — CBC WITH DIFFERENTIAL/PLATELET
Abs Immature Granulocytes: 0.07 10*3/uL (ref 0.00–0.07)
Basophils Absolute: 0.1 10*3/uL (ref 0.0–0.1)
Basophils Relative: 1 %
Eosinophils Absolute: 0 10*3/uL (ref 0.0–0.5)
Eosinophils Relative: 0 %
HCT: 46.1 % (ref 39.0–52.0)
Hemoglobin: 13.9 g/dL (ref 13.0–17.0)
Immature Granulocytes: 1 %
Lymphocytes Relative: 36 %
Lymphs Abs: 3.3 10*3/uL (ref 0.7–4.0)
MCH: 28.2 pg (ref 26.0–34.0)
MCHC: 30.2 g/dL (ref 30.0–36.0)
MCV: 93.5 fL (ref 80.0–100.0)
Monocytes Absolute: 0.7 10*3/uL (ref 0.1–1.0)
Monocytes Relative: 8 %
Neutro Abs: 5.1 10*3/uL (ref 1.7–7.7)
Neutrophils Relative %: 54 %
Platelets: 288 10*3/uL (ref 150–400)
RBC: 4.93 MIL/uL (ref 4.22–5.81)
RDW: 16.1 % — ABNORMAL HIGH (ref 11.5–15.5)
WBC: 9.3 10*3/uL (ref 4.0–10.5)
nRBC: 0 % (ref 0.0–0.2)

## 2021-06-21 LAB — GLUCOSE, CAPILLARY
Glucose-Capillary: 157 mg/dL — ABNORMAL HIGH (ref 70–99)
Glucose-Capillary: 237 mg/dL — ABNORMAL HIGH (ref 70–99)
Glucose-Capillary: 168 mg/dL — ABNORMAL HIGH (ref 70–99)

## 2021-06-21 LAB — MAGNESIUM: Magnesium: 1.7 mg/dL (ref 1.7–2.4)

## 2021-06-21 MED ORDER — METOPROLOL TARTRATE 50 MG PO TABS
50.0000 mg | ORAL_TABLET | Freq: Two times a day (BID) | ORAL | Status: DC
  Administered 2021-06-21: 50 mg via ORAL
  Filled 2021-06-21: qty 1

## 2021-06-21 MED ORDER — NICOTINE 14 MG/24HR TD PT24: 14.0000 mg | MEDICATED_PATCH | TRANSDERMAL | 0 refills | Status: AC

## 2021-06-21 MED ORDER — FLUTICASONE FUROATE-VILANTEROL 100-25 MCG/INH IN AEPB
1.0000 | INHALATION_SPRAY | Freq: Every day | RESPIRATORY_TRACT | Status: DC
  Filled 2021-06-21: qty 28

## 2021-06-21 MED ORDER — ATORVASTATIN CALCIUM 10 MG PO TABS
10.0000 mg | ORAL_TABLET | Freq: Every day | ORAL | Status: DC
  Administered 2021-06-21: 10 mg via ORAL
  Filled 2021-06-21: qty 1

## 2021-06-21 MED ORDER — THIAMINE HCL 100 MG PO TABS: 100.0000 mg | ORAL_TABLET | Freq: Every day | ORAL | 0 refills | Status: AC

## 2021-06-21 MED ORDER — RISPERIDONE 1 MG PO TABS: 3.0000 mg | ORAL_TABLET | Freq: Every day | ORAL | Status: DC

## 2021-06-21 MED ORDER — LOSARTAN POTASSIUM 25 MG PO TABS
25.0000 mg | ORAL_TABLET | Freq: Every day | ORAL | Status: DC
  Administered 2021-06-21: 25 mg via ORAL
  Filled 2021-06-21: qty 1

## 2021-06-21 MED ORDER — MONTELUKAST SODIUM 10 MG PO TABS: 10.0000 mg | ORAL_TABLET | Freq: Every day | ORAL | Status: DC

## 2021-06-21 NOTE — TOC Transition Note (Signed)
Transition of Care [facility]) - CM/SW Discharge Note   Patient Details  Name: Jacob [name] MRN: [id number] Date of Birth: Apr 06, 1966  Transition [facility]) CM/SW Contact:  Ross Ludwig, LCSW Phone Number: 06/21/2021, 5:15 PM   Clinical Narrative:     CSW was informed that patient will need oxygen.  Patient will be going home with home oxygen through [employer].  CSW signing off please reconsult with any other social work needs, home dme agency has been notified of planned discharge.    Final next level of care: Home/Self Care Barriers to Discharge: Barriers Resolved   Patient Goals and CMS Choice Patient states their goals for this hospitalization and ongoing recovery are:: To return back home with oxygen CMS Medicare.gov Compare Post Acute Care list provided to:: Patient Choice offered to / list presented to : Patient  Discharge Placement                       Discharge Plan and Services     Post Acute Care Choice: Durable Medical Equipment            DME Agency: Other - Comment [facility]) Date DME Agency Contacted: 06/21/21 Time DME Agency Contacted: [phone] Representative spoke with at DME Agency: [place] ([place]) Interventions     Readmission Risk Interventions No flowsheet data found.

## 2021-06-21 NOTE — Plan of Care (Signed)
  Problem: Health Behavior/Discharge Planning: Goal: Ability to manage health-related needs will improve Outcome: Progressing   Problem: Clinical Measurements: Goal: Respiratory complications will improve Outcome: Progressing   Problem: Coping: Goal: Level of anxiety will decrease Outcome: Progressing   Problem: Pain Managment: Goal: General experience of comfort will improve Outcome: Progressing   

## 2021-06-21 NOTE — Plan of Care (Signed)
  Problem: Education: Goal: Ability to demonstrate management of disease process will improve Outcome: Adequate for Discharge Goal: Ability to verbalize understanding of medication therapies will improve Outcome: Adequate for Discharge Goal: Individualized Educational Video(s) Outcome: Adequate for Discharge   Problem: Cardiac: Goal: Ability to achieve and maintain adequate cardiopulmonary perfusion will improve Outcome: Adequate for Discharge   Problem: Education: Goal: Knowledge of disease or condition will improve 06/21/2021 1841 by Lennie Hummer, RN Outcome: Adequate for Discharge 06/21/2021 1836 by Lennie Hummer, RN Outcome: Progressing Goal: Understanding of discharge needs will improve 06/21/2021 1841 by Lennie Hummer, RN Outcome: Adequate for Discharge 06/21/2021 1836 by Lennie Hummer, RN Outcome: Progressing   Problem: Health Behavior/Discharge Planning: Goal: Ability to identify changes in lifestyle to reduce recurrence of condition will improve Outcome: Adequate for Discharge Goal: Identification of resources available to assist in meeting health care needs will improve Outcome: Adequate for Discharge   Problem: Physical Regulation: Goal: Complications related to the disease process, condition or treatment will be avoided or minimized Outcome: Adequate for Discharge   Problem: Safety: Goal: Ability to remain free from injury will improve Outcome: Adequate for Discharge   Problem: Education: Goal: Knowledge of General Education information will improve Description: Including pain rating scale, medication(s)/side effects and non-pharmacologic comfort measures Outcome: Adequate for Discharge   Problem: Health Behavior/Discharge Planning: Goal: Ability to manage health-related needs will improve Outcome: Adequate for Discharge   Problem: Clinical Measurements: Goal: Ability to maintain clinical measurements within normal limits will improve Outcome:  Adequate for Discharge Goal: Will remain free from infection 06/21/2021 1841 by Lennie Hummer, RN Outcome: Adequate for Discharge 06/21/2021 1836 by Lennie Hummer, RN Outcome: Adequate for Discharge Goal: Diagnostic test results will improve Outcome: Adequate for Discharge Goal: Respiratory complications will improve Outcome: Adequate for Discharge Goal: Cardiovascular complication will be avoided 06/21/2021 1841 by Lennie Hummer, RN Outcome: Adequate for Discharge 06/21/2021 1836 by Lennie Hummer, RN Outcome: Progressing   Problem: Activity: Goal: Risk for activity intolerance will decrease Outcome: Adequate for Discharge   Problem: Nutrition: Goal: Adequate nutrition will be maintained Outcome: Adequate for Discharge   Problem: Coping: Goal: Level of anxiety will decrease Outcome: Adequate for Discharge   Problem: Elimination: Goal: Will not experience complications related to bowel motility Outcome: Adequate for Discharge Goal: Will not experience complications related to urinary retention Outcome: Adequate for Discharge   Problem: Pain Managment: Goal: General experience of comfort will improve Outcome: Adequate for Discharge   Problem: Safety: Goal: Ability to remain free from injury will improve Outcome: Adequate for Discharge   Problem: Skin Integrity: Goal: Risk for impaired skin integrity will decrease Outcome: Adequate for Discharge

## 2021-06-21 NOTE — Discharge Summary (Signed)
Physician Discharge Summary  Jacob [name]:[id number] DOB: 1966-04-14 DOA: 06/17/2021  PCP: [name], MD  Admit date: 06/17/2021 Discharge date: 06/21/2021  Admitted From: Home Disposition: Home  Recommendations for Outpatient Follow-up:  Follow up with PCP in 1-2 weeks Follow-up with pulmonology Patient will need outpatient sleep study Please obtain BMP/CBC in one week Please follow up on the following pending results: High-resolution CT chest  Home Health: No Equipment/Devices: Home oxygen Discharge Condition: Stable CODE STATUS: Full Diet recommendation: Heart Healthy / Carb Modified   Brief/Interim Summary: Jacob [name] is a 55 y.o. male with medical history significant of HTN, DMII, HLD, prior cocaine and opiate abuse. Ongoing alcohol use. He presented to the hospital with worsening shortness of breath that has been slowly progressing for the past few weeks prior to admission.  He endorsed dyspnea on exertion, orthopnea, and worsening lower extremity edema. He admits to ongoing alcohol use with approximately half a pint of gin daily.  States that he has not done cocaine in approximately 9 months but has a history of severe cocaine use. He also continues to smoke cigarettes, approximately 0.5 PPD for approximately 40 years.   CXR on admission showed questionable pulmonary edema and vascular congestion.  BNP was elevated, 532.  Troponins were indeterminately elevated in the mid 20s.  He was mildly hypoxic, 87% on room air and placed on oxygen on admission. He was admitted for presumed new diagnosis CHF and started on IV Lasix and echo was ordered. His echo showed normal EF and no diastolic dysfunction so diastolic CHF ruled out.  Patient continues to have some exertional dyspnea, will need outpatient pulmonary evaluation for underlying lung disease as he was a heavy smoker, have bilateral scattered dry crackles, CT chest high-resolution was obtained before  discharge-pending results. Patient was also get benefit with outpatient sleep study to rule out OSA.  Patient continues to require up to 2 L of oxygen especially with ambulation, saturation above 90% while resting but desaturate with ambulation which might be contributory to his symptoms of exertional dyspnea.  Patient was counseled extensively against tobacco and alcohol abuse.  Patient requested nicotine patch which was provided.  Patient remained on CIWA protocol while in the hospital but did not express significant withdrawal symptoms.  Patient has an history of cocaine abuse, has been abstinent for approximately 9 months.  UDS was negative on admission.  Patient also follow-up with Suboxone clinic for his opioid use disorder.  Patient has uncontrolled diabetes mellitus, type II, A1c of 8% on admission.    He will continue his home medications and follow-up with his providers for further recommendations.   Discharge Diagnoses:  Principal Problem:   SOB (shortness of breath) Active Problems:   Essential hypertension   Acute respiratory failure with hypoxia (HCC)   Tobacco use   History of cocaine abuse (HCC)   DMII (diabetes mellitus, type 2) (HCC)   Alcohol use   Discharge Instructions  Discharge Instructions     Diet - low sodium heart healthy   Complete by: As directed    Discharge instructions   Complete by: As directed    It was pleasure taking care of you. You are being given oxygen to use whenever sleeping and walking around. You will use 2 L of oxygen, please follow-up with a pulmonologist for further recommendations. Please stop smoking to prevent rapid deterioration of your lungs. Keep yourself well-hydrated and follow-up with your doctors.   Increase activity slowly   Complete by:  As directed       Allergies as of 06/21/2021       Reactions   Abilify [aripiprazole] Other (See Comments)   Per patient "black outs" not sure what happens        Medication  List     STOP taking these medications    benztropine 1 MG tablet Commonly known as: COGENTIN   Breztri Aerosphere 160-9-4.8 MCG/ACT Aero Generic drug: Budeson-Glycopyrrol-Formoterol   doxepin 10 MG capsule Commonly known as: SINEQUAN   ibuprofen 800 MG tablet Commonly known as: ADVIL       TAKE these medications    albuterol 108 (90 Base) MCG/ACT inhaler Commonly known as: VENTOLIN HFA Inhale 2 puffs into the lungs 4 (four) times daily.   atorvastatin 10 MG tablet Commonly known as: LIPITOR Take 10 mg by mouth daily.   budesonide-formoterol 80-4.5 MCG/ACT inhaler Commonly known as: SYMBICORT Inhale 2 puffs into the lungs 2 (two) times daily.   Buprenorphine HCl-Naloxone HCl 4-1 MG Film Place 1 Film under the tongue See admin instructions. Order date 06/13/2021: Place one film under tongue every 12 hours for 15 days, then every 8 hours for 15 days   Buprenorphine HCl-Naloxone HCl 2-0.5 MG Film Place 1 Film under the tongue every 12 (twelve) hours.   cetirizine 10 MG tablet Commonly known as: ZYRTEC Take 10 mg by mouth daily.   cyclobenzaprine 10 MG tablet Commonly known as: FLEXERIL Take 10 mg by mouth every 12 (twelve) hours.   fluticasone 50 MCG/ACT nasal spray Commonly known as: FLONASE Place 1 spray into both nostrils daily.   folic acid 1 MG tablet Commonly known as: FOLVITE Take 1 mg by mouth daily.   glipiZIDE 5 MG tablet Commonly known as: GLUCOTROL Take 5 mg by mouth daily.   hydrOXYzine 25 MG capsule Commonly known as: VISTARIL Take 25 mg by mouth 3 (three) times daily.   losartan 25 MG tablet Commonly known as: COZAAR Take 25 mg by mouth daily.   metFORMIN 500 MG tablet Commonly known as: GLUCOPHAGE Take 500 mg by mouth 2 (two) times daily.   metoprolol tartrate 50 MG tablet Commonly known as: LOPRESSOR Take 50 mg by mouth 2 (two) times daily.   montelukast 10 MG tablet Commonly known as: SINGULAIR Take 10 mg by mouth at  bedtime.   naltrexone 50 MG tablet Commonly known as: DEPADE Take 50 mg by mouth daily.   nicotine 14 mg/24hr patch Commonly known as: NICODERM CQ - dosed in mg/24 hours Place 1 patch (14 mg total) onto the skin daily.   omeprazole 40 MG capsule Commonly known as: PRILOSEC Take 40 mg by mouth daily before breakfast.   QUEtiapine 100 MG tablet Commonly known as: SEROQUEL Take 200 mg by mouth at bedtime.   risperiDONE 3 MG tablet Commonly known as: RISPERDAL Take 3 mg by mouth at bedtime.   sildenafil 100 MG tablet Commonly known as: VIAGRA Take 100 mg by mouth daily.   sulindac 200 MG tablet Commonly known as: CLINORIL Take 200 mg by mouth 2 (two) times daily.   thiamine 100 MG tablet Take 1 tablet (100 mg total) by mouth daily. Start taking on: June 22, 2021   traMADol 50 MG tablet Commonly known as: ULTRAM Take 1 tablet (50 mg total) by mouth 2 (two) times daily.   venlafaxine XR 150 MG 24 hr capsule Commonly known as: EFFEXOR-XR Take 300 mg by mouth daily.       ASK your doctor about these  medications    diclofenac Sodium 1 % Gel Commonly known as: VOLTAREN APPY 2-4GM TOPICALLY FOUR TIMES A DAY AS NEEDED FOR PAIN               Durable Medical Equipment  (From admission, onward)           Start     Ordered   06/21/21 1519  For home use only DME oxygen  Once       Question Answer Comment  Length of Need Lifetime   Mode or (Route) Nasal cannula   Liters per Minute 2   Frequency Continuous (stationary and portable oxygen unit needed)   Oxygen conserving device Yes   Oxygen delivery system Gas      06/21/21 1518            Follow-up Information     Osei-Bonsu, Iona Beard, MD. Schedule an appointment as soon as possible for a visit in 1 week(s).   Specialty: Internal Medicine Why: Discuss having a sleep study at follow up visit to evaluate for sleep apnea Contact information: [address] [place] [address] [phone]                 Allergies  Allergen Reactions   Abilify [Aripiprazole] Other (See Comments)    Per patient "black outs" not sure what happens    Consultations: None  Procedures/Studies: DG Chest 2 View  Result Date: 06/17/2021 CLINICAL DATA:  Hypoxia, confusion, chest pain and altered mental status EXAM: CHEST - 2 VIEW COMPARISON:  Radiograph 08/31/2020 FINDINGS: Some hazy interstitial opacities are present in the mid to lower lungs. Slight pulmonary vascular congestion. No pneumothorax or visible effusion. Cardiomegaly with otherwise unremarkable cardiomediastinal contours. No acute osseous or soft tissue abnormality. IMPRESSION: Hazy opacities in the mid to lower lungs, could reflect atelectasis versus developing edema given pulmonary vascular congestion and cardiomegaly. Infection less favored though not fully excluded in the appropriate clinical context. Electronically Signed   By: Lovena Le M.D.   On: 06/17/2021 22:12   CT Head Wo Contrast  Result Date: 06/12/2021 CLINICAL DATA:  Dizziness and nausea for 1 week. Confusion. Decreased grip strength. EXAM: CT HEAD WITHOUT CONTRAST TECHNIQUE: Contiguous axial images were obtained from the base of the skull through the vertex without intravenous contrast. COMPARISON:  None. FINDINGS: Brain: No evidence of acute infarction, hemorrhage, hydrocephalus, extra-axial collection, or mass lesion/mass effect. Vascular:  No hyperdense vessel or other acute findings. Skull: No evidence of fracture or other significant bone abnormality. Sinuses/Orbits: New air-fluid level in sphenoid sinus, consistent with acute sinusitis. No osseous abnormality identified. Other: None. IMPRESSION: No intracranial abnormality identified. New sphenoid sinus air-fluid level, consistent with acute sinusitis. Electronically Signed   By: Marlaine Hind M.D.   On: 06/12/2021 06:55   ECHOCARDIOGRAM COMPLETE  Result Date: 06/18/2021    ECHOCARDIOGRAM REPORT   Patient Name:    CLEMON DEVAUL Date of Exam: 06/18/2021 Medical Rec #:  [id number]           Height:       73.0 in Accession #:    [id number]          Weight:       222.2 lb Date of Birth:  1966-08-19           BSA:          2.250 m Patient Age:    55 years            BP:  105/57 mmHg Patient Gender: M                   HR:           97 bpm. Exam Location:  Inpatient Procedure: 2D Echo, Cardiac Doppler and Color Doppler Indications:    I50.33 Acute on chronic diastolic (congestive) heart failure  History:        Patient has prior history of Echocardiogram examinations, most                 recent 07/17/2016. Stroke; Risk Factors:Hypertension, Diabetes                 and Dyslipidemia. Opioid abuse.  Sonographer:    Jonelle Sidle Dance Referring Phys: [place]  1. Left ventricular ejection fraction, by estimation, is 60 to 65%. The left ventricle has normal function. The left ventricle has no regional wall motion abnormalities. Left ventricular diastolic parameters were normal.  2. Right ventricular systolic function is normal. The right ventricular size is normal. Tricuspid regurgitation signal is inadequate for assessing PA pressure.  3. The mitral valve is normal in structure. Trivial mitral valve regurgitation. Mild mitral stenosis. MG 11mmHg at 97 bpm.  4. The aortic valve is tricuspid. Aortic valve regurgitation is mild. No aortic stenosis is present. FINDINGS  Left Ventricle: Left ventricular ejection fraction, by estimation, is 60 to 65%. The left ventricle has normal function. The left ventricle has no regional wall motion abnormalities. The left ventricular internal cavity size was normal in size. There is  no left ventricular hypertrophy. Left ventricular diastolic parameters were normal. Right Ventricle: The right ventricular size is normal. No increase in right ventricular wall thickness. Right ventricular systolic function is normal. Tricuspid regurgitation signal is inadequate for assessing  PA pressure. Left Atrium: Left atrial size was normal in size. Right Atrium: Right atrial size was normal in size. Pericardium: There is no evidence of pericardial effusion. Mitral Valve: The mitral valve is normal in structure. Trivial mitral valve regurgitation. Mild mitral valve stenosis. Tricuspid Valve: The tricuspid valve is normal in structure. Tricuspid valve regurgitation is trivial. Aortic Valve: The aortic valve is tricuspid. Aortic valve regurgitation is mild. Aortic regurgitation PHT measures 259 msec. No aortic stenosis is present. Pulmonic Valve: The pulmonic valve was not well visualized. Pulmonic valve regurgitation is not visualized. Aorta: The aortic root and ascending aorta are structurally normal, with no evidence of dilitation. IAS/Shunts: The interatrial septum was not well visualized.  LEFT VENTRICLE PLAX 2D LVIDd:         5.70 cm  Diastology LVIDs:         4.30 cm  LV e' medial:    6.96 cm/s LV PW:         1.50 cm  LV E/e' medial:  18.1 LV IVS:        1.20 cm  LV e' lateral:   10.40 cm/s LVOT diam:     2.10 cm  LV E/e' lateral: 12.1 LV SV:         55 LV SV Index:   24 LVOT Area:     3.46 cm  RIGHT VENTRICLE             IVC RV Basal diam:  2.90 cm     IVC diam: 1.70 cm RV S prime:     13.40 cm/s TAPSE (M-mode): 1.9 cm LEFT ATRIUM             Index  RIGHT ATRIUM           Index LA diam:        5.30 cm 2.36 cm/m  RA Area:     14.20 cm LA Vol (A2C):   59.6 ml 26.49 ml/m RA Volume:   35.70 ml  15.87 ml/m LA Vol (A4C):   70.0 ml 31.11 ml/m LA Biplane Vol: 68.3 ml 30.35 ml/m  AORTIC VALVE LVOT Vmax:   93.20 cm/s LVOT Vmean:  63.600 cm/s LVOT VTI:    0.159 m AI PHT:      259 msec  AORTA Ao Root diam: 3.90 cm Ao Asc diam:  3.50 cm MITRAL VALVE MV Area (PHT): 6.96 cm     SHUNTS MV Decel Time: 109 msec     Systemic VTI:  0.16 m MV E velocity: 126.00 cm/s  Systemic Diam: 2.10 cm MV A velocity: 73.50 cm/s MV E/A ratio:  1.71 Oswaldo Milian MD Electronically signed by Oswaldo Milian MD Signature Date/Time: 06/18/2021/12:26:35 PM    Final     Subjective: Patient was seen and examined today.  Stating that he is having good and bad days.  Patient continues to smoke, counseling was provided.  We also discussed about getting a sleep study as an outpatient.  He was requesting nicotine patch which was provided.  Discharge Exam: Vitals:   06/21/21 0643 06/21/21 1344  BP: (!) 151/91 128/90  Pulse: (!) 101 81  Resp: 16 (!) 21  Temp: 98.2 F (36.8 C) 97.7 F (36.5 C)  SpO2: 91% 95%   Vitals:   06/21/21 0300 06/21/21 0500 06/21/21 0643 06/21/21 1344  BP: (!) 141/96  (!) 151/91 128/90  Pulse: 95  (!) 101 81  Resp:   16 (!) 21  Temp: 98.1 F (36.7 C)  98.2 F (36.8 C) 97.7 F (36.5 C)  TempSrc: Oral  Oral Oral  SpO2: 90%  91% 95%  Weight:  95.4 kg    Height:        General: Pt is alert, awake, not in acute distress Cardiovascular: RRR, S1/S2 +, no rubs, no gallops Respiratory: Few scattered dry crackles bilaterally. Abdominal: Soft, NT, ND, bowel sounds + Extremities: no edema, no cyanosis   The results of significant diagnostics from this hospitalization (including imaging, microbiology, ancillary and laboratory) are listed below for reference.    Microbiology: Recent Results (from the past 240 hour(s))  Resp Panel by RT-PCR (Flu A&B, Covid) Nasopharyngeal Swab     Status: None   Collection Time: 06/17/21 11:57 PM   Specimen: Nasopharyngeal Swab; Nasopharyngeal(NP) swabs in vial transport medium  Result Value Ref Range Status   SARS Coronavirus 2 by RT PCR NEGATIVE NEGATIVE Final    Comment: (NOTE) SARS-CoV-2 target nucleic acids are NOT DETECTED.  The SARS-CoV-2 RNA is generally detectable in upper respiratory specimens during the acute phase of infection. The lowest concentration of SARS-CoV-2 viral copies this assay can detect is 138 copies/mL. A negative result does not preclude SARS-Cov-2 infection and should not be used as the sole basis  for treatment or other patient management decisions. A negative result may occur with  improper specimen collection/handling, submission of specimen other than nasopharyngeal swab, presence of viral mutation(s) within the areas targeted by this assay, and inadequate number of viral copies(<138 copies/mL). A negative result must be combined with clinical observations, patient history, and epidemiological information. The expected result is Negative.  Fact Sheet for Patients:  EntrepreneurPulse.com.au  Fact Sheet for Healthcare Providers:  IncredibleEmployment.be  This  test is no t yet approved or cleared by the [place] and  has been authorized for detection and/or diagnosis of SARS-CoV-2 by FDA under an Emergency Use Authorization (EUA). This EUA will remain  in effect (meaning this test can be used) for the duration of the COVID-19 declaration under Section 564(b)(1) of the Act, 21 U.S.C.section 360bbb-3(b)(1), unless the authorization is terminated  or revoked sooner.       Influenza A by PCR NEGATIVE NEGATIVE Final   Influenza B by PCR NEGATIVE NEGATIVE Final    Comment: (NOTE) The Xpert Xpress SARS-CoV-2/FLU/RSV plus assay is intended as an aid in the diagnosis of influenza from Nasopharyngeal swab specimens and should not be used as a sole basis for treatment. Nasal washings and aspirates are unacceptable for Xpert Xpress SARS-CoV-2/FLU/RSV testing.  Fact Sheet for Patients: EntrepreneurPulse.com.au  Fact Sheet for Healthcare Providers: IncredibleEmployment.be  This test is not yet approved or cleared by the [place] FDA and has been authorized for detection and/or diagnosis of SARS-CoV-2 by FDA under an Emergency Use Authorization (EUA). This EUA will remain in effect (meaning this test can be used) for the duration of the COVID-19 declaration under Section 564(b)(1) of the Act, 21  U.S.C. section 360bbb-3(b)(1), unless the authorization is terminated or revoked.  Performed at [facility], [place] [address] [place], [place] [address]      Labs: BNP (last 3 results) Recent Labs    06/17/21 2357  BNP 585.9*   Basic Metabolic Panel: Recent Labs  Lab 06/17/21 2357 06/18/21 0148 06/19/21 0330 06/20/21 0319 06/21/21 0315  NA 132*  --  137 136 138  K 4.2  --  3.3* 3.7 3.8  CL 97*  --  94* 93* 94*  CO2 25  --  34* 35* 35*  GLUCOSE 169*  --  112* 143* 131*  BUN 23*  --  11 13 17   CREATININE 1.18  --  0.64 0.84 0.73  CALCIUM 8.1*  --  8.1* 8.4* 8.9  MG  --  1.5* 1.2* 1.8 1.7  PHOS  --  2.8  --   --   --    Liver Function Tests: Recent Labs  Lab 06/17/21 2357  AST 29  ALT 41  ALKPHOS 103  BILITOT 0.8  PROT 7.1  ALBUMIN 3.5   Recent Labs  Lab 06/17/21 2357  LIPASE 23   Recent Labs  Lab 06/17/21 2357  AMMONIA 30   CBC: Recent Labs  Lab 06/17/21 2357 06/19/21 0330 06/20/21 0319 06/21/21 0315  WBC 10.1 7.9 8.9 9.3  NEUTROABS 6.2 4.3 4.8 5.1  HGB 12.5* 12.8* 13.0 13.9  HCT 41.5 41.7 42.6 46.1  MCV 93.5 92.7 92.8 93.5  PLT 324 286 274 288   Cardiac Enzymes: No results for input(s): CKTOTAL, CKMB, CKMBINDEX, TROPONINI in the last 168 hours. BNP: Invalid input(s): POCBNP CBG: Recent Labs  Lab 06/20/21 1145 06/20/21 1827 06/20/21 2111 06/21/21 0806 06/21/21 1139  GLUCAP 190* 230* 194* 157* 237*   D-Dimer No results for input(s): DDIMER in the last 72 hours. Hgb A1c No results for input(s): HGBA1C in the last 72 hours. Lipid Profile No results for input(s): CHOL, HDL, LDLCALC, TRIG, CHOLHDL, LDLDIRECT in the last 72 hours. Thyroid function studies No results for input(s): TSH, T4TOTAL, T3FREE, THYROIDAB in the last 72 hours.  Invalid input(s): FREET3 Anemia work up No results for input(s): VITAMINB12, FOLATE, FERRITIN, TIBC, IRON, RETICCTPCT in the last 72 hours. Urinalysis    Component  Value  Date/Time   COLORURINE AMBER (A) 06/17/2021 2151   APPEARANCEUR HAZY (A) 06/17/2021 2151   LABSPEC 1.026 06/17/2021 2151   PHURINE 5.0 06/17/2021 2151   GLUCOSEU NEGATIVE 06/17/2021 2151   HGBUR NEGATIVE 06/17/2021 2151   BILIRUBINUR NEGATIVE 06/17/2021 2151   KETONESUR NEGATIVE 06/17/2021 2151   PROTEINUR NEGATIVE 06/17/2021 2151   UROBILINOGEN 0.2 01/10/2015 1956   NITRITE NEGATIVE 06/17/2021 2151   LEUKOCYTESUR NEGATIVE 06/17/2021 2151   Sepsis Labs Invalid input(s): PROCALCITONIN,  WBC,  LACTICIDVEN Microbiology Recent Results (from the past 240 hour(s))  Resp Panel by RT-PCR (Flu A&B, Covid) Nasopharyngeal Swab     Status: None   Collection Time: 06/17/21 11:57 PM   Specimen: Nasopharyngeal Swab; Nasopharyngeal(NP) swabs in vial transport medium  Result Value Ref Range Status   SARS Coronavirus 2 by RT PCR NEGATIVE NEGATIVE Final    Comment: (NOTE) SARS-CoV-2 target nucleic acids are NOT DETECTED.  The SARS-CoV-2 RNA is generally detectable in upper respiratory specimens during the acute phase of infection. The lowest concentration of SARS-CoV-2 viral copies this assay can detect is 138 copies/mL. A negative result does not preclude SARS-Cov-2 infection and should not be used as the sole basis for treatment or other patient management decisions. A negative result may occur with  improper specimen collection/handling, submission of specimen other than nasopharyngeal swab, presence of viral mutation(s) within the areas targeted by this assay, and inadequate number of viral copies(<138 copies/mL). A negative result must be combined with clinical observations, patient history, and epidemiological information. The expected result is Negative.  Fact Sheet for Patients:  EntrepreneurPulse.com.au  Fact Sheet for Healthcare Providers:  IncredibleEmployment.be  This test is no t yet approved or cleared by the [place] FDA and  has been  authorized for detection and/or diagnosis of SARS-CoV-2 by FDA under an Emergency Use Authorization (EUA). This EUA will remain  in effect (meaning this test can be used) for the duration of the COVID-19 declaration under Section 564(b)(1) of the Act, 21 U.S.C.section 360bbb-3(b)(1), unless the authorization is terminated  or revoked sooner.       Influenza A by PCR NEGATIVE NEGATIVE Final   Influenza B by PCR NEGATIVE NEGATIVE Final    Comment: (NOTE) The Xpert Xpress SARS-CoV-2/FLU/RSV plus assay is intended as an aid in the diagnosis of influenza from Nasopharyngeal swab specimens and should not be used as a sole basis for treatment. Nasal washings and aspirates are unacceptable for Xpert Xpress SARS-CoV-2/FLU/RSV testing.  Fact Sheet for Patients: EntrepreneurPulse.com.au  Fact Sheet for Healthcare Providers: IncredibleEmployment.be  This test is not yet approved or cleared by the [place] FDA and has been authorized for detection and/or diagnosis of SARS-CoV-2 by FDA under an Emergency Use Authorization (EUA). This EUA will remain in effect (meaning this test can be used) for the duration of the COVID-19 declaration under Section 564(b)(1) of the Act, 21 U.S.C. section 360bbb-3(b)(1), unless the authorization is terminated or revoked.  Performed at [facility], [place] [address] [place], [place] [address]     Time coordinating discharge: Over 30 minutes  SIGNED:  Lorella Nimrod, MD  [facility] 06/21/2021, 3:27 PM  If 7PM-7AM, please contact night-coverage [email or web address]  This record has been created using Systems analyst. Errors have been sought and corrected,but may not always be located. Such creation errors do not reflect on the standard of care.

## 2021-06-21 NOTE — Plan of Care (Signed)
  Problem: Education: Goal: Knowledge of disease or condition will improve Outcome: Progressing Goal: Understanding of discharge needs will improve Outcome: Progressing   Problem: Clinical Measurements: Goal: Cardiovascular complication will be avoided Outcome: Progressing   Problem: Clinical Measurements: Goal: Will remain free from infection Outcome: Adequate for Discharge

## 2021-06-21 NOTE — Progress Notes (Signed)
SATURATION QUALIFICATIONS: (This note is used to comply with regulatory documentation for home oxygen)  Patient Saturations on Room Air at Rest = 96%  Patient Saturations on Room Air while Ambulating = 81%  Patient Saturations on 2 Liters of oxygen while Ambulating = 86%  Please briefly explain why patient needs home oxygen:  Pt desat 81 % while ambulating with continuous pulse oximetry w/o oxygen....81% show no signs of distress

## 2021-07-12 DIAGNOSIS — M25562 Pain in left knee: Secondary | ICD-10-CM | POA: Diagnosis not present

## 2021-07-12 DIAGNOSIS — G894 Chronic pain syndrome: Secondary | ICD-10-CM | POA: Diagnosis not present

## 2021-07-12 DIAGNOSIS — M25512 Pain in left shoulder: Secondary | ICD-10-CM | POA: Diagnosis not present

## 2021-08-09 DIAGNOSIS — G894 Chronic pain syndrome: Secondary | ICD-10-CM | POA: Diagnosis not present

## 2021-08-09 DIAGNOSIS — M25512 Pain in left shoulder: Secondary | ICD-10-CM | POA: Diagnosis not present

## 2021-08-09 DIAGNOSIS — Z79891 Long term (current) use of opiate analgesic: Secondary | ICD-10-CM | POA: Diagnosis not present

## 2021-08-09 DIAGNOSIS — M25562 Pain in left knee: Secondary | ICD-10-CM | POA: Diagnosis not present

## 2021-08-23 DIAGNOSIS — I469 Cardiac arrest, cause unspecified: Secondary | ICD-10-CM | POA: Diagnosis not present

## 2021-08-23 DIAGNOSIS — R404 Transient alteration of awareness: Secondary | ICD-10-CM | POA: Diagnosis not present

## 2021-08-25 DIAGNOSIS — 419620001 Death: Secondary | SNOMED CT | POA: Diagnosis not present

## 2021-08-25 DEATH — deceased
